# Patient Record
Sex: Female | Born: 1965 | Race: White | Hispanic: No | Marital: Married | Smoking: Never smoker
Health system: Southern US, Community
[De-identification: ages and names within clinical notes are randomized; demographics above are authoritative.]

## PROBLEM LIST (undated history)

## (undated) DIAGNOSIS — M1990 Unspecified osteoarthritis, unspecified site: Secondary | ICD-10-CM

## (undated) DIAGNOSIS — E669 Obesity, unspecified: Secondary | ICD-10-CM

## (undated) DIAGNOSIS — B081 Molluscum contagiosum: Secondary | ICD-10-CM

## (undated) DIAGNOSIS — IMO0002 Reserved for concepts with insufficient information to code with codable children: Secondary | ICD-10-CM

## (undated) DIAGNOSIS — M778 Other enthesopathies, not elsewhere classified: Secondary | ICD-10-CM

## (undated) DIAGNOSIS — K824 Cholesterolosis of gallbladder: Secondary | ICD-10-CM

## (undated) DIAGNOSIS — R569 Unspecified convulsions: Secondary | ICD-10-CM

## (undated) DIAGNOSIS — M797 Fibromyalgia: Secondary | ICD-10-CM

## (undated) DIAGNOSIS — M722 Plantar fascial fibromatosis: Secondary | ICD-10-CM

## (undated) DIAGNOSIS — G8929 Other chronic pain: Secondary | ICD-10-CM

## (undated) DIAGNOSIS — M8580 Other specified disorders of bone density and structure, unspecified site: Secondary | ICD-10-CM

## (undated) DIAGNOSIS — R519 Headache, unspecified: Secondary | ICD-10-CM

## (undated) DIAGNOSIS — I341 Nonrheumatic mitral (valve) prolapse: Secondary | ICD-10-CM

## (undated) DIAGNOSIS — K219 Gastro-esophageal reflux disease without esophagitis: Secondary | ICD-10-CM

## (undated) DIAGNOSIS — M7070 Other bursitis of hip, unspecified hip: Secondary | ICD-10-CM

## (undated) DIAGNOSIS — D6851 Activated protein C resistance: Secondary | ICD-10-CM

## (undated) DIAGNOSIS — J342 Deviated nasal septum: Secondary | ICD-10-CM

## (undated) HISTORY — DX: Other chronic pain: G89.29

## (undated) HISTORY — DX: Plantar fascial fibromatosis: M72.2

## (undated) HISTORY — DX: Other bursitis of hip, unspecified hip: M70.70

## (undated) HISTORY — DX: Other enthesopathies, not elsewhere classified: M77.8

## (undated) HISTORY — DX: Fibromyalgia: M79.7

## (undated) HISTORY — DX: Unspecified osteoarthritis, unspecified site: M19.90

## (undated) HISTORY — DX: Activated protein C resistance: D68.51

## (undated) HISTORY — DX: Molluscum contagiosum: B08.1

## (undated) HISTORY — PX: WISDOM TOOTH EXTRACTION: SHX21

## (undated) HISTORY — DX: Deviated nasal septum: J34.2

## (undated) HISTORY — DX: Cholesterolosis of gallbladder: K82.4

## (undated) HISTORY — DX: Nonrheumatic mitral (valve) prolapse: I34.1

## (undated) HISTORY — DX: Unspecified convulsions: R56.9

## (undated) HISTORY — DX: Headache, unspecified: R51.9

## (undated) HISTORY — DX: Other specified disorders of bone density and structure, unspecified site: M85.80

## (undated) HISTORY — DX: Reserved for concepts with insufficient information to code with codable children: IMO0002

## (undated) HISTORY — DX: Obesity, unspecified: E66.9

---

## 1986-07-24 HISTORY — PX: TEMPOROMANDIBULAR JOINT SURGERY: SHX35

## 1998-05-08 ENCOUNTER — Ambulatory Visit (HOSPITAL_COMMUNITY): Admission: RE | Admit: 1998-05-08 | Discharge: 1998-05-08 | Payer: Self-pay | Admitting: Obstetrics and Gynecology

## 1999-03-14 ENCOUNTER — Inpatient Hospital Stay (HOSPITAL_COMMUNITY): Admission: AD | Admit: 1999-03-14 | Discharge: 1999-03-17 | Payer: Self-pay | Admitting: Gynecology

## 1999-03-14 ENCOUNTER — Encounter (INDEPENDENT_AMBULATORY_CARE_PROVIDER_SITE_OTHER): Payer: Self-pay

## 1999-04-26 ENCOUNTER — Other Ambulatory Visit: Admission: RE | Admit: 1999-04-26 | Discharge: 1999-04-26 | Payer: Self-pay | Admitting: Gynecology

## 2000-06-13 ENCOUNTER — Other Ambulatory Visit: Admission: RE | Admit: 2000-06-13 | Discharge: 2000-06-13 | Payer: Self-pay | Admitting: Family Medicine

## 2001-07-16 ENCOUNTER — Other Ambulatory Visit: Admission: RE | Admit: 2001-07-16 | Discharge: 2001-07-16 | Payer: Self-pay | Admitting: Family Medicine

## 2002-07-08 ENCOUNTER — Encounter: Payer: Self-pay | Admitting: Family Medicine

## 2002-07-08 ENCOUNTER — Ambulatory Visit (HOSPITAL_COMMUNITY): Admission: RE | Admit: 2002-07-08 | Discharge: 2002-07-08 | Payer: Self-pay | Admitting: Family Medicine

## 2002-10-27 ENCOUNTER — Ambulatory Visit (HOSPITAL_COMMUNITY): Admission: RE | Admit: 2002-10-27 | Discharge: 2002-10-27 | Payer: Self-pay | Admitting: Family Medicine

## 2002-10-27 ENCOUNTER — Encounter: Payer: Self-pay | Admitting: Family Medicine

## 2002-11-04 ENCOUNTER — Encounter: Admission: RE | Admit: 2002-11-04 | Discharge: 2002-11-04 | Payer: Self-pay | Admitting: Family Medicine

## 2002-11-04 ENCOUNTER — Encounter: Payer: Self-pay | Admitting: Family Medicine

## 2003-04-28 ENCOUNTER — Encounter: Admission: RE | Admit: 2003-04-28 | Discharge: 2003-04-28 | Payer: Self-pay | Admitting: Family Medicine

## 2003-04-28 ENCOUNTER — Encounter: Payer: Self-pay | Admitting: Family Medicine

## 2003-11-10 ENCOUNTER — Encounter: Admission: RE | Admit: 2003-11-10 | Discharge: 2003-11-10 | Payer: Self-pay | Admitting: Family Medicine

## 2003-11-26 ENCOUNTER — Other Ambulatory Visit: Admission: RE | Admit: 2003-11-26 | Discharge: 2003-11-26 | Payer: Self-pay | Admitting: Family Medicine

## 2004-11-10 ENCOUNTER — Encounter: Admission: RE | Admit: 2004-11-10 | Discharge: 2004-11-10 | Payer: Self-pay | Admitting: Family Medicine

## 2004-12-08 ENCOUNTER — Other Ambulatory Visit: Admission: RE | Admit: 2004-12-08 | Discharge: 2004-12-08 | Payer: Self-pay | Admitting: Family Medicine

## 2005-01-22 ENCOUNTER — Emergency Department: Payer: Self-pay | Admitting: Unknown Physician Specialty

## 2005-11-14 ENCOUNTER — Encounter: Admission: RE | Admit: 2005-11-14 | Discharge: 2005-11-14 | Payer: Self-pay | Admitting: Family Medicine

## 2005-12-22 ENCOUNTER — Other Ambulatory Visit: Admission: RE | Admit: 2005-12-22 | Discharge: 2005-12-22 | Payer: Self-pay | Admitting: Family Medicine

## 2005-12-26 ENCOUNTER — Other Ambulatory Visit: Admission: RE | Admit: 2005-12-26 | Discharge: 2005-12-26 | Payer: Self-pay | Admitting: Obstetrics & Gynecology

## 2006-11-20 ENCOUNTER — Encounter: Admission: RE | Admit: 2006-11-20 | Discharge: 2006-11-20 | Payer: Self-pay | Admitting: Family Medicine

## 2006-12-27 ENCOUNTER — Other Ambulatory Visit: Admission: RE | Admit: 2006-12-27 | Discharge: 2006-12-27 | Payer: Self-pay | Admitting: Obstetrics & Gynecology

## 2007-01-03 ENCOUNTER — Ambulatory Visit (HOSPITAL_COMMUNITY): Admission: RE | Admit: 2007-01-03 | Discharge: 2007-01-03 | Payer: Self-pay | Admitting: Obstetrics & Gynecology

## 2007-01-17 ENCOUNTER — Encounter: Admission: RE | Admit: 2007-01-17 | Discharge: 2007-01-17 | Payer: Self-pay | Admitting: Obstetrics & Gynecology

## 2007-11-25 ENCOUNTER — Encounter: Admission: RE | Admit: 2007-11-25 | Discharge: 2007-11-25 | Payer: Self-pay | Admitting: Family Medicine

## 2008-01-15 ENCOUNTER — Other Ambulatory Visit: Admission: RE | Admit: 2008-01-15 | Discharge: 2008-01-15 | Payer: Self-pay | Admitting: Obstetrics and Gynecology

## 2008-11-25 ENCOUNTER — Encounter: Admission: RE | Admit: 2008-11-25 | Discharge: 2008-11-25 | Payer: Self-pay | Admitting: Family Medicine

## 2010-01-12 ENCOUNTER — Encounter: Admission: RE | Admit: 2010-01-12 | Discharge: 2010-01-12 | Payer: Self-pay | Admitting: Family Medicine

## 2010-02-08 ENCOUNTER — Emergency Department (HOSPITAL_COMMUNITY): Admission: EM | Admit: 2010-02-08 | Discharge: 2010-02-08 | Payer: Self-pay | Admitting: Emergency Medicine

## 2010-08-14 ENCOUNTER — Encounter: Payer: Self-pay | Admitting: Family Medicine

## 2010-10-08 LAB — POCT CARDIAC MARKERS
CKMB, poc: <1 ng/mL — ABNORMAL LOW (ref 1.0–8.0)
CKMB, poc: <1 ng/mL — ABNORMAL LOW (ref 1.0–8.0)
CKMB, poc: <1 ng/mL — ABNORMAL LOW (ref 1.0–8.0)
Myoglobin, poc: 28 ng/mL (ref 12–200)
Myoglobin, poc: 51.7 ng/mL (ref 12–200)
Myoglobin, poc: 58.6 ng/mL (ref 12–200)
Troponin i, poc: <0.05 ng/mL (ref 0.00–0.09)
Troponin i, poc: <0.05 ng/mL (ref 0.00–0.09)
Troponin i, poc: <0.05 ng/mL (ref 0.00–0.09)

## 2010-10-08 LAB — CBC
HCT: 43.1 % (ref 36.0–46.0)
Hemoglobin: 14.7 g/dL (ref 12.0–15.0)
MCH: 31.8 pg (ref 26.0–34.0)
MCHC: 34.2 g/dL (ref 30.0–36.0)
MCV: 93.1 fL (ref 78.0–100.0)
Platelets: 261 10*3/uL (ref 150–400)
RBC: 4.63 MIL/uL (ref 3.87–5.11)
RDW: 13.1 % (ref 11.5–15.5)
WBC: 6.9 10*3/uL (ref 4.0–10.5)

## 2010-10-08 LAB — DIFFERENTIAL
Basophils Absolute: 0.1 10*3/uL (ref 0.0–0.1)
Basophils Relative: 1 % (ref 0–1)
Eosinophils Absolute: 0.1 10*3/uL (ref 0.0–0.7)
Eosinophils Relative: 1 % (ref 0–5)
Lymphocytes Relative: 16 % (ref 12–46)
Lymphs Abs: 1.1 10*3/uL (ref 0.7–4.0)
Monocytes Absolute: 0.4 10*3/uL (ref 0.1–1.0)
Monocytes Relative: 6 % (ref 3–12)
Neutro Abs: 5.2 10*3/uL (ref 1.7–7.7)
Neutrophils Relative %: 75 % (ref 43–77)

## 2010-10-08 LAB — BASIC METABOLIC PANEL
BUN: 13 mg/dL (ref 6–23)
CO2: 21 meq/L (ref 19–32)
Calcium: 8.9 mg/dL (ref 8.4–10.5)
Chloride: 109 meq/L (ref 96–112)
Creatinine, Ser: 0.69 mg/dL (ref 0.4–1.2)
GFR calc Af Amer: >60 mL/min (ref >60)
GFR calc non Af Amer: >60 mL/min (ref >60)
Glucose, Bld: 93 mg/dL (ref 70–99)
Potassium: 4.2 meq/L (ref 3.5–5.1)
Sodium: 137 meq/L (ref 135–145)

## 2010-10-08 LAB — D-DIMER, QUANTITATIVE: D-Dimer, Quant: 0.4 ug/mL-FEU (ref 0.00–0.48)

## 2010-12-20 ENCOUNTER — Emergency Department (HOSPITAL_COMMUNITY): Admission: EM | Admit: 2010-12-20 | Payer: Self-pay | Source: Home / Self Care

## 2010-12-20 ENCOUNTER — Other Ambulatory Visit: Payer: Self-pay | Admitting: Family Medicine

## 2010-12-20 DIAGNOSIS — Z1231 Encounter for screening mammogram for malignant neoplasm of breast: Secondary | ICD-10-CM

## 2011-01-16 ENCOUNTER — Ambulatory Visit
Admission: RE | Admit: 2011-01-16 | Discharge: 2011-01-16 | Disposition: A | Discharge status: Home / Self Care | Payer: BC Managed Care – PPO | Source: Ambulatory Visit | Attending: Family Medicine | Admitting: Family Medicine

## 2011-01-16 DIAGNOSIS — Z1231 Encounter for screening mammogram for malignant neoplasm of breast: Secondary | ICD-10-CM

## 2011-04-04 ENCOUNTER — Encounter: Payer: BC Managed Care – PPO | Attending: Obstetrics and Gynecology | Admitting: *Deleted

## 2011-04-04 ENCOUNTER — Encounter: Payer: Self-pay | Admitting: *Deleted

## 2011-04-04 DIAGNOSIS — Z713 Dietary counseling and surveillance: Secondary | ICD-10-CM | POA: Insufficient documentation

## 2011-04-04 DIAGNOSIS — E669 Obesity, unspecified: Secondary | ICD-10-CM | POA: Insufficient documentation

## 2011-04-04 NOTE — Patient Instructions (Addendum)
Goals:  Eat 3 meals/day, Avoid meal skipping   Increase protein rich foods  Count carbohydrates using carbohydrate exchanges  Limit carbohydrate 30-45 grams/meal, 15-20 grams/snack   Aim for >30 min of physical activity daily  Limit sugar-sweetened beverages and concentrated sweets

## 2011-04-04 NOTE — Progress Notes (Signed)
  Medical Nutrition Therapy:  Appt start time: 0930 end time:  1030.   Assessment:  Primary concerns today: weight management. Pt reports that her usual body weight is 145 lbs yet in the last 6 years she has gained about 55 lbs. Pt reports that she has not participated in any notable weight loss methods (diets, pills, etc). She reports that she previously had been able to manage her weight with being active and exercise yet due to her busy schedule she is unable to do so. She works PT as an Airline pilot.  MEDICATIONS: See updated medication list. Pt takes medication for allergies, GERD, and pain (prn) as well as supplements.   DIETARY INTAKE:  Usual eating pattern includes 2-3 meals and 0-1 snacks per day.  24-hr recall:  B (9-9:30 AM): Nutrigrain bar  Snk (AM): N/A  L (PM): SKIPS (75%) OR Grapes, Side Salad OR Kid's Meal Cheeseburger w/ Donzetta Sprung Snk (PM): N/A D (6-9 PM): [employer] Veggie Plate OR Steak Kabob w/ vegetables OR Grilled chicken, potatoes, salad, green beans Snk (PM): SKIPS OR Popcorn Beverages: Water, Sweet Tea  Usual physical activity: Low activity levels yet due to bursitis she is unable to exercise much on her treadmill  Estimated energy needs: 1600-1800 calories 180-200 g carbohydrates 80-100 g protein 50-60 g fat  Per referring physician labs reported as follows: Cholesterol: 189 LDL: 110 HDL: 62 TG: 43  Progress Towards Goal(s):  In progress.   Nutritional Diagnosis:  NI-1.4 Inadequate energy intake As related to frequent meal skipping.  As evidenced by pt consuming <500 calories per day.    Intervention:  Nutrition education.  Handouts given during visit include:  Meal Planner; "Yellow Card"  15 gram Carbohydrate Snack Sheet  Monitoring/Evaluation:  Dietary intake, exercise, carbohydrate intake, and body weight in 1 month(s).

## 2011-05-04 ENCOUNTER — Ambulatory Visit: Payer: BC Managed Care – PPO | Admitting: *Deleted

## 2011-05-11 LAB — BLEEDING TIME: Bleeding Time: 2.5

## 2011-05-11 LAB — CBC
HCT: 40.3
Hemoglobin: 13.9
MCHC: 34.4
MCV: 90.4
Platelets: 231
RBC: 4.46
RDW: 12.8
WBC: 5.2

## 2011-05-11 LAB — TSH: TSH: 0.858

## 2011-07-25 DIAGNOSIS — M7918 Myalgia, other site: Secondary | ICD-10-CM

## 2011-07-25 DIAGNOSIS — M797 Fibromyalgia: Secondary | ICD-10-CM

## 2011-07-25 HISTORY — DX: Myalgia, other site: M79.18

## 2011-07-25 HISTORY — DX: Fibromyalgia: M79.7

## 2012-01-05 ENCOUNTER — Other Ambulatory Visit: Payer: Self-pay | Admitting: Family Medicine

## 2012-01-05 DIAGNOSIS — Z1231 Encounter for screening mammogram for malignant neoplasm of breast: Secondary | ICD-10-CM

## 2012-01-19 ENCOUNTER — Ambulatory Visit: Payer: BC Managed Care – PPO

## 2012-01-22 ENCOUNTER — Ambulatory Visit: Payer: BC Managed Care – PPO

## 2012-01-29 ENCOUNTER — Ambulatory Visit
Admission: RE | Admit: 2012-01-29 | Discharge: 2012-01-29 | Disposition: A | Discharge status: Home / Self Care | Payer: BC Managed Care – PPO | Source: Ambulatory Visit | Attending: Family Medicine | Admitting: Family Medicine

## 2012-01-29 DIAGNOSIS — Z1231 Encounter for screening mammogram for malignant neoplasm of breast: Secondary | ICD-10-CM

## 2012-01-29 LAB — HM MAMMOGRAPHY: HM Mammogram: NORMAL

## 2012-03-24 DIAGNOSIS — IMO0002 Reserved for concepts with insufficient information to code with codable children: Secondary | ICD-10-CM

## 2012-03-24 HISTORY — DX: Reserved for concepts with insufficient information to code with codable children: IMO0002

## 2012-11-06 ENCOUNTER — Encounter: Payer: Self-pay | Admitting: Obstetrics & Gynecology

## 2012-11-06 ENCOUNTER — Ambulatory Visit: Payer: Self-pay | Admitting: Obstetrics & Gynecology

## 2012-11-07 ENCOUNTER — Ambulatory Visit: Payer: Self-pay | Admitting: Obstetrics & Gynecology

## 2012-11-15 ENCOUNTER — Encounter: Payer: Self-pay | Admitting: Obstetrics & Gynecology

## 2012-11-19 ENCOUNTER — Ambulatory Visit: Payer: Self-pay | Admitting: Obstetrics & Gynecology

## 2012-11-20 ENCOUNTER — Ambulatory Visit (INDEPENDENT_AMBULATORY_CARE_PROVIDER_SITE_OTHER): Payer: BC Managed Care – PPO | Admitting: Obstetrics & Gynecology

## 2012-11-20 ENCOUNTER — Ambulatory Visit: Payer: Self-pay | Admitting: Obstetrics & Gynecology

## 2012-11-20 ENCOUNTER — Encounter: Payer: Self-pay | Admitting: Obstetrics & Gynecology

## 2012-11-20 VITALS — BP 104/76 | HR 40 | Resp 16

## 2012-11-20 DIAGNOSIS — R87810 Cervical high risk human papillomavirus (HPV) DNA test positive: Secondary | ICD-10-CM

## 2012-11-20 DIAGNOSIS — N870 Mild cervical dysplasia: Secondary | ICD-10-CM

## 2012-11-20 NOTE — Progress Notes (Signed)
46 yrs  MWF G3P0012here for repeat pap smear due to h/o +HR HPV.  +HR HPV noted on screening pap smear in 9/13.  Pap was normal.  16/18 testing was done.  18 was positive.  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  ECC was negative.   Denies problems or vaginal symptoms or STD concerns.  LMP:  Having perimenopausal bleeding with just occasional spotting. Contraception:vasectomy  General appearance: Healthy female    Exam:Pelvic exam: normal external genitalia, vulva, vagina, cervix, uterus and adnexa, VULVA: normal appearing vulva with no masses, tenderness or lesions, VAGINA: normal appearing vagina with normal color and discharge, no lesions, CERVIX: normal appearing cervix without discharge or lesions, UTERUS: uterus is normal size, shape, consistency and nontender, ADNEXA: normal adnexa in size, nontender and no masses.Pap smear obtained.  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  If negative, will repeat Pap and HR HPV in six months around time of [facility].   Marland Kitchen

## 2012-11-20 NOTE — Patient Instructions (Signed)
We will call with Pap smear results and recommendations for follow-up.

## 2012-11-22 LAB — IPS PAP SMEAR ONLY

## 2012-12-30 ENCOUNTER — Other Ambulatory Visit: Payer: Self-pay

## 2012-12-30 DIAGNOSIS — Z1231 Encounter for screening mammogram for malignant neoplasm of breast: Secondary | ICD-10-CM

## 2013-01-30 ENCOUNTER — Ambulatory Visit
Admission: RE | Admit: 2013-01-30 | Discharge: 2013-01-30 | Disposition: A | Discharge status: Home / Self Care | Payer: BC Managed Care – PPO | Source: Ambulatory Visit

## 2013-01-30 DIAGNOSIS — Z1231 Encounter for screening mammogram for malignant neoplasm of breast: Secondary | ICD-10-CM

## 2013-04-15 ENCOUNTER — Ambulatory Visit: Payer: Self-pay | Admitting: Certified Nurse Midwife

## 2013-05-02 ENCOUNTER — Ambulatory Visit (INDEPENDENT_AMBULATORY_CARE_PROVIDER_SITE_OTHER): Payer: BC Managed Care – PPO | Admitting: Obstetrics & Gynecology

## 2013-05-02 ENCOUNTER — Encounter: Payer: Self-pay | Admitting: Obstetrics & Gynecology

## 2013-05-02 VITALS — BP 118/74 | HR 58 | Resp 16 | Ht 67.75 in | Wt 206.4 lb

## 2013-05-02 DIAGNOSIS — Z0000 Encounter for general adult medical examination without abnormal findings: Secondary | ICD-10-CM

## 2013-05-02 DIAGNOSIS — B977 Papillomavirus as the cause of diseases classified elsewhere: Secondary | ICD-10-CM

## 2013-05-02 DIAGNOSIS — Z1211 Encounter for screening for malignant neoplasm of colon: Secondary | ICD-10-CM

## 2013-05-02 DIAGNOSIS — Z01419 Encounter for gynecological examination (general) (routine) without abnormal findings: Secondary | ICD-10-CM

## 2013-05-02 DIAGNOSIS — Z124 Encounter for screening for malignant neoplasm of cervix: Secondary | ICD-10-CM

## 2013-05-02 LAB — COMPREHENSIVE METABOLIC PANEL
ALT: 14 U/L (ref 0–35)
AST: 15 U/L (ref 0–37)
Albumin: 4.1 g/dL (ref 3.5–5.2)
Alkaline Phosphatase: 151 U/L — ABNORMAL HIGH (ref 39–117)
BUN: 15 mg/dL (ref 6–23)
CO2: 30 mEq/L (ref 19–32)
Calcium: 9 mg/dL (ref 8.4–10.5)
Chloride: 106 meq/L (ref 96–112)
Creat: 0.79 mg/dL (ref 0.50–1.10)
Glucose, Bld: 73 mg/dL (ref 70–99)
Potassium: 3.8 meq/L (ref 3.5–5.3)
Sodium: 140 mEq/L (ref 135–145)
Total Bilirubin: 0.6 mg/dL (ref 0.3–1.2)
Total Protein: 6.5 g/dL (ref 6.0–8.3)

## 2013-05-02 LAB — LIPID PANEL
Cholesterol: 182 mg/dL (ref 0–200)
HDL: 58 mg/dL (ref >39)
LDL Cholesterol: 111 mg/dL — ABNORMAL HIGH (ref 0–99)
Total CHOL/HDL Ratio: 3.1 {ratio}
Triglycerides: 66 mg/dL (ref <150)
VLDL: 13 mg/dL (ref 0–40)

## 2013-05-02 LAB — TSH: TSH: 0.7 u[IU]/mL (ref 0.350–4.500)

## 2013-05-02 MED ORDER — VITAMIN D (ERGOCALCIFEROL) 1.25 MG (50000 UNIT) PO CAPS: 50000.0000 [IU] | ORAL_CAPSULE | ORAL | Status: DC

## 2013-05-02 NOTE — Patient Instructions (Signed)

## 2013-05-02 NOTE — Progress Notes (Addendum)
47 y.o. [id number] MarriedCaucasianF here for annual exam.  Had spotting in May and June.  She reports this was a little bit more like a period.  Lasted for 7 days.  Flow was moderate.  Endometrial biopsy 11/13 was inactive endometrium--benign.  H/O abnormal Pap with +16/18.  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  Sister with pre-cancerous colon polyps and had resection of 12 inches of colon.  She is 46.  Saw Dr. Juanda Chance and Dr. Michaell Cowing.    Patient's last menstrual period was 12/22/2012.          Sexually active: yes  The current method of family planning is vasectomy.    Exercising: yes  some, diag with bilateral hip bursitis Smoker:  no  Health Maintenance: Pap:  04/10/12 WNL/positive HR HPV-18 detected History of abnormal Pap:  yes MMG: 01/30/13 3D normal Colonoscopy:  none BMD:   none TDaP:  7/06 Screening Labs: today, Hb today: 13.6, Urine today: negative   reports that she has never smoked. She has never used smokeless tobacco. She reports that she drinks alcohol. She reports that she does not use illicit drugs.  Past Medical History  Diagnosis Date  . Obesity   . Factor V Leiden   . Molluscum contagiosum   . HPV test positive 9/13    neg Pap, +16/18 HPV  . Plantar fasciitis   . Tendonitis of elbow, right   . Fibromyalgia 2013  . Bursitis of hip     bilateral    Past Surgical History  Procedure Laterality Date  . Cesarean section  1997, 2000    Current Outpatient Prescriptions  Medication Sig Dispense Refill  . cetirizine (ZYRTEC) 10 MG tablet Take 10 mg by mouth daily.        . methocarbamol (ROBAXIN) 500 MG tablet Take 500 mg by mouth 2 (two) times daily.      . Multiple Vitamins-Minerals (MULTIVITAMIN WITH MINERALS) tablet Take 1 tablet by mouth daily.        . nadolol (CORGARD) 40 MG tablet Take 40 mg by mouth daily.        Marland Kitchen omeprazole (PRILOSEC) 20 MG capsule Take 20 mg by mouth daily.        . Vitamin D, Ergocalciferol, (DRISDOL) 50000 UNITS CAPS Take 50,000 Units by  mouth every 14 (fourteen) days.      Marland Kitchen b complex vitamins tablet Take 1 tablet by mouth daily.        . cyclobenzaprine (FLEXERIL) 10 MG tablet Take 10 mg by mouth 3 (three) times daily as needed.        Marland Kitchen ibuprofen (ADVIL,MOTRIN) 100 MG/5ML suspension Take 200 mg by mouth every 8 (eight) hours as needed.        . traMADol (ULTRAM) 50 MG tablet Take 50 mg by mouth every 6 (six) hours as needed.         No current facility-administered medications for this visit.    Family History  Problem Relation Age of Onset  . Colon cancer Sister     pre-colon cancer-had surgery  . Heart disease Sister     pacemaker, POT syndrome  . Diabetes Father   . COPD Mother   . Rheum arthritis Mother   . Hypertension Father   . Hyperlipidemia Father   . Hyperlipidemia Mother     ROS:  Pertinent items are noted in HPI.  Otherwise, a comprehensive ROS was negative.  Exam:   BP 118/74  Pulse 58  Resp 16  Ht  5' 7.75" (1.721 m)  Wt 206 lb 6.4 oz (93.622 kg)  BMI 31.61 kg/m2  LMP 12/22/2012  Weight change: +6lb   Height: 5' 7.75" (172.1 cm)  Ht Readings from Last 3 Encounters:  05/02/13 5' 7.75" (1.721 m)  04/04/11 5' 7.5" (1.715 m)    General appearance: alert, cooperative and appears stated age Head: Normocephalic, without obvious abnormality, atraumatic Neck: no adenopathy, supple, symmetrical, trachea midline and thyroid normal to inspection and palpation Lungs: clear to auscultation bilaterally Breasts: normal appearance, no masses or tenderness Heart: regular rate and rhythm Abdomen: soft, non-tender; bowel sounds normal; no masses,  no organomegaly Extremities: extremities normal, atraumatic, no cyanosis or edema Skin: Skin color, texture, turgor normal. No rashes or lesions Lymph nodes: Cervical, supraclavicular, and axillary nodes normal. No abnormal inguinal nodes palpated Neurologic: Grossly normal   Pelvic: External genitalia:  no lesions              Urethra:  normal appearing  urethra with no masses, tenderness or lesions              Bartholins and Skenes: normal                 Vagina: normal appearing vagina with normal color and discharge, no lesions              Cervix: no lesions              Pap taken: yes Bimanual Exam:  Uterus:  normal size, contour, position, consistency, mobility, non-tender              Adnexa: normal adnexa and no mass, fullness, tenderness               Rectovaginal: Confirms               Anus:  normal sphincter tone, no lesions  A:  Well Woman with normal exam Perimenopausal bleeding, neg Endometrial biopsy 11/13 H/O Factor V Leiden Fibromyalgia Vit D deficiency Sister, age 66, with pre-cancerous polyps with recent partial colectomy  P:   Mammogram yearly pap smear with HR HPV today Vit D, TSH, CMP, Lipids, Continue Vit D 50,000IU every 14 days.  Rx to pharmacy. Referral to Dr. Juanda Chance for colonoscopy. return annually or prn  An After Visit Summary was printed and given to the patient.

## 2013-05-03 LAB — VITAMIN D 25 HYDROXY (VIT D DEFICIENCY, FRACTURES): Vit D, 25-Hydroxy: 43 ng/mL (ref 30–89)

## 2013-05-05 LAB — HEMOGLOBIN, FINGERSTICK: Hemoglobin, fingerstick: 13.6 g/dL (ref 12.0–16.0)

## 2013-05-06 LAB — IPS PAP TEST WITH HPV

## 2013-05-09 ENCOUNTER — Telehealth: Payer: Self-pay

## 2013-05-09 NOTE — Telephone Encounter (Signed)
Message copied by Elisha Headland on Fri May 09, 2013  3:10 PM ------      Message from: DARENE, NAPPI      Created: Sat May 03, 2013  5:51 AM       Pap and hr hpv pending.  Inform tsh, cmp, and lipids all good.  Vit D 43.  Cont current dosage.  rx sent to pharmacy at [facility]. ------

## 2013-05-09 NOTE — Addendum Note (Signed)
Addended by: Jerene Bears on: 05/09/2013 04:05 PM   Modules accepted: Orders

## 2013-05-09 NOTE — Telephone Encounter (Signed)
Lmtcb//kn 

## 2013-05-09 NOTE — Telephone Encounter (Signed)
Returning a call to [name] °

## 2013-05-10 LAB — IPS HPV GENOTYPING 16/18

## 2013-05-12 NOTE — Telephone Encounter (Signed)
Pt can be reached at this number([phone]) before 1pm today

## 2013-05-12 NOTE — Telephone Encounter (Signed)
Pt is returning a call to [name]. °

## 2013-05-13 NOTE — Telephone Encounter (Signed)
Message copied by Alfredo Batty on Tue May 13, 2013  1:49 PM ------      Message from: Jerene Bears      Created: Mon May 12, 2013  8:16 AM       Please inform pap normal but HPV + again.  Also + for 18.  Needs colpo again.  Did this last year.  Pt will have good understanding of these results. ------

## 2013-05-13 NOTE — Telephone Encounter (Signed)
Patient is retuning amy's call if before 1 call work if after call home #

## 2013-05-13 NOTE — Telephone Encounter (Signed)
LMTCB for results. aa 

## 2013-05-13 NOTE — Telephone Encounter (Signed)
Spoke with pt about lab results TSH, CMP, lipids, Vit D, and Pap. Advised pt to continue same Vit D dose, and Rx sent to pharmacy at her [facility]. Advised + HR HPV # 18, and that Dr. Hyacinth Meeker recommends a repeat colpo. Sched appt 05-19-13 at 4 pm per pt request. Advised pt to take 800 mg of ibuprofen an hour before with food. Pt agreeable.

## 2013-05-13 NOTE — Progress Notes (Signed)
Pt advised of all lab results. Colpo sched. See phone note 05-13-13.

## 2013-05-16 NOTE — Addendum Note (Signed)
Addended by: Jerene Bears on: 05/16/2013 06:21 AM   Modules accepted: Orders

## 2013-05-19 ENCOUNTER — Ambulatory Visit (INDEPENDENT_AMBULATORY_CARE_PROVIDER_SITE_OTHER): Payer: BC Managed Care – PPO | Admitting: Obstetrics & Gynecology

## 2013-05-19 VITALS — BP 108/76 | HR 58 | Resp 16 | Ht 67.75 in | Wt 207.6 lb

## 2013-05-19 DIAGNOSIS — B977 Papillomavirus as the cause of diseases classified elsewhere: Secondary | ICD-10-CM | POA: Insufficient documentation

## 2013-05-19 NOTE — Patient Instructions (Signed)

## 2013-05-19 NOTE — Progress Notes (Signed)
Subjective:     Patient ID: Brianna [name], female   DOB: 1965-08-01, 47 y.o.   MRN: [id number]  HPI 47 yo G3P2 MWF with h/o normal pap obtained 05/02/13 at [facility] but + HR HPV with + 16/18 testing here for colposcopy.  Same thing seen one year ago.  Then, colpo was showed small area of AWE.  Biopsy showed CIN 1.  Ecc negative.  Had six month f/u pap LGSIL.  Review of Systems  All other systems reviewed and are negative.       Objective:   Physical Exam  Genitourinary:           Speculum inserted atraumatically and cervix visualized.  3% acetic acid applied.  Cervix examined using 3.75 and 7.5   X magnification and green filter.  Lugol's used to stained cervix.  No AWE or abnormal staining with Lugol's noted.  No biopsies obtained.  Ecc obtained after cervix was dilated with Milex dilator.   Assessment:     Normal pap with + HR HPV, +16/18 testing     Plan:     ECC pending.  If neg, repeat Pap and HR HPV 1 year.

## 2013-05-20 NOTE — Addendum Note (Signed)
Addended by: Jerene Bears on: 05/20/2013 12:20 PM   Modules accepted: Orders

## 2013-05-22 LAB — IPS CERVICAL/ECC/EMB/VULVAR/VAGINAL BIOPSY

## 2013-05-26 ENCOUNTER — Telehealth: Payer: Self-pay | Admitting: Obstetrics & Gynecology

## 2013-05-26 NOTE — Telephone Encounter (Signed)
Advised of message below from Dr. Hyacinth Meeker. Patient verbalized understanding and will follow up in one year for pap/hpv testings.

## 2013-05-26 NOTE — Telephone Encounter (Signed)
Notes Recorded by Annamaria Boots, MD on 05/23/2013 at 2:52 PM Inform ECC neg. Repeat Pap and HR HPV 1 year. Recall needs to be placed. Appt needs to be with me. Thanks.

## 2013-05-26 NOTE — Telephone Encounter (Signed)
Results for biopsy from 05/19/13?

## 2013-05-29 ENCOUNTER — Other Ambulatory Visit: Payer: Self-pay

## 2013-06-11 ENCOUNTER — Encounter: Payer: Self-pay | Admitting: Obstetrics & Gynecology

## 2013-06-30 ENCOUNTER — Encounter: Payer: Self-pay | Admitting: Internal Medicine

## 2013-08-20 ENCOUNTER — Ambulatory Visit (AMBULATORY_SURGERY_CENTER): Payer: BC Managed Care – PPO | Admitting: *Deleted

## 2013-08-20 VITALS — Ht 69.0 in | Wt 205.4 lb

## 2013-08-20 DIAGNOSIS — Z1211 Encounter for screening for malignant neoplasm of colon: Secondary | ICD-10-CM

## 2013-08-20 MED ORDER — MOVIPREP 100 G PO SOLR: ORAL | Status: DC

## 2013-08-20 NOTE — Progress Notes (Signed)
No allergies to eggs or soy. No problems with anesthesia.  

## 2013-08-25 ENCOUNTER — Encounter: Payer: Self-pay | Admitting: Internal Medicine

## 2013-09-03 ENCOUNTER — Encounter: Payer: BC Managed Care – PPO | Admitting: Internal Medicine

## 2013-10-09 ENCOUNTER — Encounter: Payer: BC Managed Care – PPO | Admitting: Internal Medicine

## 2013-11-12 ENCOUNTER — Encounter: Payer: BC Managed Care – PPO | Admitting: Internal Medicine

## 2013-12-31 ENCOUNTER — Telehealth: Payer: Self-pay | Admitting: Internal Medicine

## 2013-12-31 NOTE — Telephone Encounter (Signed)
Please charge late cancellation fee 

## 2014-01-02 ENCOUNTER — Encounter: Payer: BC Managed Care – PPO | Admitting: Internal Medicine

## 2014-01-16 ENCOUNTER — Other Ambulatory Visit: Payer: Self-pay

## 2014-01-16 DIAGNOSIS — Z1231 Encounter for screening mammogram for malignant neoplasm of breast: Secondary | ICD-10-CM

## 2014-02-10 ENCOUNTER — Ambulatory Visit: Payer: BC Managed Care – PPO

## 2014-02-19 ENCOUNTER — Ambulatory Visit
Admission: RE | Admit: 2014-02-19 | Discharge: 2014-02-19 | Disposition: A | Discharge status: Home / Self Care | Payer: BC Managed Care – PPO | Source: Ambulatory Visit

## 2014-02-19 DIAGNOSIS — Z1231 Encounter for screening mammogram for malignant neoplasm of breast: Secondary | ICD-10-CM

## 2014-03-11 ENCOUNTER — Encounter: Payer: BC Managed Care – PPO | Admitting: Internal Medicine

## 2014-04-06 ENCOUNTER — Encounter: Payer: BC Managed Care – PPO | Admitting: Internal Medicine

## 2014-05-21 ENCOUNTER — Ambulatory Visit: Payer: BC Managed Care – PPO | Admitting: Obstetrics & Gynecology

## 2014-06-08 ENCOUNTER — Encounter: Payer: Self-pay | Admitting: Nurse Practitioner

## 2014-06-08 ENCOUNTER — Ambulatory Visit (INDEPENDENT_AMBULATORY_CARE_PROVIDER_SITE_OTHER): Payer: BC Managed Care – PPO | Admitting: Nurse Practitioner

## 2014-06-08 VITALS — BP 100/64 | HR 60 | Ht 67.25 in | Wt 209.0 lb

## 2014-06-08 DIAGNOSIS — Z0000 Encounter for general adult medical examination without abnormal findings: Secondary | ICD-10-CM

## 2014-06-08 DIAGNOSIS — Z01419 Encounter for gynecological examination (general) (routine) without abnormal findings: Secondary | ICD-10-CM

## 2014-06-08 LAB — POCT URINALYSIS DIPSTICK
Bilirubin, UA: NEGATIVE
Blood, UA: NEGATIVE
Glucose, UA: NEGATIVE
Ketones, UA: NEGATIVE
Leukocytes, UA: NEGATIVE
Nitrite, UA: NEGATIVE
Protein, UA: NEGATIVE
Urobilinogen, UA: 1
pH, UA: 7

## 2014-06-08 MED ORDER — VITAMIN D (ERGOCALCIFEROL) 1.25 MG (50000 UNIT) PO CAPS: 50000.0000 [IU] | ORAL_CAPSULE | ORAL | Status: DC

## 2014-06-08 NOTE — Progress Notes (Signed)
Patient ID: Brianna [name], female  DOB: 05/27/1966, 48 y.o.   MRN: [id number] 48 yo G3P0012 Married Caucasian Fe here for annual exam.  Sees Brianna [name] for arthritis and bursitis in left hip.  She had previous apt. with Brianna [name] and son had to have surgery and she rescheduled.  Patient's last menstrual period was 12/22/2012.          Sexually active: yes  The current method of family planning is vasectomy.  Exercising: yes, walking Smoker: no   Health Maintenance:  Pap: 05/02/13, negative/pos HR HPV-18 detected;  05/19/13 Colpo, neg ECC;  04/10/12 WNL/positive HR HPV-18 detected  History of abnormal Pap: yes  MMG: 02/19/14, 3D, Bi-Rads 1:  Negative  Colonoscopy: none but will schedule TDaP: 7/06 Labs:  HB:  ? Brianna [name]  Urine:  1 urobilinogen    reports that she has never smoked. She has never used smokeless tobacco. She reports that she drinks alcohol. She reports that she does not use illicit drugs.  Past Medical History  Diagnosis Date  . Obesity   . Factor V Leiden   . Molluscum contagiosum   . HPV test positive 9/13    neg Pap, +16/18 HPV  . Plantar fasciitis   . Tendonitis of elbow, right   . Fibromyalgia 2013  . Bursitis of hip     bilateral  . Mitral valve prolapse     Past Surgical History  Procedure Laterality Date  . Cesarean section  1997, 2000  . Temporomandibular joint surgery  1988    Current Outpatient Prescriptions  Medication Sig Dispense Refill  . cetirizine (ZYRTEC) 10 MG tablet Take 10 mg by mouth daily.      . fluticasone (VERAMYST) 27.5 MCG/SPRAY nasal spray Place 2 sprays into the nose daily.    Marland Kitchen. ibuprofen (ADVIL,MOTRIN) 100 MG/5ML suspension Take 200 mg by mouth every 8 (eight) hours as needed.      . methocarbamol (ROBAXIN) 500 MG tablet Take 500 mg by mouth 2 (two) times daily.    . Multiple Vitamins-Minerals (MULTIVITAMIN WITH MINERALS) tablet Take 1 tablet by mouth daily.      . nadolol (CORGARD) 40 MG tablet Take 40 mg by mouth  daily.      Marland Kitchen. omeprazole (PRILOSEC) 20 MG capsule Take 20 mg by mouth daily.      . Probiotic Product (PROBIOTIC DAILY PO) Take by mouth daily.    . traMADol (ULTRAM) 50 MG tablet Take 50 mg by mouth every 6 (six) hours as needed.      . Vitamin D, Ergocalciferol, (DRISDOL) 50000 UNITS CAPS capsule Take 1 capsule (50,000 Units total) by mouth every 14 (fourteen) days. 30 capsule 1   No current facility-administered medications for this visit.    Family History  Problem Relation Age of Onset  . Colon cancer Sister 3046    pre-colon cancer-had surgery  . Heart disease Sister     pacemaker, POT syndrome  . Diabetes Father   . Hypertension Father   . Hyperlipidemia Father   . COPD Mother   . Rheum arthritis Mother   . Hyperlipidemia Mother     ROS:  Pertinent items are noted in HPI.  Otherwise, a comprehensive ROS was negative.  Exam:   BP 100/64 mmHg  Pulse 60  Ht 5' 7.25" (1.708 m)  Wt 209 lb (94.802 kg)  BMI 32.50 kg/m2  LMP 12/22/2012 Height: 5' 7.25" (170.8 cm)  Ht Readings from Last 3 Encounters:  06/08/14 5' 7.25" (1.708  m)  08/20/13 5\' 9"  (1.753 m)  05/19/13 5' 7.75" (1.721 m)    General appearance: alert, cooperative and appears stated age Head: Normocephalic, without obvious abnormality, atraumatic Neck: no adenopathy, supple, symmetrical, trachea midline and thyroid normal to inspection and palpation Lungs: clear to auscultation bilaterally Breasts: normal appearance, no masses or tenderness Heart: regular rate and rhythm Abdomen: soft, non-tender; no masses,  no organomegaly Extremities: extremities normal, atraumatic, no cyanosis or edema Skin: Skin color, texture, turgor normal. No rashes or lesions Lymph nodes: Cervical, supraclavicular, and axillary nodes normal. No abnormal inguinal nodes palpated Neurologic: Grossly normal   Pelvic: External genitalia:  no lesions              Urethra:  normal appearing urethra with no masses, tenderness or lesions               Bartholin's and Skene's: normal                 Vagina: normal appearing vagina with normal color and discharge, no lesions              Cervix: anteverted              Pap taken: Yes.   Bimanual Exam:  Uterus:  normal size, contour, position, consistency, mobility, non-tender              Adnexa: no mass, fullness, tenderness               Rectovaginal: Confirms               Anus:  normal sphincter tone, no lesions  A:  Well Woman with normal exam  Postmenopausal - no vaso symptoms  History of abnormal HR HPV with negative Colpo biopsy 05/19/13  Perimenopausal bleeding, neg Endometrial biopsy 11/13  H/O Factor V Leiden  Fibromyalgia, bursitis left hip  Vit D deficiency  Sister, age 50, with pre-cancerous polyps with recent partial colectomy  P:   Reviewed health and wellness pertinent to exam  Pap smear taken today and will follow with Brianna [name]  Mammogram is due 7/16  Refill on Vit D for a year  Will return for fasting labs  Counseled on breast self exam, mammography screening, adequate intake of calcium and vitamin D, diet and exercise, Kegel's exercises return annually or prn  An After Visit Summary was printed and given to the patient.

## 2014-06-08 NOTE — Patient Instructions (Addendum)

## 2014-06-09 ENCOUNTER — Encounter: Payer: Self-pay | Admitting: Internal Medicine

## 2014-06-09 NOTE — Progress Notes (Signed)
Pap should include HR HPV testing.  Please add if needed and cc results to me for review as well.  Reviewed personally.  Lum KeasM. Suzanne Khalaya Mcgurn, MD.

## 2014-06-11 ENCOUNTER — Other Ambulatory Visit (INDEPENDENT_AMBULATORY_CARE_PROVIDER_SITE_OTHER): Payer: BC Managed Care – PPO

## 2014-06-11 DIAGNOSIS — Z0000 Encounter for general adult medical examination without abnormal findings: Secondary | ICD-10-CM

## 2014-06-11 LAB — COMPREHENSIVE METABOLIC PANEL
ALT: 12 U/L (ref 0–35)
AST: 15 U/L (ref 0–37)
Albumin: 3.9 g/dL (ref 3.5–5.2)
Alkaline Phosphatase: 123 U/L — ABNORMAL HIGH (ref 39–117)
BUN: 11 mg/dL (ref 6–23)
CO2: 25 mEq/L (ref 19–32)
Calcium: 8.9 mg/dL (ref 8.4–10.5)
Chloride: 106 meq/L (ref 96–112)
Creat: 0.72 mg/dL (ref 0.50–1.10)
Glucose, Bld: 87 mg/dL (ref 70–99)
Potassium: 3.7 mEq/L (ref 3.5–5.3)
Sodium: 141 mEq/L (ref 135–145)
Total Bilirubin: 0.6 mg/dL (ref 0.2–1.2)
Total Protein: 6.3 g/dL (ref 6.0–8.3)

## 2014-06-11 LAB — LIPID PANEL
Cholesterol: 175 mg/dL (ref 0–200)
HDL: 57 mg/dL (ref >39)
LDL Cholesterol: 106 mg/dL — ABNORMAL HIGH (ref 0–99)
Total CHOL/HDL Ratio: 3.1 Ratio
Triglycerides: 59 mg/dL (ref <150)
VLDL: 12 mg/dL (ref 0–40)

## 2014-06-11 LAB — IPS PAP TEST WITH HPV

## 2014-06-12 LAB — VITAMIN D 25 HYDROXY (VIT D DEFICIENCY, FRACTURES): Vit D, 25-Hydroxy: 34 ng/mL (ref 30–100)

## 2014-06-30 ENCOUNTER — Encounter: Payer: Self-pay | Admitting: Obstetrics & Gynecology

## 2014-07-02 ENCOUNTER — Telehealth: Payer: Self-pay | Admitting: Obstetrics & Gynecology

## 2014-07-02 NOTE — Telephone Encounter (Signed)
Scheduled

## 2014-07-02 NOTE — Telephone Encounter (Signed)
lmtcb to schedule a 6 month pap per MyChart message from Dr. Hyacinth MeekerMiller.

## 2014-07-02 NOTE — Telephone Encounter (Signed)
Left patient a message to call back to schedule the appointment and ask for me.

## 2014-07-21 ENCOUNTER — Telehealth: Payer: Self-pay | Admitting: Emergency Medicine

## 2014-07-21 NOTE — Telephone Encounter (Signed)
Message left to return call to Etnaracy at [phone].   Calling patient to advise that Dr. Hyacinth MeekerMiller did respond to her mychart message. Patient is scheduled for a 6 month pap with Dr. Hyacinth MeekerMiller, patient was contacted by Mervin KungStarla Curl to schedule and did so. Patient scheduled for 6 month pap smear with Dr. Hyacinth MeekerMiller 01/01/15.   Calling to confirm patient recieved message from Dr. Hyacinth MeekerMiller and ensure patients questions are answered.

## 2014-07-21 NOTE — Telephone Encounter (Signed)
Patient returned call. She is given mychart message from Dr. Hyacinth MeekerMiller. She would like to keep appointment for 6 month follow up pap smear as scheduled for 6/16. She is thankful for Dr. Rondel BatonMiller's follow up and will read the mychart message.  Routing to provider for final review. Patient agreeable to disposition. Will close encounter

## 2014-08-14 ENCOUNTER — Encounter: Payer: BC Managed Care – PPO | Admitting: Internal Medicine

## 2014-10-13 ENCOUNTER — Ambulatory Visit (INDEPENDENT_AMBULATORY_CARE_PROVIDER_SITE_OTHER): Payer: BLUE CROSS/BLUE SHIELD | Admitting: Obstetrics & Gynecology

## 2014-10-13 ENCOUNTER — Telehealth: Payer: Self-pay | Admitting: Obstetrics & Gynecology

## 2014-10-13 VITALS — BP 102/62 | HR 60 | Resp 12 | Wt 209.6 lb

## 2014-10-13 DIAGNOSIS — L739 Follicular disorder, unspecified: Secondary | ICD-10-CM

## 2014-10-13 NOTE — Telephone Encounter (Signed)
Spoke with patient. Patient states that she found a "pea sized" lump under her right axilla close to the right breast over the weekend. States that lump is painful to the touch and appears swollen and red. "I can not see it very well but my husband says it looks like it is under the skin." Advised patient will need to be seen for evaluation of area. Patient is agreeable. Appointment scheduled for today at 11:15am with Dr.Miller. Patient is agreeable to date and time.  Routing to provider for final review. Patient agreeable to disposition. Will close encounter

## 2014-10-13 NOTE — Telephone Encounter (Signed)
Pt states she found lump under right armpit over the weekend. States its pea-sized. Pt normally sees Dr Hyacinth MeekerMiller  [phone] before 1pm [phone] after 1pm  Chart to triage

## 2014-10-15 ENCOUNTER — Encounter: Payer: Self-pay | Admitting: Internal Medicine

## 2014-10-22 ENCOUNTER — Encounter: Payer: Self-pay | Admitting: Obstetrics & Gynecology

## 2014-10-22 NOTE — Progress Notes (Signed)
Subjective:     Patient ID: Brianna [name], female   DOB: 02/22/1966, 49 y.o.   MRN: [id number]  HPI 49 yo G3P2 MWF here for breast exam due to pea sized lump she sound under right axilla.  Noted this over the weekend.  There is some pain associated.  She has no recent breast trauma or nipple discharge.  No skin changes.    Last MMG was 02/19/14.  Review of Systems  All other systems reviewed and are negative.      Objective:   Physical Exam  Constitutional: She appears well-developed and well-nourished.  Pulmonary/Chest: Right breast exhibits no inverted nipple, no mass, no nipple discharge, no skin change and no tenderness. Left breast exhibits no inverted nipple, no mass, no nipple discharge, no skin change and no tenderness.         Assessment:     Axillary folliculitis     Plan:     Warm compresses recommended.  Pt advised if not fully resolved within 4 weeks, she needs to call and be seen again.  Voices clear understanding.

## 2014-11-05 ENCOUNTER — Telehealth: Payer: Self-pay | Admitting: Obstetrics & Gynecology

## 2014-11-05 NOTE — Telephone Encounter (Signed)
Patient canceled her 3 week reck appointment 11/09/14. Patient is feeling better and was told to call and cancel if not needed.

## 2014-11-05 NOTE — Telephone Encounter (Signed)
Dr Hyacinth MeekerMiller, this patient was seen on 3/22 for axillary folliculitis. Do I need to call her or ok to let her call back PRN? Please advise.//kn

## 2014-11-05 NOTE — Telephone Encounter (Signed)
She was worried it was a lymph node when she came but it was an inflamed and tender axillary follicle.  I told her if it wasn't better, I would want to see her again and schedule imaging, but if better she could cancel appt.  So, no need to call.  Thanks.

## 2014-11-09 ENCOUNTER — Ambulatory Visit: Payer: BLUE CROSS/BLUE SHIELD | Admitting: Obstetrics & Gynecology

## 2014-12-31 ENCOUNTER — Telehealth: Payer: Self-pay | Admitting: Obstetrics & Gynecology

## 2014-12-31 NOTE — Telephone Encounter (Signed)
Pt called stating she needed to cancel her 01/01/15 appointment with Dr. Hyacinth Meeker. Pt agreeable to call back for rescheduling.

## 2014-12-31 NOTE — Telephone Encounter (Signed)
Call to patient. Rescheduled her request for 6 month pap smear with Dr. Hyacinth Meeker for 01/15/15 at 1115. Patient agreeable. Routing to provider for final review. Patient agreeable to disposition. Will close encounter.

## 2015-01-01 ENCOUNTER — Ambulatory Visit: Payer: BC Managed Care – PPO | Admitting: Obstetrics & Gynecology

## 2015-01-06 NOTE — Telephone Encounter (Signed)
Okay to close enounter?

## 2015-01-07 NOTE — Telephone Encounter (Signed)
Yes//kn

## 2015-01-15 ENCOUNTER — Ambulatory Visit (INDEPENDENT_AMBULATORY_CARE_PROVIDER_SITE_OTHER): Payer: BLUE CROSS/BLUE SHIELD | Admitting: Obstetrics & Gynecology

## 2015-01-15 ENCOUNTER — Encounter: Payer: Self-pay | Admitting: Obstetrics & Gynecology

## 2015-01-15 VITALS — BP 112/60 | HR 60 | Resp 16 | Wt 213.0 lb

## 2015-01-15 DIAGNOSIS — Z0000 Encounter for general adult medical examination without abnormal findings: Secondary | ICD-10-CM | POA: Diagnosis not present

## 2015-01-15 DIAGNOSIS — B977 Papillomavirus as the cause of diseases classified elsewhere: Secondary | ICD-10-CM

## 2015-01-15 NOTE — Progress Notes (Signed)
Subjective:     Patient ID: Brianna [name], female   DOB: 01/30/1966, 49 y.o.   MRN: [id number]  HPI Very nice 49 yo G3P2 MWF here for repeat Pap smear.  H/o neg pap with HR HPV obtained 10/14.  16/18 testing was done with 18 testing positive.  Colposcopy done same month with no abnormal findings on cervix.  ECC obtained showing negative tissue.  Follow up Pap 11/15 was neg with neg HR HPV.  Pt was anxious about waiting an entire year for repeat pap so she is here for this today.  Aware HPV testing is not indicated today.  comforatble with that.  Denies vaginal bleeding.    Review of Systems  All other systems reviewed and are negative.      Objective:   Physical Exam  Constitutional: She is oriented to person, place, and time. She appears well-developed and well-nourished.  Genitourinary: Vagina normal. There is no rash, tenderness or lesion on the right labia. There is no rash, tenderness or lesion on the left labia. Cervix exhibits no motion tenderness, no discharge and no friability.  Pap obtained.  Lymphadenopathy:       Right: No inguinal adenopathy present.       Left: No inguinal adenopathy present.  Neurological: She is alert and oriented to person, place, and time.       Assessment:     H/O HR HPV only.  18 subtype testing postivie 10/14.  Neg pap with neg HR HPV 11/15.     Plan:     Pap only obtained today.  Pt will return for repeat Pap and HR HPV in six months.  Appt scheduled with Ria Comment 11/16.

## 2015-01-18 LAB — IPS PAP TEST WITH REFLEX TO HPV

## 2015-01-21 ENCOUNTER — Other Ambulatory Visit: Payer: Self-pay

## 2015-01-21 DIAGNOSIS — Z1231 Encounter for screening mammogram for malignant neoplasm of breast: Secondary | ICD-10-CM

## 2015-01-27 ENCOUNTER — Telehealth: Payer: Self-pay

## 2015-01-27 NOTE — Telephone Encounter (Signed)
Lmtcb//kn 

## 2015-01-27 NOTE — Telephone Encounter (Signed)
-----   Message from Jerene BearsMary S Miller, MD sent at 01/18/2015  4:19 PM EDT ----- Please inform pt that her pap is normal.  If in recall, ok to remove.  Should have 08 recall for pap and HR HPV at [facility] in six month.

## 2015-02-02 NOTE — Addendum Note (Signed)
Addended by: Jerene BearsMILLER, Analise S on: 02/02/2015 06:06 AM   Modules accepted: Orders

## 2015-02-15 NOTE — Telephone Encounter (Signed)
Patient notified of results. Has 6 month appointment scheduled/08 recall in epic.//kn

## 2015-02-23 ENCOUNTER — Ambulatory Visit
Admission: RE | Admit: 2015-02-23 | Discharge: 2015-02-23 | Disposition: A | Discharge status: Home / Self Care | Payer: BLUE CROSS/BLUE SHIELD | Source: Ambulatory Visit

## 2015-02-23 DIAGNOSIS — Z1231 Encounter for screening mammogram for malignant neoplasm of breast: Secondary | ICD-10-CM

## 2015-04-27 ENCOUNTER — Telehealth (HOSPITAL_COMMUNITY): Payer: Self-pay | Admitting: *Deleted

## 2015-04-27 ENCOUNTER — Other Ambulatory Visit (HOSPITAL_COMMUNITY): Payer: Self-pay | Admitting: Family Medicine

## 2015-04-27 DIAGNOSIS — I341 Nonrheumatic mitral (valve) prolapse: Secondary | ICD-10-CM

## 2015-05-03 ENCOUNTER — Ambulatory Visit (HOSPITAL_COMMUNITY): Payer: BLUE CROSS/BLUE SHIELD | Attending: Internal Medicine

## 2015-05-03 ENCOUNTER — Other Ambulatory Visit: Payer: Self-pay

## 2015-05-03 DIAGNOSIS — I341 Nonrheumatic mitral (valve) prolapse: Secondary | ICD-10-CM

## 2015-05-03 DIAGNOSIS — I340 Nonrheumatic mitral (valve) insufficiency: Secondary | ICD-10-CM | POA: Insufficient documentation

## 2015-05-03 DIAGNOSIS — I313 Pericardial effusion (noninflammatory): Secondary | ICD-10-CM | POA: Diagnosis not present

## 2015-06-11 ENCOUNTER — Ambulatory Visit: Payer: BC Managed Care – PPO | Admitting: Nurse Practitioner

## 2015-07-27 ENCOUNTER — Other Ambulatory Visit (INDEPENDENT_AMBULATORY_CARE_PROVIDER_SITE_OTHER): Payer: BLUE CROSS/BLUE SHIELD

## 2015-07-27 ENCOUNTER — Other Ambulatory Visit: Payer: Self-pay | Admitting: Obstetrics & Gynecology

## 2015-07-27 DIAGNOSIS — Z0000 Encounter for general adult medical examination without abnormal findings: Secondary | ICD-10-CM

## 2015-07-27 LAB — LIPID PANEL
Cholesterol: 173 mg/dL (ref 125–200)
HDL: 69 mg/dL (ref >=46)
LDL Cholesterol: 94 mg/dL (ref <130)
Total CHOL/HDL Ratio: 2.5 Ratio (ref <=5.0)
Triglycerides: 50 mg/dL (ref <150)
VLDL: 10 mg/dL (ref <30)

## 2015-07-27 LAB — COMPREHENSIVE METABOLIC PANEL
ALT: 16 U/L (ref 6–29)
AST: 17 U/L (ref 10–35)
Albumin: 4.1 g/dL (ref 3.6–5.1)
Alkaline Phosphatase: 141 U/L — ABNORMAL HIGH (ref 33–130)
BUN: 15 mg/dL (ref 7–25)
CO2: 27 mmol/L (ref 20–31)
Calcium: 9.3 mg/dL (ref 8.6–10.4)
Chloride: 105 mmol/L (ref 98–110)
Creat: 0.77 mg/dL (ref 0.50–1.05)
Glucose, Bld: 88 mg/dL (ref 65–99)
Potassium: 4.3 mmol/L (ref 3.5–5.3)
Sodium: 138 mmol/L (ref 135–146)
Total Bilirubin: 0.7 mg/dL (ref 0.2–1.2)
Total Protein: 6.5 g/dL (ref 6.1–8.1)

## 2015-07-27 LAB — TSH: TSH: 0.927 u[IU]/mL (ref 0.350–4.500)

## 2015-07-28 LAB — VITAMIN D 25 HYDROXY (VIT D DEFICIENCY, FRACTURES): Vit D, 25-Hydroxy: 40 ng/mL (ref 30–100)

## 2015-07-30 ENCOUNTER — Encounter: Payer: Self-pay | Admitting: Obstetrics & Gynecology

## 2015-07-30 ENCOUNTER — Ambulatory Visit (INDEPENDENT_AMBULATORY_CARE_PROVIDER_SITE_OTHER): Payer: BLUE CROSS/BLUE SHIELD | Admitting: Obstetrics & Gynecology

## 2015-07-30 VITALS — BP 100/60 | HR 58 | Resp 16 | Ht 67.0 in | Wt 212.0 lb

## 2015-07-30 DIAGNOSIS — Z1211 Encounter for screening for malignant neoplasm of colon: Secondary | ICD-10-CM | POA: Diagnosis not present

## 2015-07-30 DIAGNOSIS — Z124 Encounter for screening for malignant neoplasm of cervix: Secondary | ICD-10-CM

## 2015-07-30 DIAGNOSIS — B977 Papillomavirus as the cause of diseases classified elsewhere: Secondary | ICD-10-CM

## 2015-07-30 DIAGNOSIS — Z23 Encounter for immunization: Secondary | ICD-10-CM

## 2015-07-30 DIAGNOSIS — Z01419 Encounter for gynecological examination (general) (routine) without abnormal findings: Secondary | ICD-10-CM

## 2015-07-30 MED ORDER — VITAMIN D (ERGOCALCIFEROL) 1.25 MG (50000 UNIT) PO CAPS: 50000.0000 [IU] | ORAL_CAPSULE | ORAL | Status: DC

## 2015-07-30 NOTE — Progress Notes (Signed)
50 y.o. [id number] MarriedCaucasianF here for annual exam.  Doing well.  Came in on Tuesday for lab work.  Reviewed with pt today.  She needs a tetanus shot today.    Pt is seeing Dr. Estanislado Pandy for fibromyalgia.  Pt is seeing a physical therapist.    Patient's last menstrual period was 12/22/2012.          Sexually active: Yes.    The current method of family planning is post menopausal status.    Exercising: Yes.    Walking Smoker:  no  Health Maintenance: Pap:  01/15/15 Neg History of abnormal Pap:  Yes, 2013 MMG:  02/23/15 BIRADS1:neg Colonoscopy:  Never.  Was referred last year but canceled due to husband needed surgery BMD:   Never TDaP:  today Screening Labs:,07/27/15 Here Hb today: , Urine today:    reports that she has never smoked. She has never used smokeless tobacco. She reports that she drinks alcohol. She reports that she does not use illicit drugs.  Past Medical History  Diagnosis Date  . Obesity   . Factor V Leiden ([place])   . Molluscum contagiosum   . HPV test positive 9/13    neg Pap, +16/18 HPV  . Plantar fasciitis   . Tendonitis of elbow, right   . Fibromyalgia 2013  . Bursitis of hip     bilateral  . Mitral valve prolapse     Past Surgical History  Procedure Laterality Date  . Cesarean section  1997, 2000  . Temporomandibular joint surgery  1988    Current Outpatient Prescriptions  Medication Sig Dispense Refill  . cetirizine (ZYRTEC) 10 MG tablet Take 10 mg by mouth daily.      . cyclobenzaprine (FLEXERIL) 10 MG tablet Take 10 mg by mouth at bedtime as needed.   3  . fluticasone (VERAMYST) 27.5 MCG/SPRAY nasal spray Place 2 sprays into the nose daily.    Marland Kitchen ibuprofen (ADVIL,MOTRIN) 100 MG/5ML suspension Take 200 mg by mouth every 8 (eight) hours as needed.      . methocarbamol (ROBAXIN) 500 MG tablet Take 500 mg by mouth 2 (two) times daily.    . Multiple Vitamins-Minerals (MULTIVITAMIN WITH MINERALS) tablet Take 1 tablet by mouth daily.      . nadolol  (CORGARD) 40 MG tablet Take 40 mg by mouth daily.      Marland Kitchen omeprazole (PRILOSEC) 20 MG capsule Take 20 mg by mouth daily.      . Probiotic Product (PROBIOTIC DAILY PO) Take by mouth daily.    . traMADol (ULTRAM) 50 MG tablet Take 50 mg by mouth every 6 (six) hours as needed.      . Vitamin D, Ergocalciferol, (DRISDOL) 50000 UNITS CAPS capsule Take 1 capsule (50,000 Units total) by mouth every 14 (fourteen) days. 30 capsule 1   No current facility-administered medications for this visit.    Family History  Problem Relation Age of Onset  . Colon cancer Sister 88    pre-colon cancer-had surgery  . Heart disease Sister     pacemaker, POT syndrome  . Diabetes Father   . Hypertension Father   . Hyperlipidemia Father   . COPD Mother   . Rheum arthritis Mother   . Hyperlipidemia Mother     ROS:  Pertinent items are noted in HPI.  Otherwise, a comprehensive ROS was negative.  Exam:   BP 100/60 mmHg  Pulse 58  Resp 16  Ht '5\' 7"'$  (1.702 m)  Wt 212 lb (96.163 kg)  BMI 33.20 kg/m2  LMP 12/22/2012  Weight change: +3#   Height: '5\' 7"'$  (170.2 cm)  Ht Readings from Last 3 Encounters:  07/30/15 '5\' 7"'$  (1.702 m)  06/08/14 5' 7.25" (1.708 m)  08/20/13 '5\' 9"'$  (1.753 m)    General appearance: alert, cooperative and appears stated age Head: Normocephalic, without obvious abnormality, atraumatic Neck: no adenopathy, supple, symmetrical, trachea midline and thyroid normal to inspection and palpation Lungs: clear to auscultation bilaterally Breasts: normal appearance, no masses or tenderness Heart: regular rate and rhythm Abdomen: soft, non-tender; bowel sounds normal; no masses,  no organomegaly Extremities: extremities normal, atraumatic, no cyanosis or edema Skin: Skin color, texture, turgor normal. No rashes or lesions Lymph nodes: Cervical, supraclavicular, and axillary nodes normal. No abnormal inguinal nodes palpated Neurologic: Grossly normal   Pelvic: External genitalia:  no lesions               Urethra:  normal appearing urethra with no masses, tenderness or lesions              Bartholins and Skenes: normal                 Vagina: normal appearing vagina with normal color and discharge, no lesions              Cervix: no lesions              Pap taken: Yes.   Bimanual Exam:  Uterus:  normal size, contour, position, consistency, mobility, non-tender              Adnexa: normal adnexa and no mass, fullness, tenderness               Rectovaginal: Confirms               Anus:  normal sphincter tone, no lesions  Chaperone was present for exam.  A:  Well Woman with normal exam Postmenopausal, no HRT History of abnormal HR HPV with negative Colpo biopsy 05/19/13.  Neg pap with neg HR HPV 2015. H/O Factor V Leiden Fibromyalgia Vit D deficiency Sister, age 71, with pre-cancerous polyps with recent partial colectomy  Mildly elevated alk phos level.  Checking GGT level today as well.  If elevated will proceed with RUQ ultrasound  P: Yearly MMG Pap smear with HR HPV today  Referral for screening colonoscopy Continue Vit d 50K every two weeks.  Rx to pharmacy.   RUQ ultrasound to assess for stones Refill on Vit D for a year  Return annually or prn

## 2015-07-31 LAB — GAMMA GT: GGT: 20 U/L (ref 7–51)

## 2015-08-03 LAB — IPS PAP TEST WITH HPV

## 2015-08-04 ENCOUNTER — Other Ambulatory Visit: Payer: Self-pay | Admitting: Nurse Practitioner

## 2015-08-11 ENCOUNTER — Telehealth: Payer: Self-pay | Admitting: Emergency Medicine

## 2015-08-11 ENCOUNTER — Encounter: Payer: Self-pay | Admitting: Obstetrics & Gynecology

## 2015-08-11 DIAGNOSIS — R748 Abnormal levels of other serum enzymes: Secondary | ICD-10-CM

## 2015-08-11 NOTE — Telephone Encounter (Signed)
Telephone call for triage created to discuss message with patient and disposition as appropriate.   

## 2015-08-11 NOTE — Telephone Encounter (Signed)
Chief Complaint  Patient presents with  . Advice Only   Dr. Hyacinth Meeker,      Just wanted to touch base with you and see if you have the results from the additional blood testing. You had mentioned doing an abdominal ultrasound to rule out possible gall stones but said their was an additional blood test you could do first. Just wondered if you have heard back yet.   Also, I have tentative date of March 3rd for colonscopy with Dr. Loreta Ave if you need that for your records.  Hope you have a great week.      Thank you   Makinzie Considine  [phone]

## 2015-08-11 NOTE — Telephone Encounter (Signed)
Dr. Hyacinth Meeker,  Routing mychart message for your review.

## 2015-08-13 NOTE — Telephone Encounter (Signed)
Have tried to call pt several times and keep getting cut off today.  Pt's GGT was normal but spoke with Dr. Loreta Ave and she still recommends a GI referral.  I have not placed order for this.  Pt also needs a PTH obtained and may need to see an endocrinologist.    Lastly, pap and HR HPV were normal.  Thanks.

## 2015-08-13 NOTE — Telephone Encounter (Signed)
Patient says her phone got disconnected with Dr. Hyacinth Meeker she was returning her call. [phone]

## 2015-08-13 NOTE — Telephone Encounter (Signed)
Call to patient with message from Dr. Hyacinth Meeker.  Lab appointment made for Monday 08/16/15 at 130. Dr. Hyacinth Meeker can you place order?   Patient scheduled to see Dr. Loreta Ave for consult 08/17/15 at 1530 lab results and office visit faxed to Dr. Loreta Ave.

## 2015-08-16 ENCOUNTER — Other Ambulatory Visit: Payer: BLUE CROSS/BLUE SHIELD

## 2015-08-16 ENCOUNTER — Other Ambulatory Visit (INDEPENDENT_AMBULATORY_CARE_PROVIDER_SITE_OTHER): Payer: BLUE CROSS/BLUE SHIELD

## 2015-08-16 DIAGNOSIS — R748 Abnormal levels of other serum enzymes: Secondary | ICD-10-CM

## 2015-08-16 NOTE — Telephone Encounter (Signed)
Order placed.   Encounter closed.  

## 2015-08-17 LAB — PTH, INTACT AND CALCIUM
Calcium: 9.3 mg/dL (ref 8.4–10.5)
PTH: 69 pg/mL — ABNORMAL HIGH (ref 14–64)

## 2015-08-20 ENCOUNTER — Telehealth: Payer: Self-pay | Admitting: Emergency Medicine

## 2015-08-20 DIAGNOSIS — R7989 Other specified abnormal findings of blood chemistry: Secondary | ICD-10-CM

## 2015-08-20 DIAGNOSIS — R748 Abnormal levels of other serum enzymes: Secondary | ICD-10-CM

## 2015-08-20 NOTE — Telephone Encounter (Signed)
Called and spoke with patient.  She is given message from Dr. Hyacinth Meeker.  She verbalizes understanding of results and accepts referral to Dr. Everardo All.  Referral is placed and advised she will be contacted to schedule appointment. Routing to provider for final review. Patient agreeable to disposition. Will close encounter.

## 2015-08-20 NOTE — Telephone Encounter (Signed)
Message left to return call to Natnael Biederman at [phone].   Endocrinology referral pended.   

## 2015-08-20 NOTE — Telephone Encounter (Signed)
Return call to Tracy. °

## 2015-08-20 NOTE — Telephone Encounter (Signed)
-----  Message from Megan Salon, MD sent at 08/19/2015  7:10 AM EST ----- Please let pt know her calcium was normal but PTH was elevated.  This can contribute to the elevated alk phos level.  I would like her to see Dr. Loanne Drilling, endocrinology.  He doesn't come up when I put in the referral order.  Can you call?  Thanks.

## 2015-09-08 ENCOUNTER — Other Ambulatory Visit: Payer: Self-pay | Admitting: Nurse Practitioner

## 2015-09-08 NOTE — Telephone Encounter (Signed)
Medication refill request:Drisdol Last AEX:  07/30/15 MSM Next AEX: 11/06/16 MSM Last MMG (if hormonal medication request): 02/23/15 BIRADS Category 1 Negative  Refill authorized: 07/30/15 #6 caps 4 Refills  Today Refused: Refill too soon Confirmation from Pharmacy received

## 2015-09-16 ENCOUNTER — Encounter: Payer: Self-pay | Admitting: Obstetrics & Gynecology

## 2015-09-20 ENCOUNTER — Ambulatory Visit (INDEPENDENT_AMBULATORY_CARE_PROVIDER_SITE_OTHER): Payer: BLUE CROSS/BLUE SHIELD | Admitting: Endocrinology

## 2015-09-20 ENCOUNTER — Encounter: Payer: Self-pay | Admitting: Endocrinology

## 2015-09-20 VITALS — BP 106/60 | HR 56 | Temp 97.9°F | Ht 67.0 in | Wt 211.0 lb

## 2015-09-20 DIAGNOSIS — E213 Hyperparathyroidism, unspecified: Secondary | ICD-10-CM | POA: Diagnosis not present

## 2015-09-20 DIAGNOSIS — E559 Vitamin D deficiency, unspecified: Secondary | ICD-10-CM

## 2015-09-20 LAB — PHOSPHORUS: Phosphorus: 3.4 mg/dL (ref 2.3–4.6)

## 2015-09-20 LAB — VITAMIN D 25 HYDROXY (VIT D DEFICIENCY, FRACTURES): VITD: 34.71 ng/mL (ref 30.00–100.00)

## 2015-09-20 NOTE — Progress Notes (Signed)
Subjective:    Patient ID: Brianna [name], female    DOB: 1965/12/17, 50 y.o.   MRN: [id number]  HPI Pt was noted to have mild hyperparathyroidism in 2016. she has never had osteoporosis, urolithiasis, thyroid probs, sarcoidosis, cancer, PUD, pancreatitis, depression, or bony fracture.  He does not take A supplement.  Pt has no h/o prolonged immobilization.  Pt denies taking antacids, Li++, or HCTZ.  She has been on ergocalciferol (now biweekly) x approx 2-3 years.  She has myalgias throughout the body, and assoc pain at the back of the neck.  Past Medical History  Diagnosis Date  . Obesity   . Factor V Leiden ([facility])   . Molluscum contagiosum   . HPV test positive 9/13    neg Pap, +16/18 HPV  . Plantar fasciitis   . Tendonitis of elbow, right   . Fibromyalgia 2013    Dr. Corliss [name]  . Bursitis of hip     bilateral  . Mitral valve prolapse     Past Surgical History  Procedure Laterality Date  . Cesarean section  1997, 2000  . Temporomandibular joint surgery  1988    Social History   Social History  . Marital Status: Married    Spouse Name: N/A  . Number of Children: N/A  . Years of Education: N/A   Occupational History  . Not on file.   Social History Main Topics  . Smoking status: Never Smoker   . Smokeless tobacco: Never Used  . Alcohol Use: Yes     Comment: occassional  . Drug Use: No  . Sexual Activity:    Partners: Male    Birth Control/ Protection: Other-see comments, Post-menopausal     Comment: vasectomy   Other Topics Concern  . Not on file   Social History Narrative    Current Outpatient Prescriptions on File Prior to Visit  Medication Sig Dispense Refill  . cetirizine (ZYRTEC) 10 MG tablet Take 10 mg by mouth daily.      . cyclobenzaprine (FLEXERIL) 10 MG tablet Take 10 mg by mouth at bedtime as needed.   3  . fluticasone (VERAMYST) 27.5 MCG/SPRAY nasal spray Place 2 sprays into the nose daily.    Marland Kitchen ibuprofen (ADVIL,MOTRIN) 100 MG/5ML suspension  Take 200 mg by mouth every 8 (eight) hours as needed.      . methocarbamol (ROBAXIN) 500 MG tablet Take 500 mg by mouth 2 (two) times daily.    . Multiple Vitamins-Minerals (MULTIVITAMIN WITH MINERALS) tablet Take 1 tablet by mouth daily.      . nadolol (CORGARD) 40 MG tablet Take 40 mg by mouth daily.      Marland Kitchen omeprazole (PRILOSEC) 20 MG capsule Take 20 mg by mouth daily.      . Probiotic Product (PROBIOTIC DAILY PO) Take by mouth daily.    . traMADol (ULTRAM) 50 MG tablet Take 50 mg by mouth every 6 (six) hours as needed.      . Vitamin D, Ergocalciferol, (DRISDOL) 50000 units CAPS capsule Take 1 capsule (50,000 Units total) by mouth every 14 (fourteen) days. 6 capsule 4   No current facility-administered medications on file prior to visit.    Allergies  Allergen Reactions  . Z-Pak [Azithromycin]     GI upset  . Sulfa Antibiotics Rash    Family History  Problem Relation Age of Onset  . Colon cancer Sister 68    pre-colon cancer-had surgery  . Heart disease Sister     pacemaker, POT  syndrome  . Diabetes Father   . Hypertension Father   . Hyperlipidemia Father   . COPD Mother   . Rheum arthritis Mother   . Hyperlipidemia Mother   . Hyperparathyroidism Neg Hx     BP 106/60 mmHg  Pulse 56  Temp(Src) 97.9 F (36.6 C) (Oral)  Ht  (1.702 m)  Wt 211 lb (95.709 kg)  BMI 33.04 kg/m2  SpO2 97%  LMP 12/22/2012  Review of Systems denies galactorrhea, hematuria, numbness, abdominal pain, muscle weakness, urinary frequency, hypoglycemia, skin rash, visual loss, sob, diarrhea, and depression.  She has chronic headache, rhinorrhea, easy bruising, and weight gain.      Objective:   Physical Exam VS: see vs page GEN: no distress HEAD: head: no deformity eyes: no periorbital swelling, no proptosis external nose and ears are normal mouth: no lesion seen NECK: supple, thyroid is not enlarged CHEST WALL: no kyphosis. LUNGS:  Clear to auscultation CV: reg rate and rhythm, no  murmur ABD: abdomen is soft, nontender.  no hepatosplenomegaly.  not distended.  no hernia MUSCULOSKELETAL: muscle bulk and strength are grossly normal.  no obvious joint swelling.  gait is normal and steady EXTEMITIES: no deformity.  no edema PULSES: no carotid bruit NEURO:  cn 2-12 grossly intact.   readily moves all 4's.  sensation is intact to touch on all 4's.   SKIN:  Normal texture and temperature.  No rash or suspicious lesion is visible.   NODES:  None palpable at the neck.  PSYCH: alert, well-oriented.  Does not appear anxious nor depressed.   Lab Results  Component Value Date   PTH 69* 08/16/2015   CALCIUM 9.3 08/16/2015   Lab Results  Component Value Date   TSH 0.927 07/27/2015   25-OH vit-D=35    Assessment & Plan:  Vit-D deficiency, well-replaced Hyperparathyroidism, mild, probably predates vit-D replacement.  She probably has developed some autonomous parathyroid function over the years.  The only rx of this [name] is to continue ergocalciferol replacement. Myalgias, not PTH-related    Patient is advised the following: Patient Instructions  blood tests are requested for you today.  We'll let you know about the results. The high parathyroid level is probably due to the low vitamin-D you had in the past.  Although the vitamin-D is better now, the parathyroid can stay high once it has been stimulated (best example of this is in dialysis patients, where the parathyroid can seldom be brought back down to normal).  I would be happy to see you back here as necessary.     Addendum: Please continue the same medication.

## 2015-09-20 NOTE — Patient Instructions (Addendum)
blood tests are requested for you today.  We'll let you know about the results. The high parathyroid level is probably due to the low vitamin-D you had in the past.  Although the vitamin-D is better now, the parathyroid can stay high once it has been stimulated (best example of this is in dialysis patients, where the parathyroid can seldom be brought back down to normal).  I would be happy to see you back here as necessary.

## 2015-09-21 LAB — PTH, INTACT AND CALCIUM
Calcium: 8.9 mg/dL (ref 8.4–10.5)
PTH: 71 pg/mL — ABNORMAL HIGH (ref 14–64)

## 2016-02-16 ENCOUNTER — Other Ambulatory Visit: Payer: Self-pay | Admitting: Family Medicine

## 2016-02-16 DIAGNOSIS — Z1231 Encounter for screening mammogram for malignant neoplasm of breast: Secondary | ICD-10-CM

## 2016-02-24 ENCOUNTER — Ambulatory Visit: Payer: BLUE CROSS/BLUE SHIELD

## 2016-03-14 ENCOUNTER — Ambulatory Visit: Payer: BLUE CROSS/BLUE SHIELD

## 2016-03-16 ENCOUNTER — Ambulatory Visit
Admission: RE | Admit: 2016-03-16 | Discharge: 2016-03-16 | Disposition: A | Discharge status: Home / Self Care | Payer: BLUE CROSS/BLUE SHIELD | Source: Ambulatory Visit | Attending: Family Medicine | Admitting: Family Medicine

## 2016-03-16 DIAGNOSIS — Z1231 Encounter for screening mammogram for malignant neoplasm of breast: Secondary | ICD-10-CM

## 2016-08-01 ENCOUNTER — Ambulatory Visit: Payer: BLUE CROSS/BLUE SHIELD | Admitting: Rheumatology

## 2016-08-17 ENCOUNTER — Encounter: Payer: Self-pay | Admitting: Rheumatology

## 2016-08-17 ENCOUNTER — Ambulatory Visit (INDEPENDENT_AMBULATORY_CARE_PROVIDER_SITE_OTHER): Payer: BLUE CROSS/BLUE SHIELD | Admitting: Rheumatology

## 2016-08-17 VITALS — BP 110/60 | HR 62 | Resp 16 | Ht 69.0 in | Wt 212.0 lb

## 2016-08-17 DIAGNOSIS — M19071 Primary osteoarthritis, right ankle and foot: Secondary | ICD-10-CM | POA: Insufficient documentation

## 2016-08-17 DIAGNOSIS — M7062 Trochanteric bursitis, left hip: Secondary | ICD-10-CM

## 2016-08-17 DIAGNOSIS — Z8669 Personal history of other diseases of the nervous system and sense organs: Secondary | ICD-10-CM

## 2016-08-17 DIAGNOSIS — M791 Myalgia: Secondary | ICD-10-CM

## 2016-08-17 DIAGNOSIS — M19041 Primary osteoarthritis, right hand: Secondary | ICD-10-CM

## 2016-08-17 DIAGNOSIS — M19072 Primary osteoarthritis, left ankle and foot: Secondary | ICD-10-CM

## 2016-08-17 DIAGNOSIS — M7061 Trochanteric bursitis, right hip: Secondary | ICD-10-CM | POA: Insufficient documentation

## 2016-08-17 DIAGNOSIS — M19042 Primary osteoarthritis, left hand: Secondary | ICD-10-CM | POA: Insufficient documentation

## 2016-08-17 DIAGNOSIS — M722 Plantar fascial fibromatosis: Secondary | ICD-10-CM

## 2016-08-17 DIAGNOSIS — Z1589 Genetic susceptibility to other disease: Secondary | ICD-10-CM | POA: Insufficient documentation

## 2016-08-17 DIAGNOSIS — M7918 Myalgia, other site: Secondary | ICD-10-CM

## 2016-08-17 MED ORDER — TRAMADOL HCL 50 MG PO TABS: 50.0000 mg | ORAL_TABLET | Freq: Three times a day (TID) | ORAL | 0 refills | Status: DC | PRN

## 2016-08-17 NOTE — Progress Notes (Signed)
Office Visit Note  Patient: Brianna [name]             Date of Birth: 19-Apr-1966           MRN: [id number]             PCP: Marjorie Smolder, MD Referring: Darcus Austin, MD Visit Date: 08/17/2016 Occupation: '@GUAROCC'$ @    Subjective:  Neck pain   History of Present Illness: Brianna [name] is a 51 y.o. female with history of myofascial pain syndrome. According to her she's been having some discomfort in the neck and shoulder region. Increased right trapezius pain for the last few days. She has history of pedal edema. She states her left foot lateral aspect has been painful lately. Her plantar fasciitis flares off and on. Trochanteric bursitis is better. She describes her pain level anywhere from 5-9. She takes tramadol on when necessary basis. Tramadol lowers her pain level to about 5. She describes fatigue on a scale of 0-10 about 8-9.  Activities of Daily Living:  Patient reports morning stiffness for 2 hours.   Patient Reports nocturnal pain.  Difficulty dressing/grooming: Denies Difficulty climbing stairs: Denies Difficulty getting out of chair: Denies Difficulty using hands for taps, buttons, cutlery, and/or writing: Denies   Review of Systems  Constitutional: Positive for fatigue. Negative for night sweats, weight gain, weight loss and weakness.  HENT: Negative for mouth sores, trouble swallowing, trouble swallowing, mouth dryness and nose dryness.   Eyes: Negative for pain, redness, visual disturbance and dryness.  Respiratory: Negative for cough, shortness of breath and difficulty breathing.   Cardiovascular: Positive for palpitations. Negative for chest pain, hypertension, irregular heartbeat and swelling in legs/feet.       History of mitral valve prolapse  Gastrointestinal: Negative for blood in stool, constipation and diarrhea.  Endocrine: Negative for increased urination.  Genitourinary: Negative for vaginal dryness.  Musculoskeletal: Positive for arthralgias, joint  pain, myalgias, morning stiffness and myalgias. Negative for joint swelling, muscle weakness and muscle tenderness.  Skin: Negative for color change, rash, hair loss, skin tightness, ulcers and sensitivity to sunlight.  Allergic/Immunologic: Negative for susceptible to infections.  Neurological: Negative for dizziness, memory loss and night sweats.  Hematological: Negative for swollen glands.  Psychiatric/Behavioral: Negative for depressed mood and sleep disturbance. The patient is not nervous/anxious.     PMFS History:  Patient Active Problem List   Diagnosis Date Noted  . Myofascial pain syndrome 08/17/2016  . HLA B27 (HLA B27 positive) 08/17/2016  . Trochanteric bursitis of both hips 08/17/2016  . History of migraine 08/17/2016  . Primary osteoarthritis of both hands 08/17/2016  . Primary osteoarthritis of both feet 08/17/2016  . Plantar fasciitis 08/17/2016  . Hyperparathyroidism ([place]) 09/20/2015  . 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 Bursitis of hip    bilateral  . Factor V Leiden ([place])   . Fibromyalgia 2013   Dr. Estanislado Pandy  . 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 Mitral valve prolapse   . Molluscum contagiosum   . Obesity   . Plantar fasciitis   . Tendonitis of elbow, right     Family History  Problem Relation Age of Onset  . Colon cancer Sister 52    pre-colon cancer-had surgery  . Heart disease Sister     pacemaker, POT syndrome  . Diabetes Father   . Hypertension Father   . Hyperlipidemia Father   . COPD  Mother   . Rheum arthritis Mother   . Hyperlipidemia Mother   . Hyperparathyroidism Neg Hx    Past Surgical History:  Procedure Laterality Date  . CESAREAN SECTION  1997, 2000  . [place]   Social History   Social History Narrative  . No narrative on file     Objective: Vital Signs: BP 110/60   Pulse 62   Resp 16   Ht '5\' 9"'$  (1.753 m)   Wt 212 lb (96.2 kg)    LMP 12/22/2012   BMI 31.31 kg/m    Physical Exam  Constitutional: She is oriented to person, place, and time. She appears well-developed and well-nourished.  HENT:  Head: Normocephalic and atraumatic.  Eyes: Conjunctivae and EOM are normal.  Neck: Normal range of motion.  Cardiovascular: Normal rate, regular rhythm, normal heart sounds and intact distal pulses.   Pulmonary/Chest: Effort normal and breath sounds normal.  Abdominal: Soft. Bowel sounds are normal.  Lymphadenopathy:    She has no cervical adenopathy.  Neurological: She is alert and oriented to person, place, and time.  Skin: Skin is warm and dry. Capillary refill takes less than 2 seconds.  Psychiatric: She has a normal mood and affect. Her behavior is normal.  Nursing note and vitals reviewed.    Musculoskeletal Exam: C-spine, thoracic, lumbar spine good range of motion. She has some trapezius is spasm. Shoulder joints, elbow joints, wrist joints, MCPs PIPs DIPs with good range of motion. She thickening of PIP/DIP joints of her hands consistent with osteoarthritis. No synovitis was noted. Hip joints, knee joints, ankles MTPs PIPs with good range of motion. She had a callus palpable on the bottom of her left foot.  CDAI Exam: No CDAI exam completed.    Investigation: Findings:  UDS 02/07/16 narcotic agreement 07/12/15 updated today     Imaging: No results found.  Speciality Comments: No specialty comments available.    Procedures:  No procedures performed Allergies: Z-pak [azithromycin] and Sulfa antibiotics   Assessment / Plan:     Visit Diagnoses: Myofascial pain syndrome: She continues to have some generalized pain and discomfort and positive tender points.  HLA B27 (HLA B27 positive): She has no evidence of inflammatory arthritis  Primary osteoarthritis of both hands: Joint protection and muscle strengthening discussed  Primary osteoarthritis of both feet: She has some callus formation in the bottom  of her left foot which is causing discomfort. I've advised her to make an appointment with the podiatrist. She also had some dorsal spurring. Proper fitting shoes were discussed.  Plantar fasciitis :she's having intermittent discomfort  Trochanteric bursitis of both hips Olen her symptoms have improved.  History of migraine    Orders: No orders of the defined types were placed in this encounter.  No orders of the defined types were placed in this encounter.   Face-to-face time spent with patient was 30 minutes. 50% of time was spent in counseling and coordination of care.  Follow-Up Instructions: Return in about 6 months (around 02/14/2017) for Osteoarthritis,FMS.   Bo Merino, MD  Note - This record has been created using Editor, commissioning.  Chart creation errors have been sought, but may not always  have been located. Such creation errors do not reflect on  the standard of medical care.

## 2016-08-17 NOTE — Patient Instructions (Signed)
Supplements for OA Natural anti-inflammatories  You can purchase these at [employer], [employer] or online.  . Turmeric (capsules)  . Ginger (ginger root or capsules)  . Omega 3 (Fish, flax seeds, chia seeds, walnuts, almonds)  . Tart cherry (dried or extract)   Patient should be under the care of a physician while taking these supplements. This may not be reproduced without the permission of Dr. Akeen Ledyard.  

## 2016-09-09 ENCOUNTER — Other Ambulatory Visit: Payer: Self-pay | Admitting: Obstetrics & Gynecology

## 2016-09-11 NOTE — Telephone Encounter (Signed)
Medication refill request: Vitamin D  Last AEX:  07-30-15  Next AEX: 11-16-16  Last MMG (if hormonal medication request): 03-16-16 WNL  Refill authorized: please advise

## 2016-11-15 NOTE — Progress Notes (Signed)
51 y.o. [id number] MarriedCaucasianF here for annual exam.  Doing well.  No vaginal bleeding.  Recently saw Dr. Estanislado Pandy.  Using muscle relaxer and Ultram when the pain is really severe.  Has been diagnosis with myofascial pain syndrome.  Did see Dr. Loanne Drilling.  He recommended continued Vit D treatment and advised PTH could remain mildly elevated going forward.    Will do blood owrk today.  PCP:  Dr. Inda Merlin  Patient's last menstrual period was 12/22/2012.          Sexually active: Yes.    The current method of family planning is post menopausal status.    Exercising: Yes.    Walking  Smoker:  no  Health Maintenance: Pap:  07/30/15 Neg. HR HPV:neg   01/15/15 Neg  History of abnormal Pap:  Yes, 10/2012 LGSIL. 04/2013 HR HPV:+detected. genotype 18 +.  MMG:  03/16/16 BIRADS1:neg  Colonoscopy:  01/2016 Dr. Collene Mares. f/u 4 years, negative per pt BMD:   Never TDaP:  07/30/15  Pneumonia vaccine(s):  No Zostavax:   No Hep C testing: unsure Screening Labs: Here, Hb today: 13.8, Urine today: Not collected    reports that she has never smoked. She has never used smokeless tobacco. She reports that she drinks alcohol. She reports that she does not use drugs.  Past Medical History:  Diagnosis Date  . Bursitis of hip    bilateral  . Factor V Leiden ([place])   . Fibromyalgia 2013   Dr. Estanislado Pandy  . 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 Mitral valve prolapse   . Molluscum contagiosum   . Obesity   . Plantar fasciitis   . Tendonitis of elbow, right     Past Surgical History:  Procedure Laterality Date  . CESAREAN SECTION  1997, 2000  . TEMPOROMANDIBULAR JOINT SURGERY  1988    Current Outpatient Prescriptions  Medication Sig Dispense Refill  . cetirizine (ZYRTEC) 10 MG tablet Take 10 mg by mouth daily.      . cyanocobalamin 500 MCG tablet Take 500 mcg by mouth daily.    Marland Kitchen ibuprofen (ADVIL,MOTRIN) 100 MG/5ML suspension Take 200 mg by mouth every 8 (eight) hours as needed.      .  methocarbamol (ROBAXIN) 500 MG tablet Take 500 mg by mouth 2 (two) times daily.    . Multiple Vitamins-Minerals (MULTIVITAMIN WITH MINERALS) tablet Take 1 tablet by mouth daily.      . nadolol (CORGARD) 40 MG tablet Take 40 mg by mouth daily.      Marland Kitchen omeprazole (PRILOSEC) 20 MG capsule Take 20 mg by mouth daily.      . Probiotic Product (PROBIOTIC DAILY PO) Take by mouth daily.    . traMADol (ULTRAM) 50 MG tablet Take 1 tablet (50 mg total) by mouth 3 (three) times daily as needed. 90 tablet 0  . TURMERIC PO Take 450 mg by mouth daily.    . Vitamin D, Ergocalciferol, (DRISDOL) 50000 units CAPS capsule TAKE 1 CAPSULE (50,000 UNITS TOTAL) BY MOUTH EVERY 14 (FOURTEEN) DAYS. 6 capsule 1   No current facility-administered medications for this visit.     Family History  Problem Relation Age of Onset  . Colon cancer Sister 86    pre-colon cancer-had surgery  . Heart disease Sister     pacemaker, POT syndrome  . Diabetes Father   . Hypertension Father   . Hyperlipidemia Father   . COPD Mother   . Rheum arthritis Mother   . Hyperlipidemia  Mother   . Hyperparathyroidism Neg Hx     ROS:  Pertinent items are noted in HPI.  Otherwise, a comprehensive ROS was negative.  Exam:   BP 90/60 (BP Location: Left Arm, Patient Position: Sitting, Cuff Size: Large)   Pulse 60   Resp 16   Ht 5' 7.5" (1.715 m)   Wt 214 lb (97.1 kg)   LMP 12/22/2012   BMI 33.02 kg/m    Height: 5' 7.5" (171.5 cm)  Ht Readings from Last 3 Encounters:  11/16/16 5' 7.5" (1.715 m)  08/17/16 _0  (1.753 m)  09/20/15 _1  (1.702 m)    General appearance: alert, cooperative and appears stated age Head: Normocephalic, without obvious abnormality, atraumatic Neck: no adenopathy, supple, symmetrical, trachea midline and thyroid normal to inspection and palpation Lungs: clear to auscultation bilaterally Breasts: normal appearance, no masses or tenderness Heart: regular rate and rhythm Abdomen: soft, non-tender; bowel  sounds normal; no masses,  no organomegaly Extremities: extremities normal, atraumatic, no cyanosis or edema Skin: Skin color, texture, turgor normal. No rashes or lesions Lymph nodes: Cervical, supraclavicular, and axillary nodes normal. No abnormal inguinal nodes palpated Neurologic: Grossly normal   Pelvic: External genitalia:  no lesions              Urethra:  normal appearing urethra with no masses, tenderness or lesions              Bartholins and Skenes: normal                 Vagina: normal appearing vagina with normal color and discharge, no lesions              Cervix: no lesions              Pap taken: Yes.   Bimanual Exam:  Uterus:  normal size, contour, position, consistency, mobility, non-tender              Adnexa: normal adnexa and no mass, fullness, tenderness               Rectovaginal: Confirms               Anus:  normal sphincter tone, no lesions  Chaperone was present for exam.  A:  Well Woman with normal exam PMP, no HRT H/O +HR HPV in the past.  Neg pap and neg HR HPV 11/15 and 1/17. Myofascial pain syndrome Vit D def with elevated PTH Sister with hx of precancerous polyp and hx of partial colectomy H/O mildly elevated alk phos in the past  P:   Mammogram guidelines reviewed pap smear only obtained today CMP, CBC, Lipids, TSH, Vit D obtained today Repeat PTH today Hep c antibody also obtained today Rx for Vit 50K every 14 days.  #6/4RF return annually or prn

## 2016-11-16 ENCOUNTER — Encounter: Payer: Self-pay | Admitting: Obstetrics & Gynecology

## 2016-11-16 ENCOUNTER — Other Ambulatory Visit (HOSPITAL_COMMUNITY)
Admission: RE | Admit: 2016-11-16 | Discharge: 2016-11-16 | Disposition: A | Discharge status: Home / Self Care | Payer: BLUE CROSS/BLUE SHIELD | Source: Ambulatory Visit | Attending: Obstetrics & Gynecology | Admitting: Obstetrics & Gynecology

## 2016-11-16 ENCOUNTER — Ambulatory Visit (INDEPENDENT_AMBULATORY_CARE_PROVIDER_SITE_OTHER): Payer: BLUE CROSS/BLUE SHIELD | Admitting: Obstetrics & Gynecology

## 2016-11-16 VITALS — BP 90/60 | HR 60 | Resp 16 | Ht 67.5 in | Wt 214.0 lb

## 2016-11-16 DIAGNOSIS — Z205 Contact with and (suspected) exposure to viral hepatitis: Secondary | ICD-10-CM

## 2016-11-16 DIAGNOSIS — E215 Disorder of parathyroid gland, unspecified: Secondary | ICD-10-CM | POA: Diagnosis not present

## 2016-11-16 DIAGNOSIS — Z0000 Encounter for general adult medical examination without abnormal findings: Secondary | ICD-10-CM | POA: Diagnosis not present

## 2016-11-16 DIAGNOSIS — Z01419 Encounter for gynecological examination (general) (routine) without abnormal findings: Secondary | ICD-10-CM | POA: Diagnosis not present

## 2016-11-16 DIAGNOSIS — R7989 Other specified abnormal findings of blood chemistry: Secondary | ICD-10-CM

## 2016-11-16 DIAGNOSIS — Z124 Encounter for screening for malignant neoplasm of cervix: Secondary | ICD-10-CM | POA: Diagnosis not present

## 2016-11-16 LAB — CBC
HCT: 43.1 % (ref 35.0–45.0)
Hemoglobin: 14.1 g/dL (ref 11.7–15.5)
MCH: 29.9 pg (ref 27.0–33.0)
MCHC: 32.7 g/dL (ref 32.0–36.0)
MCV: 91.3 fL (ref 80.0–100.0)
MPV: 9.9 fL (ref 7.5–12.5)
Platelets: 266 10*3/uL (ref 140–400)
RBC: 4.72 MIL/uL (ref 3.80–5.10)
RDW: 14 % (ref 11.0–15.0)
WBC: 4.4 10*3/uL (ref 3.8–10.8)

## 2016-11-16 LAB — COMPREHENSIVE METABOLIC PANEL
ALT: 20 U/L (ref 6–29)
AST: 21 U/L (ref 10–35)
Albumin: 4.4 g/dL (ref 3.6–5.1)
Alkaline Phosphatase: 143 U/L — ABNORMAL HIGH (ref 33–130)
BUN: 19 mg/dL (ref 7–25)
CO2: 19 mmol/L — ABNORMAL LOW (ref 20–31)
Calcium: 9.4 mg/dL (ref 8.6–10.4)
Chloride: 105 mmol/L (ref 98–110)
Creat: 0.78 mg/dL (ref 0.50–1.05)
Glucose, Bld: 89 mg/dL (ref 65–99)
Potassium: 4.1 mmol/L (ref 3.5–5.3)
Sodium: 142 mmol/L (ref 135–146)
Total Bilirubin: 0.7 mg/dL (ref 0.2–1.2)
Total Protein: 6.9 g/dL (ref 6.1–8.1)

## 2016-11-16 LAB — TSH: TSH: 0.72 mIU/L

## 2016-11-16 LAB — LIPID PANEL
Cholesterol: 217 mg/dL — ABNORMAL HIGH (ref <200)
HDL: 74 mg/dL (ref >50)
LDL Cholesterol: 132 mg/dL — ABNORMAL HIGH (ref <100)
Total CHOL/HDL Ratio: 2.9 Ratio (ref <5.0)
Triglycerides: 53 mg/dL (ref <150)
VLDL: 11 mg/dL (ref <30)

## 2016-11-16 LAB — HEPATITIS C ANTIBODY: HCV Ab: NEGATIVE

## 2016-11-16 MED ORDER — VITAMIN D (ERGOCALCIFEROL) 1.25 MG (50000 UNIT) PO CAPS: 50000.0000 [IU] | ORAL_CAPSULE | ORAL | 4 refills | Status: DC

## 2016-11-17 LAB — PARATHYROID HORMONE, INTACT (NO CA): PTH: 37 pg/mL (ref 14–64)

## 2016-11-17 LAB — CYTOLOGY - PAP
Adequacy: ABSENT
Diagnosis: NEGATIVE

## 2016-11-17 LAB — VITAMIN D 25 HYDROXY (VIT D DEFICIENCY, FRACTURES): Vit D, 25-Hydroxy: 52 ng/mL (ref 30–100)

## 2016-11-20 LAB — HEMOGLOBIN, FINGERSTICK: Hemoglobin, fingerstick: 13.8 g/dL (ref 12.0–15.0)

## 2016-12-27 ENCOUNTER — Other Ambulatory Visit: Payer: Self-pay | Admitting: Rheumatology

## 2016-12-28 NOTE — Telephone Encounter (Signed)
Last Visit: 08/17/16 Next visit: 02/15/17 UDS: 02/07/16 Narc Agreement: 08/17/16   Okay to refill Tramadol?

## 2016-12-28 NOTE — Telephone Encounter (Signed)
ok 

## 2017-02-08 ENCOUNTER — Other Ambulatory Visit: Payer: Self-pay | Admitting: Rheumatology

## 2017-02-08 ENCOUNTER — Other Ambulatory Visit: Payer: Self-pay | Admitting: Family Medicine

## 2017-02-08 DIAGNOSIS — Z1231 Encounter for screening mammogram for malignant neoplasm of breast: Secondary | ICD-10-CM

## 2017-02-08 NOTE — Telephone Encounter (Signed)
Last Visit: 08/17/16 Next visit: 02/15/17  Okay to refill per Dr. Corliss Skainseveshwar

## 2017-02-09 NOTE — Progress Notes (Deleted)
Office Visit Note  Patient: Brianna [name]             Date of Birth: Aug 27, 1965           MRN: [id number]             PCP: Darcus Austin, MD Referring: Darcus Austin, MD Visit Date: 02/15/2017 Occupation: _0 @    Subjective:  No chief complaint on file.   History of Present Illness: Brianna [name] is a 51 y.o. female ***   Activities of Daily Living:  Patient reports morning stiffness for *** {minute/hour:19697}.   Patient {ACTIONS;DENIES/REPORTS:21021675::"Denies"} nocturnal pain.  Difficulty dressing/grooming: {ACTIONS;DENIES/REPORTS:21021675::"Denies"} Difficulty climbing stairs: {ACTIONS;DENIES/REPORTS:21021675::"Denies"} Difficulty getting out of chair: {ACTIONS;DENIES/REPORTS:21021675::"Denies"} Difficulty using hands for taps, buttons, cutlery, and/or writing: {ACTIONS;DENIES/REPORTS:21021675::"Denies"}   No Rheumatology ROS completed.   PMFS History:  Patient Active Problem List   Diagnosis Date Noted  . Myofascial pain syndrome 08/17/2016  . HLA B27 (HLA B27 positive) 08/17/2016  . Trochanteric bursitis of both hips 08/17/2016  . History of migraine 08/17/2016  . Primary osteoarthritis of both hands 08/17/2016  . Primary osteoarthritis of both feet 08/17/2016  . Plantar fasciitis 08/17/2016  . Hyperparathyroidism ([place]) 09/20/2015  . 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 Bursitis of hip    bilateral  . Factor V Leiden ([place])   . Fibromyalgia 2013   Dr. Estanislado Pandy  . 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 Mitral valve prolapse   . Molluscum contagiosum   . Obesity   . Plantar fasciitis   . Tendonitis of elbow, right     Family History  Problem Relation Age of Onset  . Colon cancer Sister 27       pre-colon cancer-had surgery  . Heart disease Sister        pacemaker, POT syndrome  . Diabetes Father   . Hypertension Father   . Hyperlipidemia Father   . Parkinson's disease Father   . COPD  Mother   . Rheum arthritis Mother   . Hyperlipidemia Mother   . Heart attack Mother        01/2016  . Hyperparathyroidism Neg Hx    Past Surgical History:  Procedure Laterality Date  . CESAREAN SECTION  1997, 2000  . [place]   Social History   Social History Narrative  . No narrative on file     Objective: Vital Signs: LMP 12/22/2012    Physical Exam   Musculoskeletal Exam: ***  CDAI Exam: No CDAI exam completed.    Investigation: No additional findings.  CBC Latest Ref Rng & Units 11/16/2016 11/16/2016 05/02/2013  WBC 3.8 - 10.8 K/uL 4.4 - -  Hemoglobin 11.7 - 15.5 g/dL 14.1 13.8 13.6  Hematocrit 35.0 - 45.0 % 43.1 - -  Platelets 140 - 400 K/uL 266 - -   CMP Latest Ref Rng & Units 11/16/2016 09/20/2015 08/16/2015  Glucose 65 - 99 mg/dL 89 - -  BUN 7 - 25 mg/dL 19 - -  Creatinine 0.50 - 1.05 mg/dL 0.78 - -  Sodium 135 - 146 mmol/L 142 - -  Potassium 3.5 - 5.3 mmol/L 4.1 - -  Chloride 98 - 110 mmol/L 105 - -  CO2 20 - 31 mmol/L 19(L) - -  Calcium 8.6 - 10.4 mg/dL 9.4 8.9 9.3  Total Protein 6.1 - 8.1 g/dL 6.9 - -  Total Bilirubin 0.2 - 1.2 mg/dL 0.7 - -  Alkaline Phos 33 - 130 U/L 143(H) - -  AST 10 - 35 U/L 21 - -  ALT 6 - 29 U/L 20 - -    Imaging: No results found.  Speciality Comments: No specialty comments available.    Procedures:  No procedures performed Allergies: Z-pak [azithromycin] and Sulfa antibiotics   Assessment / Plan:     Visit Diagnoses: Myofascial pain syndrome  Other fatigue  Primary osteoarthritis of both hands  Trochanteric bursitis of both hips  Primary osteoarthritis of both feet  Plantar fasciitis  History of migraine  HLA B27 (HLA B27 positive)  History of hyperparathyroidism    Orders: No orders of the defined types were placed in this encounter.  No orders of the defined types were placed in this encounter.   Face-to-face time spent with patient was *** minutes. 50% of time was  spent in counseling and coordination of care.  Follow-Up Instructions: No Follow-up on file.   Amy Littrell, RT  Note - This record has been created using [employer].  Chart creation errors have been sought, but may not always  have been located. Such creation errors do not reflect on  the standard of medical care.

## 2017-02-15 ENCOUNTER — Ambulatory Visit: Payer: BLUE CROSS/BLUE SHIELD | Admitting: Rheumatology

## 2017-03-19 ENCOUNTER — Ambulatory Visit: Payer: BLUE CROSS/BLUE SHIELD

## 2017-04-02 ENCOUNTER — Other Ambulatory Visit: Payer: Self-pay | Admitting: Rheumatology

## 2017-04-02 NOTE — Telephone Encounter (Signed)
ok 

## 2017-04-02 NOTE — Telephone Encounter (Signed)
Last Visit: 08/17/16 Next Visit was due in July, patient no showed the visit. Message sent to front to schedule patient.  UDS: 02/07/16 Narc Agreement: 08/17/16   Okay to refill Tramadol?

## 2017-05-01 ENCOUNTER — Ambulatory Visit
Admission: RE | Admit: 2017-05-01 | Discharge: 2017-05-01 | Disposition: A | Discharge status: Home / Self Care | Payer: BLUE CROSS/BLUE SHIELD | Source: Ambulatory Visit | Attending: Family Medicine | Admitting: Family Medicine

## 2017-05-01 DIAGNOSIS — Z1231 Encounter for screening mammogram for malignant neoplasm of breast: Secondary | ICD-10-CM

## 2017-05-19 NOTE — Progress Notes (Signed)
Office Visit Note  Patient: Brianna [name]             Date of Birth: December 25, 1965           MRN: [id number]             PCP: Darcus Austin, MD Referring: Darcus Austin, MD Visit Date: 05/22/2017 Occupation: '@GUAROCC'$ @    Subjective:  Fibromyalgia (Tired, aching)   History of Present Illness: Brianna [name] is a 51 y.o. female  Patient describes her fibromyalgia as 9-10 on a scale of 0-10. She is usually tired but patient reports that over the last 1 week she is Very tired especially last week w/ assoc bilateral elbow pain and right knee.  Fibromyalgia is rated about 9-10 on a scale of 0-10 today. Overall she has been having more fibromyalgia discomfort the last week than in the past weeks.  Patient is also describing right knee osteoarthritis she is complaining of stiffness and trouble getting up from a seated position. When she gets down on the floor, she is having a hard time raising back up like she used to.  In the past she is was able to get up without much difficulty but not any longer.  Activities of Daily Living:  Patient reports morning stiffness for 2 hours.   Patient Reports nocturnal pain.  Difficulty dressing/grooming: Reports Difficulty climbing stairs: Reports Difficulty getting out of chair: Reports Difficulty using hands for taps, buttons, cutlery, and/or writing: Denies   Review of Systems  Constitutional: Positive for fatigue, weight gain and weakness.  HENT: Negative for mouth dryness.   Eyes: Negative for dryness.  Respiratory: Negative for shortness of breath.   Cardiovascular: Positive for swelling in legs/feet.  Gastrointestinal: Positive for heartburn.  Endocrine: Positive for heat intolerance.  Genitourinary: Negative for painful urination.  Musculoskeletal: Positive for arthralgias, joint pain, joint swelling, muscle weakness, morning stiffness and muscle tenderness.  Skin: Negative for hair loss.  Neurological: Positive for numbness and  headaches.  Hematological: Positive for bruising/bleeding tendency.  Psychiatric/Behavioral: Positive for sleep disturbance.    PMFS History:  Patient Active Problem List   Diagnosis Date Noted  . Myofascial pain syndrome 08/17/2016  . HLA B27 (HLA B27 positive) 08/17/2016  . Trochanteric bursitis of both hips 08/17/2016  . History of migraine 08/17/2016  . Primary osteoarthritis of both hands 08/17/2016  . Primary osteoarthritis of both feet 08/17/2016  . Plantar fasciitis 08/17/2016  . Hyperparathyroidism ([place]) 09/20/2015  . 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 Bursitis of hip    bilateral  . Factor V Leiden ([place])   . Fibromyalgia 2013   Dr. Estanislado Pandy  . 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 Mitral valve prolapse   . Molluscum contagiosum   . Obesity   . Plantar fasciitis   . Tendonitis of elbow, right     Family History  Problem Relation Age of Onset  . Colon cancer Sister 7       pre-colon cancer-had surgery  . Heart disease Sister   . Diabetes Father   . Hypertension Father   . Hyperlipidemia Father   . Parkinson's disease Father   . COPD Mother   . Rheum arthritis Mother   . Hyperlipidemia Mother   . Heart attack Mother        01/2016  . Breast cancer Maternal Aunt   . Hyperparathyroidism Neg Hx    Past  Surgical History:  Procedure Laterality Date  . CESAREAN SECTION  1997, 2000  . [place]   Social History   Social History Narrative  . No narrative on file     Objective: Vital Signs: BP 106/62 (BP Location: Left Arm, Patient Position: Sitting, Cuff Size: Normal)   Pulse 71   Resp 14   Ht '5\' 9"'$  (1.753 m)   Wt 219 lb (99.3 kg)   LMP 12/22/2012   BMI 32.34 kg/m    Physical Exam  Constitutional: She is oriented to person, place, and time. She appears well-developed and well-nourished.  HENT:  Head: Normocephalic and atraumatic.  Eyes: Pupils are equal, round,  and reactive to light. EOM are normal.  Cardiovascular: Normal rate, regular rhythm and normal heart sounds.  Exam reveals no gallop and no friction rub.   No murmur heard. Pulmonary/Chest: Effort normal and breath sounds normal. She has no wheezes. She has no rales.  Abdominal: Soft. Bowel sounds are normal. She exhibits no distension. There is no tenderness. There is no guarding. No hernia.  Musculoskeletal: Normal range of motion. She exhibits no edema, tenderness or deformity.  Lymphadenopathy:    She has no cervical adenopathy.  Neurological: She is alert and oriented to person, place, and time. Coordination normal.  Skin: Skin is warm and dry. Capillary refill takes less than 2 seconds. No rash noted.  Psychiatric: She has a normal mood and affect. Her behavior is normal.  Nursing note and vitals reviewed.    Musculoskeletal Exam:  Full range of motion of all joints Grip strength is equal and strong bilaterally Fibromyalgia tender points are 6 out of 18 positive.  CDAI Exam: No CDAI exam completed.  No synovitis on examination  Investigation: No additional findings. CBC Latest Ref Rng & Units 11/16/2016 11/16/2016 05/02/2013  WBC 3.8 - 10.8 K/uL 4.4 - -  Hemoglobin 11.7 - 15.5 g/dL 14.1 13.8 13.6  Hematocrit 35.0 - 45.0 % 43.1 - -  Platelets 140 - 400 K/uL 266 - -   CMP Latest Ref Rng & Units 11/16/2016 09/20/2015 08/16/2015  Glucose 65 - 99 mg/dL 89 - -  BUN 7 - 25 mg/dL 19 - -  Creatinine 0.50 - 1.05 mg/dL 0.78 - -  Sodium 135 - 146 mmol/L 142 - -  Potassium 3.5 - 5.3 mmol/L 4.1 - -  Chloride 98 - 110 mmol/L 105 - -  CO2 20 - 31 mmol/L 19(L) - -  Calcium 8.6 - 10.4 mg/dL 9.4 8.9 9.3  Total Protein 6.1 - 8.1 g/dL 6.9 - -  Total Bilirubin 0.2 - 1.2 mg/dL 0.7 - -  Alkaline Phos 33 - 130 U/L 143(H) - -  AST 10 - 35 U/L 21 - -  ALT 6 - 29 U/L 20 - -    Imaging: Mm Screening Breast Tomo Bilateral  Result Date: 05/01/2017 CLINICAL DATA:  Screening. EXAM: 2D DIGITAL  SCREENING BILATERAL MAMMOGRAM WITH CAD AND ADJUNCT TOMO COMPARISON:  Previous exam(s). ACR Breast Density Category b: There are scattered areas of fibroglandular density. FINDINGS: There are no findings suspicious for malignancy. Images were processed with CAD. IMPRESSION: No mammographic evidence of malignancy. A result letter of this screening mammogram will be mailed directly to the patient. RECOMMENDATION: Screening mammogram in one year. (Code:SM-B-01Y) BI-RADS CATEGORY  1: Negative. Electronically Signed   By: Claudie Revering M.D.   On: 05/01/2017 16:15    Speciality Comments: No specialty comments available.    Procedures:  No  procedures performed Allergies: Z-pak [azithromycin] and Sulfa antibiotics   Assessment / Plan:     Visit Diagnoses: Myofascial pain syndrome  HLA B27 (HLA B27 positive)  Primary osteoarthritis of both hands  Primary osteoarthritis of both feet  Plantar fasciitis  History of migraine  Trochanteric bursitis of both hips  History of hyperparathyroidism   Plan: 1.:  Fibromyalgia syndrome.  (Myofascial pain syndrome). 6 out of 18 tender points. Active disease with generalized pain. Generally has pain rated 9-10 on a scale of 0-10  2.:  Fatigue. Generally has fatigue most of the time but last week, patient had severe fatigue with associated elbow pain and right knee pain.  3.:  Insomnia.  No change since the last visit  4.:  Hand pain. I have advised the patient to do hand exercises. She can also use Voltaren as discussed in the office.  5.:  Plantar fasciitis. I have advised the patient to get shoe inserts. She can also use a frozen soft drink bottle and roll it on her feet as discussed in the office for managing the plantar fasciitis.  6.:  Return to clinic in 5-6 months  7.:  Patient does not recall any med refills that she needs at this time.  She will call us if she does discover that she needs something  8.:  Labs today: CBC with CMP with  GFR.   Orders: No orders of the defined types were placed in this encounter.  No orders of the defined types were placed in this encounter.   Face-to-face time spent with patient was 30 minutes. 50% of time was spent in counseling and coordination of care.  Follow-Up Instructions: No Follow-up on file.   Eliezer Lofts, PA-C  Note - This record has been created using [employer].  Chart creation errors have been sought, but may not always  have been located. Such creation errors do not reflect on  the standard of medical care.

## 2017-05-22 ENCOUNTER — Encounter: Payer: Self-pay | Admitting: Rheumatology

## 2017-05-22 ENCOUNTER — Ambulatory Visit (INDEPENDENT_AMBULATORY_CARE_PROVIDER_SITE_OTHER): Payer: BLUE CROSS/BLUE SHIELD | Admitting: Rheumatology

## 2017-05-22 VITALS — BP 106/62 | HR 71 | Resp 14 | Ht 69.0 in | Wt 219.0 lb

## 2017-05-22 DIAGNOSIS — Z79899 Other long term (current) drug therapy: Secondary | ICD-10-CM | POA: Diagnosis not present

## 2017-05-22 DIAGNOSIS — M19071 Primary osteoarthritis, right ankle and foot: Secondary | ICD-10-CM | POA: Diagnosis not present

## 2017-05-22 DIAGNOSIS — M7062 Trochanteric bursitis, left hip: Secondary | ICD-10-CM

## 2017-05-22 DIAGNOSIS — M7061 Trochanteric bursitis, right hip: Secondary | ICD-10-CM | POA: Diagnosis not present

## 2017-05-22 DIAGNOSIS — M19072 Primary osteoarthritis, left ankle and foot: Secondary | ICD-10-CM | POA: Diagnosis not present

## 2017-05-22 DIAGNOSIS — M19042 Primary osteoarthritis, left hand: Secondary | ICD-10-CM

## 2017-05-22 DIAGNOSIS — M19041 Primary osteoarthritis, right hand: Secondary | ICD-10-CM | POA: Diagnosis not present

## 2017-05-22 DIAGNOSIS — Z1589 Genetic susceptibility to other disease: Secondary | ICD-10-CM | POA: Diagnosis not present

## 2017-05-22 DIAGNOSIS — Z8669 Personal history of other diseases of the nervous system and sense organs: Secondary | ICD-10-CM

## 2017-05-22 DIAGNOSIS — M7918 Myalgia, other site: Secondary | ICD-10-CM

## 2017-05-22 DIAGNOSIS — Z8639 Personal history of other endocrine, nutritional and metabolic disease: Secondary | ICD-10-CM | POA: Diagnosis not present

## 2017-05-22 DIAGNOSIS — M722 Plantar fascial fibromatosis: Secondary | ICD-10-CM

## 2017-05-22 LAB — COMPLETE METABOLIC PANEL WITH GFR
AG Ratio: 1.9 (calc) (ref 1.0–2.5)
ALT: 13 U/L (ref 6–29)
AST: 16 U/L (ref 10–35)
Albumin: 4.4 g/dL (ref 3.6–5.1)
Alkaline phosphatase (APISO): 156 U/L — ABNORMAL HIGH (ref 33–130)
BUN: 17 mg/dL (ref 7–25)
CO2: 28 mmol/L (ref 20–32)
Calcium: 9.2 mg/dL (ref 8.6–10.4)
Chloride: 105 mmol/L (ref 98–110)
Creat: 0.95 mg/dL (ref 0.50–1.05)
GFR, Est African American: 80 mL/min/{1.73_m2} (ref >=60)
GFR, Est Non African American: 69 mL/min/{1.73_m2} (ref >=60)
Globulin: 2.3 g/dL (ref 1.9–3.7)
Glucose, Bld: 112 mg/dL — ABNORMAL HIGH (ref 65–99)
Potassium: 4.1 mmol/L (ref 3.5–5.3)
Sodium: 141 mmol/L (ref 135–146)
Total Bilirubin: 0.7 mg/dL (ref 0.2–1.2)
Total Protein: 6.7 g/dL (ref 6.1–8.1)

## 2017-05-22 LAB — CBC WITH DIFFERENTIAL/PLATELET
Basophils Absolute: 53 cells/uL (ref 0–200)
Basophils Relative: 0.8 %
Eosinophils Absolute: 112 cells/uL (ref 15–500)
Eosinophils Relative: 1.7 %
HCT: 40.9 % (ref 35.0–45.0)
Hemoglobin: 14.1 g/dL (ref 11.7–15.5)
Lymphs Abs: 1280 cells/uL (ref 850–3900)
MCH: 30.4 pg (ref 27.0–33.0)
MCHC: 34.5 g/dL (ref 32.0–36.0)
MCV: 88.1 fL (ref 80.0–100.0)
MPV: 10.8 fL (ref 7.5–12.5)
Monocytes Relative: 5.6 %
Neutro Abs: 4785 cells/uL (ref 1500–7800)
Neutrophils Relative %: 72.5 %
Platelets: 266 10*3/uL (ref 140–400)
RBC: 4.64 10*6/uL (ref 3.80–5.10)
RDW: 12.6 % (ref 11.0–15.0)
Total Lymphocyte: 19.4 %
WBC mixed population: 370 {cells}/uL (ref 200–950)
WBC: 6.6 10*3/uL (ref 3.8–10.8)

## 2017-05-23 NOTE — Progress Notes (Signed)
Labs are stable.

## 2017-06-30 ENCOUNTER — Other Ambulatory Visit: Payer: Self-pay | Admitting: Rheumatology

## 2017-07-04 NOTE — Telephone Encounter (Signed)
ok 

## 2017-07-04 NOTE — Telephone Encounter (Signed)
Last Visit: 05/22/17 Next Visit: 09/10/17 UDS: 02/07/16 Narc Agreement: 08/17/16   Okay to refill Tramadol?

## 2017-08-15 ENCOUNTER — Ambulatory Visit (INDEPENDENT_AMBULATORY_CARE_PROVIDER_SITE_OTHER): Payer: BLUE CROSS/BLUE SHIELD

## 2017-08-15 ENCOUNTER — Ambulatory Visit (INDEPENDENT_AMBULATORY_CARE_PROVIDER_SITE_OTHER): Payer: BLUE CROSS/BLUE SHIELD | Admitting: Orthopedic Surgery

## 2017-08-15 ENCOUNTER — Encounter (INDEPENDENT_AMBULATORY_CARE_PROVIDER_SITE_OTHER): Payer: Self-pay | Admitting: Orthopedic Surgery

## 2017-08-15 DIAGNOSIS — M7501 Adhesive capsulitis of right shoulder: Secondary | ICD-10-CM

## 2017-08-15 DIAGNOSIS — M25511 Pain in right shoulder: Secondary | ICD-10-CM | POA: Diagnosis not present

## 2017-08-15 NOTE — Progress Notes (Signed)
Office Visit Note   Patient: Brianna [name]           Date of Birth: January 18, 1966           MRN: [id number] Visit Date: 08/15/2017 Requested by: Darcus Austin, MD [address], [place] [address] PCP: Darcus Austin, MD  Subjective: Chief Complaint  Patient presents with  . Right Shoulder - Pain    HPI: Brianna [name] is a patient with 1 month history of right shoulder pain.  She fell about a month ago but landed on her knees.  Did not really injure the shoulder at that time.  She has developed progressive pain and loss of motion in that right shoulder.  Pain is constant but it comes and goes in its severity.  Denies any numbness and tingling or neck pain.  She has been taking Advil and Ultram.  She may have fibromyalgia.  She states the pain radiates underneath her axilla and it is hard for her to hook her bra.              ROS: All systems reviewed are negative as they relate to the chief complaint within the history of present illness.  Patient denies  fevers or chills.   Assessment & Plan: Visit Diagnoses:  1. Acute pain of right shoulder     Plan: Impression is early right frozen shoulder.  Radiographs look good.  I want her to do some stretching and continue to take anti-inflammatories.  2-week return to consider injection at that time if her symptoms and range of motion are not improving.  Follow-Up Instructions: No Follow-up on file.   Orders:  Orders Placed This Encounter  Procedures  . XR Shoulder Right   No orders of the defined types were placed in this encounter.     Procedures: No procedures performed   Clinical Data: No additional findings.  Objective: Vital Signs: LMP 12/22/2012   Physical Exam:   Constitutional: Patient appears well-developed HEENT:  Head: Normocephalic Eyes:EOM are normal Neck: Normal range of motion Cardiovascular: Normal rate Pulmonary/chest: Effort normal Neurologic: Patient is alert Skin: Skin is  warm Psychiatric: Patient has normal mood and affect    Ortho Exam: Orthopedic exam demonstrates good rotator cuff strength in the right shoulder.  She has palpable radial pulses.  Good cervical spine range of motion.  Has about 10 degrees less external rotation and forward flexion on the right compared to the left.  Is very subtle but it is present.  There is no coarse grinding or crepitus in the right shoulder with passive range of motion.  No AC joint tenderness to direct palpation.  No other masses lymphadenopathy or skin changes noted in the right shoulder girdle region.  Specialty Comments:  No specialty comments available.  Imaging: Xr Shoulder Right  Result Date: 08/15/2017 AP axillary lateral and outlet view right shoulder reviewed.  Glenohumeral joint is reduced.  No fracture or dislocation.  No arthritis in the [facility] joint or glenohumeral joint.  Visualized lung fields normal.  Normal right shoulder    PMFS History: Patient Active Problem List   Diagnosis Date Noted  . Myofascial pain syndrome 08/17/2016  . HLA B27 (HLA B27 positive) 08/17/2016  . Trochanteric bursitis of both hips 08/17/2016  . History of migraine 08/17/2016  . Primary osteoarthritis of both hands 08/17/2016  . Primary osteoarthritis of both feet 08/17/2016  . Plantar fasciitis 08/17/2016  . Hyperparathyroidism ([place]) 09/20/2015  . High risk HPV infection  05/19/2013   Past Medical History:  Diagnosis Date  . Bursitis of hip    bilateral  . Factor V Leiden ([place])   . Fibromyalgia 2013   Dr. Estanislado Pandy  . 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 Mitral valve prolapse   . Molluscum contagiosum   . Obesity   . Plantar fasciitis   . Tendonitis of elbow, right     Family History  Problem Relation Age of Onset  . Colon cancer Sister 82       pre-colon cancer-had surgery  . Heart disease Sister   . Diabetes Father   . Hypertension Father   . Hyperlipidemia Father   . Parkinson's disease  Father   . COPD Mother   . Rheum arthritis Mother   . Hyperlipidemia Mother   . Heart attack Mother        01/2016  . Breast cancer Maternal Aunt   . Hyperparathyroidism Neg Hx     Past Surgical History:  Procedure Laterality Date  . CESAREAN SECTION  1997, 2000  . [place]   Social History   Occupational History  . Not on file  Tobacco Use  . Smoking status: Never Smoker  . Smokeless tobacco: Never Used  Substance and Sexual Activity  . Alcohol use: Yes    Comment: occassional  . Drug use: No  . Sexual activity: Yes    Partners: Male    Birth control/protection: Other-see comments, Post-menopausal    Comment: vasectomy

## 2017-08-28 NOTE — Progress Notes (Deleted)
Office Visit Note  Patient: Brianna [name]             Date of Birth: June 21, 1966           MRN: [id number]             PCP: Darcus Austin, MD Referring: Darcus Austin, MD Visit Date: 09/10/2017 Occupation: '@GUAROCC'$ @    Subjective:  No chief complaint on file.   History of Present Illness: Brianna [name] is a 52 y.o. female ***   Activities of Daily Living:  Patient reports morning stiffness for *** {minute/hour:19697}.   Patient {ACTIONS;DENIES/REPORTS:21021675::"Denies"} nocturnal pain.  Difficulty dressing/grooming: {ACTIONS;DENIES/REPORTS:21021675::"Denies"} Difficulty climbing stairs: {ACTIONS;DENIES/REPORTS:21021675::"Denies"} Difficulty getting out of chair: {ACTIONS;DENIES/REPORTS:21021675::"Denies"} Difficulty using hands for taps, buttons, cutlery, and/or writing: {ACTIONS;DENIES/REPORTS:21021675::"Denies"}   No Rheumatology ROS completed.   PMFS History:  Patient Active Problem List   Diagnosis Date Noted  . Myofascial pain syndrome 08/17/2016  . HLA B27 (HLA B27 positive) 08/17/2016  . Trochanteric bursitis of both hips 08/17/2016  . History of migraine 08/17/2016  . Primary osteoarthritis of both hands 08/17/2016  . Primary osteoarthritis of both feet 08/17/2016  . Plantar fasciitis 08/17/2016  . Hyperparathyroidism ([place]) 09/20/2015  . 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 Bursitis of hip    bilateral  . Factor V Leiden ([place])   . Fibromyalgia 2013   Dr. Estanislado Pandy  . 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 Mitral valve prolapse   . Molluscum contagiosum   . Obesity   . Plantar fasciitis   . Tendonitis of elbow, right     Family History  Problem Relation Age of Onset  . Colon cancer Sister 13       pre-colon cancer-had surgery  . Heart disease Sister   . Diabetes Father   . Hypertension Father   . Hyperlipidemia Father   . Parkinson's disease Father   . COPD Mother   . Rheum arthritis Mother    . Hyperlipidemia Mother   . Heart attack Mother        01/2016  . Breast cancer Maternal Aunt   . Hyperparathyroidism Neg Hx    Past Surgical History:  Procedure Laterality Date  . CESAREAN SECTION  1997, 2000  . [place]   Social History   Social History Narrative  . Not on file     Objective: Vital Signs: LMP 12/22/2012    Physical Exam   Musculoskeletal Exam: ***  CDAI Exam: No CDAI exam completed.    Investigation: No additional findings.   Imaging: Xr Shoulder Right  Result Date: 08/15/2017 AP axillary lateral and outlet view right shoulder reviewed.  Glenohumeral joint is reduced.  No fracture or dislocation.  No arthritis in the [facility] joint or glenohumeral joint.  Visualized lung fields normal.  Normal right shoulder   Speciality Comments: No specialty comments available.    Procedures:  No procedures performed Allergies: Z-pak [azithromycin]; Amoxicillin-pot clavulanate; and Sulfa antibiotics   Assessment / Plan:     Visit Diagnoses: No diagnosis found.    Orders: No orders of the defined types were placed in this encounter.  No orders of the defined types were placed in this encounter.   Face-to-face time spent with patient was *** minutes. 50% of time was spent in counseling and coordination of care.  Follow-Up Instructions: No Follow-up on file.   Earnestine Mealing, CMA  Note -  This record has been created using [employer].  Chart creation errors have been sought, but may not always  have been located. Such creation errors do not reflect on  the standard of medical care.

## 2017-09-10 ENCOUNTER — Ambulatory Visit: Payer: BLUE CROSS/BLUE SHIELD | Admitting: Rheumatology

## 2017-09-10 NOTE — Progress Notes (Addendum)
Office Visit Note  Patient: Brianna [name]             Date of Birth: [birth year]           MRN: [id number]             PCP: Darcus Austin, MD Referring: Darcus Austin, MD Visit Date: 09/18/2017 Occupation: '@GUAROCC'$ @    Subjective:  Right shoulder pain  History of Present Illness: Brianna [name] is a 51 y.o. female with myofascial pain syndrome and osteoarthritis.  Patient states that she continues to have pain in her hands.  She reports intermittent swelling in her hands.  She states that she continues to take Robaxin PRN for muscle spasms and muscle tension.  She has muscle tenderness in the trapezius region.  She also continues to have trochanteric bursitis on the left side.  Her pain is worse when laying on her left side.  She states she has been trying to walk more frequently.  She would like to start exercising more.  She has been experiencing occasional pain in her left foot. She denies any swelling.  She has some swelling in her ankles but she is unsure if it is fluid retention or joint swelling.   She reports she has been having right shoulder pain. She denies any injuries that she is aware of.  She reports the pain is worse with overhead activities.  She saw Dr. Marlou Sa on 08/15/17 who wanted her to do a trial with NSAIDs.  She has early frozen shoulder.  She is returning to see him next week to discuss her treatment options.  She continues to take Tramadol PRN for pain relief.  She does not need any refills at this time.  She states she has been having an increased frequency of migraines.  She reports she is having about 5 migraines a month.  She is going to talk to her PCP about starting on a daily medication.  She does not see a neurologist.       Activities of Daily Living:  Patient reports morning stiffness for 2 hours.   Patient Reports nocturnal pain.  Difficulty dressing/grooming: Denies Difficulty climbing stairs: Denies Difficulty getting out of chair: Denies Difficulty  using hands for taps, buttons, cutlery, and/or writing: Denies   Review of Systems  Constitutional: Positive for fatigue. Negative for weakness.  HENT: Negative for mouth sores, sore throat and mouth dryness.   Eyes: Negative for pain, redness, visual disturbance and dryness.  Respiratory: Negative for cough, hemoptysis, shortness of breath and difficulty breathing.   Cardiovascular: Positive for palpitations. Negative for chest pain, hypertension, irregular heartbeat and swelling in legs/feet.  Gastrointestinal: Negative for blood in stool, constipation and diarrhea.  Endocrine: Negative for increased urination.  Genitourinary: Negative for painful urination.  Musculoskeletal: Positive for arthralgias, joint pain, joint swelling, morning stiffness and muscle tenderness. Negative for myalgias, muscle weakness and myalgias.  Skin: Negative for color change, pallor, rash, hair loss, nodules/bumps, redness, skin tightness, ulcers and sensitivity to sunlight.  Allergic/Immunologic: Negative for susceptible to infections.  Neurological: Positive for headaches (Migraines). Negative for dizziness and numbness.  Hematological: Negative for swollen glands.  Psychiatric/Behavioral: Negative for depressed mood and sleep disturbance. The patient is not nervous/anxious.     PMFS History:  Patient Active Problem List   Diagnosis Date Noted  . Myofascial pain syndrome 08/17/2016  . HLA B27 (HLA B27 positive) 08/17/2016  . Trochanteric bursitis of both hips 08/17/2016  . History of migraine 08/17/2016  .  Primary osteoarthritis of both hands 08/17/2016  . Primary osteoarthritis of both feet 08/17/2016  . Plantar fasciitis 08/17/2016  . Hyperparathyroidism ([place]) 09/20/2015  . 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 Bursitis of hip    bilateral  . Factor V Leiden ([place])   . Fibromyalgia 2013   Dr. Estanislado Pandy  . HPV test positive 9/13   neg Pap, +16/18 HPV    . Mitral valve prolapse   . Molluscum contagiosum   . Obesity   . Plantar fasciitis   . Tendonitis of elbow, right     Family History  Problem Relation Age of Onset  . Colon cancer Sister 32       pre-colon cancer-had surgery  . Heart disease Sister   . Diabetes Father   . Hypertension Father   . Hyperlipidemia Father   . Parkinson's disease Father   . COPD Mother   . Rheum arthritis Mother   . Hyperlipidemia Mother   . Heart attack Mother        01/2016  . Breast cancer Maternal Aunt   . Hyperparathyroidism Neg Hx    Past Surgical History:  Procedure Laterality Date  . CESAREAN SECTION  1997, 2000  . [place]   Social History   Social History Narrative  . Not on file     Objective: Vital Signs: BP 93/61 (BP Location: Left Arm, Patient Position: Sitting, Cuff Size: Large)   Pulse 80   Resp 14   Wt 222 lb (100.7 kg)   LMP 12/22/2012   BMI 32.78 kg/m    Physical Exam  Constitutional: She is oriented to person, place, and time. She appears well-developed and well-nourished.  HENT:  Head: Normocephalic and atraumatic.  Eyes: Conjunctivae and EOM are normal.  Neck: Normal range of motion.  Cardiovascular: Normal rate, regular rhythm, normal heart sounds and intact distal pulses.  Pulmonary/Chest: Effort normal and breath sounds normal.  Abdominal: Soft. Bowel sounds are normal.  Lymphadenopathy:    She has no cervical adenopathy.  Neurological: She is alert and oriented to person, place, and time.  Skin: Skin is warm and dry. Capillary refill takes less than 2 seconds.  Psychiatric: She has a normal mood and affect. Her behavior is normal.  Nursing note and vitals reviewed.    Musculoskeletal Exam: C-spine, thoracic, and lumbar spine good ROM.  Right shoulder slightly limited external rotation and forward flexion.  No crepitus of bilateral shoulders. Left shoulder joint, elbow joints, wrist joints, MCPs, PIPs, DIPs good ROM with no  synovitis. PIP thickening consistent with osteoarthritis.  Hip joints, knee joints, ankle joints, MTPs, PIPs, and DIPs good ROM with no synovitis.  No midline spinal tenderness or SI joint tenderness.Trapezius muscle tenderness.  Left trochanteric bursa tenderness.    CDAI Exam: No CDAI exam completed.    Investigation: No additional findings. CBC Latest Ref Rng & Units 05/22/2017 11/16/2016 11/16/2016  WBC 3.8 - 10.8 Thousand/uL 6.6 4.4 -  Hemoglobin 11.7 - 15.5 g/dL 14.1 14.1 13.8  Hematocrit 35.0 - 45.0 % 40.9 43.1 -  Platelets 140 - 400 Thousand/uL 266 266 -   CMP Latest Ref Rng & Units 05/22/2017 11/16/2016 09/20/2015  Glucose 65 - 99 mg/dL 112(H) 89 -  BUN 7 - 25 mg/dL 17 19 -  Creatinine 0.50 - 1.05 mg/dL 0.95 0.78 -  Sodium 135 - 146 mmol/L 141 142 -  Potassium 3.5 - 5.3 mmol/L 4.1 4.1 -  Chloride 98 - 110 mmol/L 105 105 -  CO2 20 - 32 mmol/L 28 19(L) -  Calcium 8.6 - 10.4 mg/dL 9.2 9.4 8.9  Total Protein 6.1 - 8.1 g/dL 6.7 6.9 -  Total Bilirubin 0.2 - 1.2 mg/dL 0.7 0.7 -  Alkaline Phos 33 - 130 U/L - 143(H) -  AST 10 - 35 U/L 16 21 -  ALT 6 - 29 U/L 13 20 -    Imaging: No results found.  Speciality Comments: No specialty comments available.    Procedures:  No procedures performed Allergies: Z-pak [azithromycin]; Amoxicillin-pot clavulanate; and Sulfa antibiotics   Assessment / Plan:     Visit Diagnoses: Myofascial pain syndrome - She has very little muscle tenderness and muscle tension.  She continues to have trochanteric bursa tenderness on the left side.  She has some tenderness in the trapezius region bilaterally.  She declined trigger point injections at this time.  She takes Robaxin PRN.   She continues to have fatigue.  She was encouraged to exercise on a regular basis.  She does not need any refills of her medications today.   Medication monitoring encounter - Tramadol.  Her UDS and narcotic agreement were updated today. - Plan: Pain Mgmt, Profile 5 w/Conf, U,  Pain Mgmt, Tramadol w/medMATCH, U  Trochanteric bursitis of both hips: She continues to have tenderness of her left trochanteric bursa.  She declined a cortisone injection at this time.    Primary osteoarthritis of both hands: She continues to have bilateral hand pain.  She has no synovitis on exam.  She has no tenderness. She has PIP synovial thickening consistent with osteoarthritis. Joint protection and muscle strengthening were discussed.   Primary osteoarthritis of both feet: She has no synovitis on exam.  Proper fitting shoes were discussed.   Acute pain of right shoulder: Patient saw Dr. Marlou Sa on 08/15/17 for acute pain of right shoulder.  X-rays were unremarkable, and she was diagnosed with early right frozen shoulder.  She was advised to perform stretching exercises and to take anti-inflammatories. She is going to be following up with Dr. Marlou Sa next week to discuss treatment options.  She continues to some limitation with forward flexion and external rotation. No shoulder crepitus was noted.       Plantar fasciitis: Resolved.  Other medical conditions are listed as follows:   HLA B27 (HLA B27 positive)  History of migraine: She was advised to follow up with her PCP to discuss starting on a daily preventative medication. She was reminded that tramadol can worsen her migraines.   Hyperparathyroidism ([place])  Vitamin D deficiency: She takes vitamin D supplement.    Orders: Orders Placed This Encounter  Procedures  . Pain Mgmt, Profile 5 w/Conf, U  . Pain Mgmt, Tramadol w/medMATCH, U   No orders of the defined types were placed in this encounter.     Follow-Up Instructions: Return in about 6 months (around 03/18/2018) for Myofascial pain syndrome, Osteoarthritis.   Ofilia Neas, PA-C  Note - This record has been created using Dragon software.  Chart creation errors have been sought, but may not always  have been located. Such creation errors do not reflect on  the standard  of medical care.

## 2017-09-18 ENCOUNTER — Ambulatory Visit: Payer: 59 | Admitting: Physician Assistant

## 2017-09-18 ENCOUNTER — Encounter: Payer: Self-pay | Admitting: Physician Assistant

## 2017-09-18 VITALS — BP 93/61 | HR 80 | Resp 14 | Wt 222.0 lb

## 2017-09-18 DIAGNOSIS — M722 Plantar fascial fibromatosis: Secondary | ICD-10-CM

## 2017-09-18 DIAGNOSIS — M7062 Trochanteric bursitis, left hip: Secondary | ICD-10-CM | POA: Diagnosis not present

## 2017-09-18 DIAGNOSIS — M19041 Primary osteoarthritis, right hand: Secondary | ICD-10-CM

## 2017-09-18 DIAGNOSIS — Z8669 Personal history of other diseases of the nervous system and sense organs: Secondary | ICD-10-CM | POA: Diagnosis not present

## 2017-09-18 DIAGNOSIS — Z5181 Encounter for therapeutic drug level monitoring: Secondary | ICD-10-CM

## 2017-09-18 DIAGNOSIS — M19071 Primary osteoarthritis, right ankle and foot: Secondary | ICD-10-CM | POA: Diagnosis not present

## 2017-09-18 DIAGNOSIS — M25511 Pain in right shoulder: Secondary | ICD-10-CM

## 2017-09-18 DIAGNOSIS — M19042 Primary osteoarthritis, left hand: Secondary | ICD-10-CM

## 2017-09-18 DIAGNOSIS — M7918 Myalgia, other site: Secondary | ICD-10-CM

## 2017-09-18 DIAGNOSIS — E213 Hyperparathyroidism, unspecified: Secondary | ICD-10-CM

## 2017-09-18 DIAGNOSIS — E559 Vitamin D deficiency, unspecified: Secondary | ICD-10-CM

## 2017-09-18 DIAGNOSIS — M19072 Primary osteoarthritis, left ankle and foot: Secondary | ICD-10-CM

## 2017-09-18 DIAGNOSIS — M7061 Trochanteric bursitis, right hip: Secondary | ICD-10-CM

## 2017-09-18 DIAGNOSIS — Z1589 Genetic susceptibility to other disease: Secondary | ICD-10-CM | POA: Diagnosis not present

## 2017-09-18 NOTE — Addendum Note (Signed)
Addended by: Pollyann SavoyEVESHWAR, Albi Rappaport on: 09/18/2017 05:18 PM   Modules accepted: Level of Service

## 2017-09-20 LAB — PAIN MGMT, PROFILE 5 W/CONF, U
Amphetamines: NEGATIVE ng/mL (ref <500)
Barbiturates: NEGATIVE ng/mL (ref <300)
Benzodiazepines: NEGATIVE ng/mL (ref <100)
Cocaine Metabolite: NEGATIVE ng/mL (ref <150)
Creatinine: 106.4 mg/dL
Marijuana Metabolite: NEGATIVE ng/mL (ref <20)
Methadone Metabolite: NEGATIVE ng/mL (ref <100)
Opiates: NEGATIVE ng/mL (ref <100)
Oxidant: NEGATIVE ug/mL (ref <200)
Oxycodone: NEGATIVE ng/mL (ref <100)
pH: 6.14 (ref 4.5–9.0)

## 2017-09-20 LAB — PAIN MGMT, TRAMADOL W/MEDMATCH, U
Desmethyltramadol: 5334 ng/mL — ABNORMAL HIGH (ref <100)
Tramadol: 11589 ng/mL — ABNORMAL HIGH (ref <100)

## 2017-09-21 NOTE — Progress Notes (Signed)
Consistent with treatment

## 2017-09-26 ENCOUNTER — Encounter (INDEPENDENT_AMBULATORY_CARE_PROVIDER_SITE_OTHER): Payer: Self-pay | Admitting: Orthopedic Surgery

## 2017-09-26 ENCOUNTER — Ambulatory Visit (INDEPENDENT_AMBULATORY_CARE_PROVIDER_SITE_OTHER): Payer: 59 | Admitting: Orthopedic Surgery

## 2017-09-26 DIAGNOSIS — M7501 Adhesive capsulitis of right shoulder: Secondary | ICD-10-CM

## 2017-09-26 NOTE — Progress Notes (Signed)
Office Visit Note   Patient: Brianna [name]           Date of Birth: 1966-05-30           MRN: [id number] Visit Date: 09/26/2017 Requested by: Brianna Austin, MD [address] [place], [place] [address] PCP: Brianna Austin, MD  Subjective: Chief Complaint  Patient presents with  . Right Shoulder - Follow-up    HPI: Brianna [name] is a patient with early frozen shoulder.  Here for follow-up.  She states she is doing about the same.  Not really much better or worse.  Describes some pain with overhead activity.  Takes Ultram as needed.  She cannot take ibuprofen daily.  Turning when she sleeps it feels a little different.              ROS: All systems reviewed are negative as they relate to the chief complaint within the history of present illness.  Patient denies  fevers or chills.   Assessment & Plan: Visit Diagnoses:  1. Adhesive capsulitis of right shoulder     Plan: Impression is clinically improved frozen shoulder with the only limitation in range of motion coming with forward flexion which is about 10 degrees off right compared to left.  Cuff strength is good.  I will see her back as needed.  If she becomes more symptomatic injection into the joint and her subacromial space would be the next step  Follow-Up Instructions: Return if symptoms worsen or fail to improve.   Orders:  No orders of the defined types were placed in this encounter.  No orders of the defined types were placed in this encounter.     Procedures: No procedures performed   Clinical Data: No additional findings.  Objective: Vital Signs: LMP 12/22/2012   Physical Exam:   Constitutional: Patient appears well-developed HEENT:  Head: Normocephalic Eyes:EOM are normal Neck: Normal range of motion Cardiovascular: Normal rate Pulmonary/chest: Effort normal Neurologic: Patient is alert Skin: Skin is warm Psychiatric: Patient has normal mood and affect    Ortho Exam: Orthopedic exam  demonstrates full active and passive range of motion of the left shoulder.  On the right-hand side she has external rotation at 15 degrees of abduction to about 70 degrees bilaterally isolated glenohumeral abduction is over 90 degrees bilaterally forward flexion is about 165 on the right compared to 175 on the left rotator cuff strength is good.  Specialty Comments:  No specialty comments available.  Imaging: No results found.   PMFS History: Patient Active Problem List   Diagnosis Date Noted  . Myofascial pain syndrome 08/17/2016  . HLA B27 (HLA B27 positive) 08/17/2016  . Trochanteric bursitis of both hips 08/17/2016  . History of migraine 08/17/2016  . Primary osteoarthritis of both hands 08/17/2016  . Primary osteoarthritis of both feet 08/17/2016  . Plantar fasciitis 08/17/2016  . Hyperparathyroidism ([place]) 09/20/2015  . High risk HPV infection 05/19/2013   Past Medical History:  Diagnosis Date  . Bursitis of hip    bilateral  . Factor V Leiden ([place])   . Fibromyalgia 2013   Dr. Estanislado [name]  . 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 Mitral valve prolapse   . Molluscum contagiosum   . Obesity   . Plantar fasciitis   . Tendonitis of elbow, right     Family History  Problem Relation Age of Onset  . Colon cancer Sister 86       pre-colon cancer-had  surgery  . Heart disease Sister   . Diabetes Father   . Hypertension Father   . Hyperlipidemia Father   . Parkinson's disease Father   . COPD Mother   . Rheum arthritis Mother   . Hyperlipidemia Mother   . Heart attack Mother        01/2016  . Breast cancer Maternal Aunt   . Hyperparathyroidism Neg Hx     Past Surgical History:  Procedure Laterality Date  . CESAREAN SECTION  1997, 2000  . [place]   Social History   Occupational History  . Not on file  Tobacco Use  . Smoking status: Never Smoker  . Smokeless tobacco: Never Used  Substance and Sexual Activity  . Alcohol use:  Yes    Comment: occassional  . Drug use: No  . Sexual activity: Yes    Partners: Male    Birth control/protection: Other-see comments, Post-menopausal    Comment: vasectomy

## 2017-10-03 ENCOUNTER — Other Ambulatory Visit: Payer: Self-pay | Admitting: Rheumatology

## 2017-10-03 NOTE — Telephone Encounter (Signed)
Ok to refill 

## 2017-10-03 NOTE — Telephone Encounter (Signed)
Last Visit: 09/18/17 Next Visit: 03/20/18 UDS: 09/18/17 Narc Agreement: 09/18/17  Okay to refill tramadol?

## 2017-10-17 ENCOUNTER — Other Ambulatory Visit: Payer: Self-pay | Admitting: Rheumatology

## 2017-10-17 NOTE — Telephone Encounter (Signed)
Last Visit: 09/18/17 Next Visit: 03/20/18  Okay to refill per Dr. Corliss Skainseveshwar

## 2017-12-06 ENCOUNTER — Other Ambulatory Visit: Payer: Self-pay | Admitting: Obstetrics & Gynecology

## 2017-12-06 NOTE — Telephone Encounter (Signed)
Medication refill request: Vitamin D  Last AEX:  11-16-17  Next AEX: 01-31-18 Last MMG (if hormonal medication request): 05-01-18 WNL  Refill authorized: please advise

## 2018-01-31 ENCOUNTER — Other Ambulatory Visit: Payer: Self-pay

## 2018-01-31 ENCOUNTER — Other Ambulatory Visit (HOSPITAL_COMMUNITY)
Admission: RE | Admit: 2018-01-31 | Discharge: 2018-01-31 | Disposition: A | Discharge status: Home / Self Care | Payer: 59 | Source: Ambulatory Visit | Attending: Obstetrics & Gynecology | Admitting: Obstetrics & Gynecology

## 2018-01-31 ENCOUNTER — Encounter

## 2018-01-31 ENCOUNTER — Encounter: Payer: Self-pay | Admitting: Obstetrics & Gynecology

## 2018-01-31 ENCOUNTER — Ambulatory Visit (INDEPENDENT_AMBULATORY_CARE_PROVIDER_SITE_OTHER): Payer: 59 | Admitting: Obstetrics & Gynecology

## 2018-01-31 VITALS — BP 112/70 | HR 64 | Resp 14 | Ht 67.25 in | Wt 220.8 lb

## 2018-01-31 DIAGNOSIS — Z124 Encounter for screening for malignant neoplasm of cervix: Secondary | ICD-10-CM | POA: Insufficient documentation

## 2018-01-31 DIAGNOSIS — Z01419 Encounter for gynecological examination (general) (routine) without abnormal findings: Secondary | ICD-10-CM

## 2018-01-31 DIAGNOSIS — E559 Vitamin D deficiency, unspecified: Secondary | ICD-10-CM

## 2018-01-31 MED ORDER — VITAMIN D (ERGOCALCIFEROL) 1.25 MG (50000 UNIT) PO CAPS: 50000.0000 [IU] | ORAL_CAPSULE | ORAL | 4 refills | Status: DC

## 2018-01-31 NOTE — Progress Notes (Signed)
52 y.o. [id number] MarriedCaucasianF here for annual exam.  Doing well.  Does have myofascial pain.  Heat helps.    Denies vaginal bleeding.    Patient's last menstrual period was 12/22/2012.          Sexually active: Yes.    The current method of family planning is post menopausal status.    Exercising: Yes.    walk Smoker:  no  Health Maintenance: Pap:  11/16/16 Neg  History of abnormal Pap:  Yes, 2014 HR HPV #18/45 +detected  MMG:  05/01/17 BIRADS1:Neg  Colonoscopy:  09/24/15 normal. F/u 7 years  BMD:   Never TDaP:  2017 Pneumonia vaccine(s):  No Shingrix:   No Hep C testing: 11/16/16 Neg  Screening Labs: Here today- fasting    reports that she has never smoked. She has never used smokeless tobacco. She reports that she drinks alcohol. She reports that she does not use drugs.  Past Medical History:  Diagnosis Date  . Bursitis of hip    bilateral  . Factor V Leiden ([place])   . Fibromyalgia 2013   Dr. Estanislado Pandy  . 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 Mitral valve prolapse   . Molluscum contagiosum   . Obesity   . Plantar fasciitis   . Tendonitis of elbow, right     Past Surgical History:  Procedure Laterality Date  . CESAREAN SECTION  1997, 2000  . TEMPOROMANDIBULAR JOINT SURGERY  1988    Current Outpatient Medications  Medication Sig Dispense Refill  . cetirizine (ZYRTEC) 10 MG tablet Take 10 mg by mouth daily.      . cyanocobalamin 500 MCG tablet Take 500 mcg by mouth daily.    . fluticasone (FLONASE) 50 MCG/ACT nasal spray daily.    Marland Kitchen ibuprofen (ADVIL,MOTRIN) 100 MG/5ML suspension Take 200 mg by mouth every 8 (eight) hours as needed.      . Magnesium 500 MG CAPS Take by mouth daily.    . methocarbamol (ROBAXIN) 500 MG tablet TAKE 1 TABLET BY MOUTH AT 7 AM AND 1 TABLET AT 2 PM AS NEEDED 60 tablet 3  . Misc Natural Products (TART CHERRY ADVANCED PO) Take by mouth.    . Multiple Vitamins-Minerals (MULTIVITAMIN WITH MINERALS) tablet Take 1 tablet by mouth  daily.      . nadolol (CORGARD) 40 MG tablet Take 40 mg by mouth daily.      Marland Kitchen omeprazole (PRILOSEC) 20 MG capsule Take 20 mg by mouth daily.      . Probiotic Product (PROBIOTIC DAILY PO) Take by mouth daily.    . traMADol (ULTRAM) 50 MG tablet TAKE ONE TABLET BY MOUTH THREE TIMES A DAY AS NEEDED 90 tablet 0  . TURMERIC PO Take 450 mg by mouth daily.    . Vitamin D, Ergocalciferol, (DRISDOL) 50000 units CAPS capsule TAKE ONE CAPSULE BY MOUTH EVERY 14 DAYS     No current facility-administered medications for this visit.     Family History  Problem Relation Age of Onset  . Colon cancer Sister 15       pre-colon cancer-had surgery  . Heart disease Sister   . Diabetes Father   . Hypertension Father   . Hyperlipidemia Father   . Parkinson's disease Father   . COPD Mother   . Rheum arthritis Mother   . Hyperlipidemia Mother   . Heart attack Mother        01/2016  . Breast cancer Maternal Aunt   .  Hyperparathyroidism Neg Hx     Review of Systems  Constitutional:       Weight gain   Musculoskeletal: Positive for myalgias.  Skin:       Swollen glands   Neurological: Positive for headaches.  Endo/Heme/Allergies: Bruises/bleeds easily.  All other systems reviewed and are negative.   Exam:   BP 112/70 (BP Location: Right Arm, Patient Position: Sitting, Cuff Size: Large)   Pulse 64   Resp 14   Ht 5' 7.25" (1.708 m)   Wt 220 lb 12.8 oz (100.2 kg)   LMP 12/22/2012   BMI 34.33 kg/m    Height: 5' 7.25" (170.8 cm)  Ht Readings from Last 3 Encounters:  01/31/18 5' 7.25" (1.708 m)  05/22/17 '5\' 9"'$  (1.753 m)  11/16/16 5' 7.5" (1.715 m)    General appearance: alert, cooperative and appears stated age Head: Normocephalic, without obvious abnormality, atraumatic Neck: no adenopathy, supple, symmetrical, trachea midline and thyroid normal to inspection and palpation Lungs: clear to auscultation bilaterally Breasts: normal appearance, no masses or tenderness Heart: regular rate and  rhythm Abdomen: soft, non-tender; bowel sounds normal; no masses,  no organomegaly Extremities: extremities normal, atraumatic, no cyanosis or edema Skin: Skin color, texture, turgor normal. No rashes or lesions Lymph nodes: Cervical, supraclavicular, and axillary nodes normal. No abnormal inguinal nodes palpated Neurologic: Grossly normal   Pelvic: External genitalia:  no lesions              Urethra:  normal appearing urethra with no masses, tenderness or lesions              Bartholins and Skenes: normal                 Vagina: normal appearing vagina with normal color and discharge, no lesions              Cervix: no lesions              Pap taken: Yes.   Bimanual Exam:  Uterus:  normal size, contour, position, consistency, mobility, non-tender              Adnexa: normal adnexa and no mass, fullness, tenderness               Rectovaginal: Confirms               Anus:  normal sphincter tone, no lesions  Chaperone was present for exam.  A:  Well Woman with normal exam PMP, no HRT H/O +HR HPV in the past Myofascial pain syndrome H/o Vit D with elevated PTH that has normalized  Sister with hx of precancerous polyp and hx of partial colectomy R/O mildly elevated alk phos in the past  P:   Mammogram guidelines reviewed.  Doing yearly. pap smear and HR HPV obtained today Lab work obtained today:  CMP, CBC, Vit D Vit D 50K every 14 days.  #6/4RF return annually or prn

## 2018-02-01 LAB — LIPID PANEL
Chol/HDL Ratio: 2.9 {ratio} (ref 0.0–4.4)
Cholesterol, Total: 203 mg/dL — ABNORMAL HIGH (ref 100–199)
HDL: 70 mg/dL (ref >39)
LDL Calculated: 123 mg/dL — ABNORMAL HIGH (ref 0–99)
Triglycerides: 49 mg/dL (ref 0–149)
VLDL Cholesterol Cal: 10 mg/dL (ref 5–40)

## 2018-02-01 LAB — COMPREHENSIVE METABOLIC PANEL
ALT: 14 IU/L (ref 0–32)
AST: 16 IU/L (ref 0–40)
Albumin/Globulin Ratio: 1.6 (ref 1.2–2.2)
Albumin: 4.4 g/dL (ref 3.5–5.5)
Alkaline Phosphatase: 157 IU/L — ABNORMAL HIGH (ref 39–117)
BUN/Creatinine Ratio: 16 (ref 9–23)
BUN: 14 mg/dL (ref 6–24)
Bilirubin Total: 0.5 mg/dL (ref 0.0–1.2)
CO2: 24 mmol/L (ref 20–29)
Calcium: 9.4 mg/dL (ref 8.7–10.2)
Chloride: 104 mmol/L (ref 96–106)
Creatinine, Ser: 0.86 mg/dL (ref 0.57–1.00)
GFR calc Af Amer: 89 mL/min/{1.73_m2} (ref >59)
GFR calc non Af Amer: 77 mL/min/{1.73_m2} (ref >59)
Globulin, Total: 2.7 g/dL (ref 1.5–4.5)
Glucose: 90 mg/dL (ref 65–99)
Potassium: 4.2 mmol/L (ref 3.5–5.2)
Sodium: 142 mmol/L (ref 134–144)
Total Protein: 7.1 g/dL (ref 6.0–8.5)

## 2018-02-01 LAB — VITAMIN D 25 HYDROXY (VIT D DEFICIENCY, FRACTURES): Vit D, 25-Hydroxy: 46.5 ng/mL (ref 30.0–100.0)

## 2018-02-01 LAB — CYTOLOGY - PAP
Adequacy: ABSENT
Diagnosis: NEGATIVE
HPV: NOT DETECTED

## 2018-02-15 ENCOUNTER — Telehealth: Payer: Self-pay | Admitting: *Deleted

## 2018-02-15 NOTE — Telephone Encounter (Signed)
-----  Message from Megan Salon, MD sent at 02/15/2018  9:38 AM EDT ----- 02 recall. Please notify pt her Vit D was normal.  CMP showed continued mild elevated of alk phos.  She was Dr. Collene Mares in the past and following the level was advised.  I checked with her about this and it is ok to keep watching.  Cholesterol is mildly elevated with LDL at 123.  Ok to keep watching this.  Pap and HR HPV was negative.

## 2018-02-15 NOTE — Telephone Encounter (Signed)
Patient returned call and notified of results. Verbalized understanding.   Patient agreeable to disposition. Will close encounter.

## 2018-02-15 NOTE — Telephone Encounter (Signed)
Message left to return call to Emily at [phone].    

## 2018-03-06 NOTE — Progress Notes (Signed)
Office Visit Note  Patient: Brianna [name]             Date of Birth: 01/28/1966           MRN: [id number]             PCP: Darcus Austin, MD Referring: Darcus Austin, MD Visit Date: 03/20/2018 Occupation: _0 @  Subjective:  Trapezius muscle spasms   History of Present Illness: Brianna [name] is a 52 y.o. female with history of myofascial pain syndrome and osteoarthritis.  She takes Robaxin 500 mg PRN for muscle spasms.  She takes tramado 50 mg 3 times daily as needed for pain relief.  She reports that she has been having increased myalgias for the past week.  She states that she has been having increased muscle tenderness and muscle spasms in the trapezius muscles bilaterally.  She is been using a heating pad but also massages her neck.  She denies any symptoms of radiculopathy but has been having increased neck stiffness.  She continues to have trochanteric bursitis bilaterally.  She states that she recently went on vacation and was sleeping in a different bed which exacerbated her symptoms.  She states that she occasionally experiences mild plantar fasciitis.  She denies any pain or swelling in her feet at this time.  She reports that she continues to have discomfort in bilateral hands but denies any joint swelling.  She continues to have chronic fatigue and insomnia.  She has difficulty staying asleep.  She denies using a sleep aid.   Activities of Daily Living:  Patient reports morning stiffness for 2 hours.   Patient Reports nocturnal pain.  Difficulty dressing/grooming: Denies Difficulty climbing stairs: Denies Difficulty getting out of chair: Denies Difficulty using hands for taps, buttons, cutlery, and/or writing: Denies  Review of Systems  Constitutional: Positive for fatigue.  HENT: Negative for mouth sores, mouth dryness and nose dryness.   Eyes: Negative for pain, visual disturbance and dryness.  Respiratory: Negative for cough, hemoptysis, shortness of breath and  difficulty breathing.   Cardiovascular: Positive for palpitations (MVP). Negative for chest pain, hypertension and swelling in legs/feet.  Gastrointestinal: Negative for blood in stool, constipation and diarrhea.  Endocrine: Negative for increased urination.  Genitourinary: Negative for painful urination.  Musculoskeletal: Positive for arthralgias, joint pain, myalgias, morning stiffness, muscle tenderness and myalgias. Negative for joint swelling and muscle weakness.  Skin: Negative for color change, pallor, rash, hair loss, nodules/bumps, skin tightness, ulcers and sensitivity to sunlight.  Allergic/Immunologic: Negative for susceptible to infections.  Neurological: Positive for headaches. Negative for dizziness, numbness and weakness.  Hematological: Negative for swollen glands.  Psychiatric/Behavioral: Positive for sleep disturbance. Negative for depressed mood. The patient is not nervous/anxious.     PMFS History:  Patient Active Problem List   Diagnosis Date Noted  . Myofascial pain syndrome 08/17/2016  . HLA B27 (HLA B27 positive) 08/17/2016  . Trochanteric bursitis of both hips 08/17/2016  . History of migraine 08/17/2016  . Primary osteoarthritis of both hands 08/17/2016  . Primary osteoarthritis of both feet 08/17/2016  . Plantar fasciitis 08/17/2016  . Hyperparathyroidism ([place]) 09/20/2015  . 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 Bursitis of hip    bilateral  . Factor V Leiden ([place])   . Fibromyalgia 2013   Dr. Estanislado Pandy  . 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 Mitral valve prolapse   . Molluscum contagiosum   .  Obesity   . Plantar fasciitis   . Tendonitis of elbow, right     Family History  Problem Relation Age of Onset  . Colon cancer Sister 40       pre-colon cancer-had surgery  . Heart disease Sister   . Diabetes Father   . Hypertension Father   . Hyperlipidemia Father   . Parkinson's disease Father   .  COPD Mother   . Rheum arthritis Mother   . Hyperlipidemia Mother   . Heart attack Mother        01/2016  . Breast cancer Maternal Aunt   . Hyperparathyroidism Neg Hx    Past Surgical History:  Procedure Laterality Date  . CESAREAN SECTION  1997, 2000  . [place]   Social History   Social History Narrative  . Not on file    Objective: Vital Signs: BP 109/70 (BP Location: Left Arm, Patient Position: Sitting, Cuff Size: Normal)   Pulse (!) 51   Resp 15   Ht '5\' 9"'$  (1.753 m)   Wt 223 lb 3.2 oz (101.2 kg)   LMP 12/22/2012   BMI 32.96 kg/m    Physical Exam  Constitutional: She is oriented to person, place, and time. She appears well-developed and well-nourished.  HENT:  Head: Normocephalic and atraumatic.  Eyes: Conjunctivae and EOM are normal.  Neck: Normal range of motion.  Cardiovascular: Normal rate, regular rhythm, normal heart sounds and intact distal pulses.  Pulmonary/Chest: Effort normal and breath sounds normal.  Abdominal: Soft. Bowel sounds are normal.  Lymphadenopathy:    She has no cervical adenopathy.  Neurological: She is alert and oriented to person, place, and time.  Skin: Skin is warm and dry. Capillary refill takes less than 2 seconds.  Psychiatric: She has a normal mood and affect. Her behavior is normal.  Nursing note and vitals reviewed.    Musculoskeletal Exam: C-spine slightly limited range of motion.  Tenderness in bilateral trapezius muscles.  Thoracic lumbar spine good range of motion.  No midline spinal tenderness.  No SI joint tenderness.  Shoulder joints, elbow joints, wrist joints, MCPs, PIPs, DIPs good range of motion with no synovitis.  She has complete fist formation bilaterally.  Hip joints, knee joints, ankle joints, MTPs, PIPs, DIPs good range of motion with no synovitis.  No warmth or effusion of bilateral knee joints.  She has bilateral knee crepitus.  Tenderness of bilateral trochanteric bursa.  CDAI  Exam: CDAI Score: Not documented Patient Global Assessment: Not documented; Provider Global Assessment: Not documented Swollen: Not documented; Tender: Not documented Joint Exam   Not documented   There is currently no information documented on the homunculus. Go to the Rheumatology activity and complete the homunculus joint exam.  Investigation: No additional findings.  Imaging: No results found.  Recent Labs: Lab Results  Component Value Date   WBC 6.6 05/22/2017   HGB 14.1 05/22/2017   PLT 266 05/22/2017   NA 142 01/31/2018   K 4.2 01/31/2018   CL 104 01/31/2018   CO2 24 01/31/2018   GLUCOSE 90 01/31/2018   BUN 14 01/31/2018   CREATININE 0.86 01/31/2018   BILITOT 0.5 01/31/2018   ALKPHOS 157 (H) 01/31/2018   AST 16 01/31/2018   ALT 14 01/31/2018   PROT 7.1 01/31/2018   ALBUMIN 4.4 01/31/2018   CALCIUM 9.4 01/31/2018   GFRAA 89 01/31/2018    Speciality Comments: No specialty comments available.  Procedures:  No procedures performed Allergies: Z-pak [azithromycin]; Amoxicillin-pot  clavulanate; and Sulfa antibiotics   Assessment / Plan:     Visit Diagnoses: Myofascial pain syndrome: She continues to have generalized muscle aches and muscle tenderness due to fibromyalgia.  She is been having trapezius muscle spasms more frequently.  She continues to take Robaxin 500 mg twice daily as needed.  She takes tramadol 50 mg three times daily as needed for pain relief.  She reports that her last prescription was refilled on 01/22/2018, dispensed 21 tablets.  She was encouraged to stay active and exercise on a regular basis.    Medication monitoring encounter - Tramadol.  UDS updated on 09/18/2017.  Trochanteric bursitis of both hips: She has tenderness of bilateral trochanteric bursa on exam.  She has been performing stretching exercises on the daily basis.  Primary osteoarthritis of both hands: She has no synovitis on exam.  She is complete fist formation bilaterally.  Joint  protection and muscle strengthening were discussed.  Primary osteoarthritis of both feet:  She has osteoarthritic changes in bilateral feet. She has no feet pain at this time.   Plantar fasciitis: Resolved.  Other medical conditions are listed as follows:  HLA B27 (HLA B27 positive)  History of migraine  Hyperparathyroidism ([place])  Vitamin D deficiency   Orders: No orders of the defined types were placed in this encounter.  No orders of the defined types were placed in this encounter.   Face-to-face time spent with patient was 30 minutes. Greater than 50% of time was spent in counseling and coordination of care.  Follow-Up Instructions: Return in about 6 months (around 09/20/2018) for Myofascial pain syndrome, Osteoarthritis.   Ofilia Neas, PA-C  Note - This record has been created using Dragon software.  Chart creation errors have been sought, but may not always  have been located. Such creation errors do not reflect on  the standard of medical care.

## 2018-03-20 ENCOUNTER — Ambulatory Visit: Payer: 59 | Admitting: Physician Assistant

## 2018-03-20 ENCOUNTER — Telehealth: Payer: Self-pay | Admitting: Rheumatology

## 2018-03-20 ENCOUNTER — Encounter: Payer: Self-pay | Admitting: Physician Assistant

## 2018-03-20 VITALS — BP 109/70 | HR 51 | Resp 15 | Ht 69.0 in | Wt 223.2 lb

## 2018-03-20 DIAGNOSIS — Z5181 Encounter for therapeutic drug level monitoring: Secondary | ICD-10-CM

## 2018-03-20 DIAGNOSIS — Z1589 Genetic susceptibility to other disease: Secondary | ICD-10-CM

## 2018-03-20 DIAGNOSIS — M7061 Trochanteric bursitis, right hip: Secondary | ICD-10-CM | POA: Diagnosis not present

## 2018-03-20 DIAGNOSIS — Z8669 Personal history of other diseases of the nervous system and sense organs: Secondary | ICD-10-CM

## 2018-03-20 DIAGNOSIS — E213 Hyperparathyroidism, unspecified: Secondary | ICD-10-CM

## 2018-03-20 DIAGNOSIS — M19071 Primary osteoarthritis, right ankle and foot: Secondary | ICD-10-CM

## 2018-03-20 DIAGNOSIS — E559 Vitamin D deficiency, unspecified: Secondary | ICD-10-CM

## 2018-03-20 DIAGNOSIS — M19072 Primary osteoarthritis, left ankle and foot: Secondary | ICD-10-CM

## 2018-03-20 DIAGNOSIS — M19041 Primary osteoarthritis, right hand: Secondary | ICD-10-CM

## 2018-03-20 DIAGNOSIS — M7918 Myalgia, other site: Secondary | ICD-10-CM | POA: Diagnosis not present

## 2018-03-20 DIAGNOSIS — M7062 Trochanteric bursitis, left hip: Secondary | ICD-10-CM

## 2018-03-20 DIAGNOSIS — M19042 Primary osteoarthritis, left hand: Secondary | ICD-10-CM

## 2018-03-20 DIAGNOSIS — M722 Plantar fascial fibromatosis: Secondary | ICD-10-CM

## 2018-03-20 MED ORDER — TRAMADOL HCL 50 MG PO TABS: 50.0000 mg | ORAL_TABLET | Freq: Three times a day (TID) | ORAL | 0 refills | Status: DC | PRN

## 2018-03-20 NOTE — Telephone Encounter (Signed)
Patient called stating she called [facility] to get the remainder of her prescription of Tramadol and was told they needed a new prescription.  Please send to [facility] on [address] in [place]

## 2018-03-20 NOTE — Patient Instructions (Signed)
Neck Exercises Neck exercises can be important for many reasons:  They can help you to improve and maintain flexibility in your neck. This can be especially important as you age.  They can help to make your neck stronger. This can make movement easier.  They can reduce or prevent neck pain.  They may help your upper back.  Ask your health care provider which neck exercises would be best for you. Exercises Neck Press Repeat this exercise 10 times. Do it first thing in the morning and right before bed or as told by your health care provider. 1. Lie on your back on a firm bed or on the floor with a pillow under your head. 2. Use your neck muscles to push your head down on the pillow and straighten your spine. 3. Hold the position as well as you can. Keep your head facing up and your chin tucked. 4. Slowly count to 5 while holding this position. 5. Relax for a few seconds. Then repeat.  Isometric Strengthening Do a full set of these exercises 2 times a day or as told by your health care provider. 1. Sit in a supportive chair and place your hand on your forehead. 2. Push forward with your head and neck while pushing back with your hand. Hold for 10 seconds. 3. Relax. Then repeat the exercise 3 times. 4. Next, do thesequence again, this time putting your hand against the back of your head. Use your head and neck to push backward against the hand pressure. 5. Finally, do the same exercise on either side of your head, pushing sideways against the pressure of your hand.  Prone Head Lifts Repeat this exercise 5 times. Do this 2 times a day or as told by your health care provider. 1. Lie face-down, resting on your elbows so that your chest and upper back are raised. 2. Start with your head facing downward, near your chest. Position your chin either on or near your chest. 3. Slowly lift your head upward. Lift until you are looking straight ahead. Then continue lifting your head as far back as  you can stretch. 4. Hold your head up for 5 seconds. Then slowly lower it to your starting position.  Supine Head Lifts Repeat this exercise 8-10 times. Do this 2 times a day or as told by your health care provider. 1. Lie on your back, bending your knees to point to the ceiling and keeping your feet flat on the floor. 2. Lift your head slowly off the floor, raising your chin toward your chest. 3. Hold for 5 seconds. 4. Relax and repeat.  Scapular Retraction Repeat this exercise 5 times. Do this 2 times a day or as told by your health care provider. 1. Stand with your arms at your sides. Look straight ahead. 2. Slowly pull both shoulders backward and downward until you feel a stretch between your shoulder blades in your upper back. 3. Hold for 10-30 seconds. 4. Relax and repeat.  Contact a health care provider if:  Your neck pain or discomfort gets much worse when you do an exercise.  Your neck pain or discomfort does not improve within 2 hours after you exercise. If you have any of these problems, stop exercising right away. Do not do the exercises again unless your health care provider says that you can. Get help right away if:  You develop sudden, severe neck pain. If this happens, stop exercising right away. Do not do the exercises again unless your   health care provider says that you can. Exercises Neck Stretch  Repeat this exercise 3-5 times. 1. Do this exercise while standing or while sitting in a chair. 2. Place your feet flat on the floor, shoulder-width apart. 3. Slowly turn your head to the right. Turn it all the way to the right so you can look over your right shoulder. Do not tilt or tip your head. 4. Hold this position for 10-30 seconds. 5. Slowly turn your head to the left, to look over your left shoulder. 6. Hold this position for 10-30 seconds.  Neck Retraction Repeat this exercise 8-10 times. Do this 3-4 times a day or as told by your health care  provider. 1. Do this exercise while standing or while sitting in a sturdy chair. 2. Look straight ahead. Do not bend your neck. 3. Use your fingers to push your chin backward. Do not bend your neck for this movement. Continue to face straight ahead. If you are doing the exercise properly, you will feel a slight sensation in your throat and a stretch at the back of your neck. 4. Hold the stretch for 1-2 seconds. Relax and repeat.  This information is not intended to replace advice given to you by your health care provider. Make sure you discuss any questions you have with your health care provider. Document Released: 06/21/2015 Document Revised: 12/16/2015 Document Reviewed: 01/18/2015 Elsevier Interactive Patient Education  2018 [employer]  

## 2018-03-20 NOTE — Telephone Encounter (Signed)
Last visit: 03/20/18 Next Visit: 09/20/18 UDS: 09/18/17 Narc Agreement: 09/18/17  Okay to refill Tramadol?

## 2018-03-26 ENCOUNTER — Other Ambulatory Visit: Payer: Self-pay | Admitting: Family Medicine

## 2018-03-26 DIAGNOSIS — Z1231 Encounter for screening mammogram for malignant neoplasm of breast: Secondary | ICD-10-CM

## 2018-05-03 ENCOUNTER — Ambulatory Visit
Admission: RE | Admit: 2018-05-03 | Discharge: 2018-05-03 | Disposition: A | Discharge status: Home / Self Care | Payer: 59 | Source: Ambulatory Visit | Attending: Family Medicine | Admitting: Family Medicine

## 2018-05-03 DIAGNOSIS — Z1231 Encounter for screening mammogram for malignant neoplasm of breast: Secondary | ICD-10-CM

## 2018-08-23 ENCOUNTER — Telehealth: Payer: Self-pay | Admitting: Rheumatology

## 2018-08-23 ENCOUNTER — Other Ambulatory Visit: Payer: Self-pay | Admitting: Physician Assistant

## 2018-08-23 DIAGNOSIS — Z5181 Encounter for therapeutic drug level monitoring: Secondary | ICD-10-CM

## 2018-08-23 NOTE — Telephone Encounter (Signed)
Attempted to contact the patient and left message for patient to call the office. Patient is due to update narcotic agreement and UDS.    Last Visit: 03/20/18 Next Visit: 09/20/18 UDS: 09/18/17 Narc Agreement: 09/18/17

## 2018-08-23 NOTE — Telephone Encounter (Signed)
Patient made aware and will come to update UDS and Narc agreement 08/23/18

## 2018-08-23 NOTE — Telephone Encounter (Signed)
Patient left a voicemail stating she was returning your call.   

## 2018-08-23 NOTE — Telephone Encounter (Signed)
Already spoke with patient. See note on refill request. Patient due a UDS and narc agreement update.

## 2018-08-23 NOTE — Telephone Encounter (Signed)
Pt is not prescribed MTX by us.

## 2018-09-06 NOTE — Progress Notes (Signed)
Office Visit Note  Patient: Brianna [name]             Date of Birth: 1966/04/10           MRN: [id number]             PCP: Darcus Austin, MD (Inactive) Referring: Darcus Austin, MD Visit Date: 09/20/2018 Occupation: '@GUAROCC'$ @  Subjective:  Generalized pain   History of Present Illness: Brianna [name] is a 53 y.o. female myofascial pain syndrome and osteoarthritis.  She takes Robaxin 500 mg twice daily PRN for muscle spasms and tramadol 50 mg 1 tablet 3 times daily PRN for pain relief.  She has not needed to take Robaxin or Tramadol for 3 weeks.  She has generalized muscle aches and muscle tenderness.  She has been having increased pain in bilateral feet.  She reports that she recently went on vacation was walking more than usual and developed increased discomfort.  She tries wearing different shoes to get some relief.  She denies any joint swelling.  She continues have trochanter bursitis bilaterally worse on the left side. She has stiffness in both hands but no joint swelling. She says she continues to have interrupted sleep at night.  She states that she is good days and bad days with a level of fatigue she experiences.  She reports that the bilateral plantar fasciitis have been manageable.  She reports that she has been having more frequent tension and migraine headaches.  She reports that her primary care recommended Prozac but she did not take it.  She experiences auras prior to migraines.  She has been having 2-3 headaches per week.    Activities of Daily Living:  Patient reports morning stiffness for several hours.   Patient Reports nocturnal pain.  Difficulty dressing/grooming: Denies Difficulty climbing stairs: Denies Difficulty getting out of chair: Denies Difficulty using hands for taps, buttons, cutlery, and/or writing: Reports  Review of Systems  Constitutional: Positive for fatigue.  HENT: Negative for mouth sores, mouth dryness and nose dryness.   Eyes: Negative for pain,  visual disturbance and dryness.  Respiratory: Negative for cough, hemoptysis, shortness of breath and difficulty breathing.   Cardiovascular: Negative for chest pain, palpitations, hypertension and swelling in legs/feet.  Gastrointestinal: Negative for blood in stool, constipation and diarrhea.  Endocrine: Negative for increased urination.  Genitourinary: Negative for painful urination.  Musculoskeletal: Positive for arthralgias, joint pain, joint swelling, muscle weakness, morning stiffness and muscle tenderness. Negative for myalgias and myalgias.  Skin: Positive for rash. Negative for color change, pallor, hair loss, nodules/bumps, skin tightness, ulcers and sensitivity to sunlight.  Allergic/Immunologic: Negative for susceptible to infections.  Neurological: Positive for headaches and weakness. Negative for dizziness and numbness.  Hematological: Negative for swollen glands.  Psychiatric/Behavioral: Positive for sleep disturbance. Negative for depressed mood. The patient is not nervous/anxious.     PMFS History:  Patient Active Problem List   Diagnosis Date Noted  . Myofascial pain syndrome 08/17/2016  . HLA B27 (HLA B27 positive) 08/17/2016  . Trochanteric bursitis of both hips 08/17/2016  . History of migraine 08/17/2016  . Primary osteoarthritis of both hands 08/17/2016  . Primary osteoarthritis of both feet 08/17/2016  . Plantar fasciitis 08/17/2016  . Hyperparathyroidism ([place]) 09/20/2015  . 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 Bursitis of hip    bilateral  . Factor V Leiden ([place])   . Fibromyalgia 2013   Dr. Estanislado Pandy  .  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 Mitral valve prolapse   . Molluscum contagiosum   . Obesity   . Plantar fasciitis   . Tendonitis of elbow, right     Family History  Problem Relation Age of Onset  . Colon cancer Sister 33       pre-colon cancer-had surgery  . Heart disease Sister   .  Graves' disease Sister   . Diabetes Father   . Hypertension Father   . Hyperlipidemia Father   . Parkinson's disease Father   . COPD Mother   . Rheum arthritis Mother   . Hyperlipidemia Mother   . Heart attack Mother        01/2016  . Breast cancer Maternal Aunt   . Hyperparathyroidism Neg Hx    Past Surgical History:  Procedure Laterality Date  . CESAREAN SECTION  1997, 2000  . [place]   Social History   Social History Narrative  . Not on file   Immunization History  Administered Date(s) Administered  . Tdap 07/30/2015     Objective: Vital Signs: BP 114/77 (BP Location: Left Arm, Patient Position: Sitting, Cuff Size: Large)   Pulse 71   Resp 15   Ht '5\' 9"'$  (1.753 m)   Wt 227 lb 9.6 oz (103.2 kg)   LMP 12/22/2012   BMI 33.61 kg/m    Physical Exam Vitals signs and nursing note reviewed.  Constitutional:      Appearance: She is well-developed.  HENT:     Head: Normocephalic and atraumatic.  Eyes:     Conjunctiva/sclera: Conjunctivae normal.  Neck:     Musculoskeletal: Normal range of motion.  Cardiovascular:     Rate and Rhythm: Normal rate and regular rhythm.     Heart sounds: Normal heart sounds.  Pulmonary:     Effort: Pulmonary effort is normal.     Breath sounds: Normal breath sounds.  Abdominal:     General: Bowel sounds are normal.     Palpations: Abdomen is soft.  Lymphadenopathy:     Cervical: No cervical adenopathy.  Skin:    General: Skin is warm and dry.     Capillary Refill: Capillary refill takes less than 2 seconds.  Neurological:     Mental Status: She is alert and oriented to person, place, and time.  Psychiatric:        Behavior: Behavior normal.      Musculoskeletal Exam: C-spine limited range of motion with lateral rotation.  Thoracic and lumbar spine good range of motion.  No midline spinal tenderness.  No SI joint tenderness.  Shoulder joints, elbow joints, wrist joints, MCPs, PIPs, DIPs good range of  motion with no synovitis.  She has complete fist formation bilaterally.  Hip joints, knee joints, ankle joints, MTPs, PIPs, DIPs good range of motion no synovitis.  No warmth or effusion of bilateral knee joints.  Bilateral knee crepitus.  No tender swelling of ankle joints.  Tenderness over bilateral trochanteric bursa. Dorsal spurs noted bilaterally.    CDAI Exam: CDAI Score: Not documented Patient Global Assessment: Not documented; Provider Global Assessment: Not documented Swollen: Not documented; Tender: Not documented Joint Exam   Not documented   There is currently no information documented on the homunculus. Go to the Rheumatology activity and complete the homunculus joint exam.  Investigation: No additional findings.  Imaging: No results found.  Recent Labs: Lab Results  Component Value Date   WBC 6.6 05/22/2017  HGB 14.1 05/22/2017   PLT 266 05/22/2017   NA 142 01/31/2018   K 4.2 01/31/2018   CL 104 01/31/2018   CO2 24 01/31/2018   GLUCOSE 90 01/31/2018   BUN 14 01/31/2018   CREATININE 0.86 01/31/2018   BILITOT 0.5 01/31/2018   ALKPHOS 157 (H) 01/31/2018   AST 16 01/31/2018   ALT 14 01/31/2018   PROT 7.1 01/31/2018   ALBUMIN 4.4 01/31/2018   CALCIUM 9.4 01/31/2018   GFRAA 89 01/31/2018    Speciality Comments: No specialty comments available.  Procedures:  No procedures performed Allergies: Z-pak [azithromycin]; Amoxicillin-pot clavulanate; and Sulfa antibiotics   Assessment / Plan:     Visit Diagnoses: Myofascial pain syndrome: She has generalized muscle aches muscle tenderness due to fibromyalgia.  She is having bilateral trochanter bursitis.  She has trapezius muscle tension and tenderness bilaterally.  She takes Robaxin 500 mg twice daily PRN.  She tries to remain very active.  She continues to have chronic fatigue.  She has good days and bad days with the level of intake she experiences.  She continues to have interrupted sleep at night.  The importance  of regular exercise and good sleep hygiene were discussed.  She will follow-up in the office in 6 months.  Medication monitoring encounter: UDS and narcotic remember updated today on 09/20/2018.  She has not taken tramadol in 3 weeks.  She takes tramadol very sparingly.  Trochanteric bursitis of both hips: She has tenderness over bilateral trochanter bursa.  She was encouraged to perform stretching exercises on a regular basis.  Primary osteoarthritis of both hands: She has PIP and DIP synovial thickening consistent with osteoarthritis of bilateral hands.  She has complete fist motion bilaterally.  She has no synovitis on exam.  Primary osteoarthritis of both feet: She has PIP and DIP synovial thickening consistent with osteoarthritis of bilateral feet.  She has dorsal spurs noted bilaterally.  She is been having increased discomfort in bilateral feet.  We discussed the importance of wearing proper fitting shoes.  She was also given a prescription for Voltaren gel which she can apply topically for pain relief.  Plantar fasciitis: Her discomfort has been manageable.  She was proper fitting shoes.  HLA B27 (HLA B27 positive): She has no SI joint pain or midline spinal tenderness.  She has no synovitis on exam.  History of migraine -She experiences migraines with auras.  She has been having an increased frequency of migraines and tension headaches.  She has 2-3 headaches per week.  She has not been evaluated by neurology in the past.  A referral was placed today.  Plan: Ambulatory referral to Neurology  Therapeutic drug monitoring -UDS and narcotic agreement were updated today on 09/20/2018.  She has not taken tramadol in 3 weeks.  Plan: Pain Mgmt, Profile 5 w/Conf, U, Pain Mgmt, Tramadol w/medMATCH, U   Other medical conditions are listed as follows:  Hyperparathyroidism ([place])  Vitamin D deficiency    Orders: Orders Placed This Encounter  Procedures  . Ambulatory referral to Neurology    Meds ordered this encounter  Medications  . diclofenac sodium (VOLTAREN) 1 % GEL    Sig: Apply 2 g to 4 g to affected joints up to 4 times daily PRN.    Dispense:  4 Tube    Refill:  2    Face-to-face time spent with patient was 30 minutes. Greater than 50% of time was spent in counseling and coordination of care.  Follow-Up Instructions: Return in  about 6 months (around 03/21/2019) for Myofascial pain syndrome, Osteoarthritis.   Ofilia Neas, PA-C  Note - This record has been created using Dragon software.  Chart creation errors have been sought, but may not always  have been located. Such creation errors do not reflect on  the standard of medical care.

## 2018-09-20 ENCOUNTER — Encounter: Payer: Self-pay | Admitting: Physician Assistant

## 2018-09-20 ENCOUNTER — Encounter (INDEPENDENT_AMBULATORY_CARE_PROVIDER_SITE_OTHER): Payer: Self-pay

## 2018-09-20 ENCOUNTER — Ambulatory Visit: Payer: 59 | Admitting: Physician Assistant

## 2018-09-20 ENCOUNTER — Encounter: Payer: Self-pay | Admitting: Neurology

## 2018-09-20 VITALS — BP 114/77 | HR 71 | Resp 15 | Ht 69.0 in | Wt 227.6 lb

## 2018-09-20 DIAGNOSIS — Z5181 Encounter for therapeutic drug level monitoring: Secondary | ICD-10-CM | POA: Diagnosis not present

## 2018-09-20 DIAGNOSIS — Z1589 Genetic susceptibility to other disease: Secondary | ICD-10-CM

## 2018-09-20 DIAGNOSIS — M19072 Primary osteoarthritis, left ankle and foot: Secondary | ICD-10-CM

## 2018-09-20 DIAGNOSIS — Z8669 Personal history of other diseases of the nervous system and sense organs: Secondary | ICD-10-CM

## 2018-09-20 DIAGNOSIS — E213 Hyperparathyroidism, unspecified: Secondary | ICD-10-CM

## 2018-09-20 DIAGNOSIS — M19042 Primary osteoarthritis, left hand: Secondary | ICD-10-CM

## 2018-09-20 DIAGNOSIS — M7918 Myalgia, other site: Secondary | ICD-10-CM | POA: Diagnosis not present

## 2018-09-20 DIAGNOSIS — M7061 Trochanteric bursitis, right hip: Secondary | ICD-10-CM

## 2018-09-20 DIAGNOSIS — M722 Plantar fascial fibromatosis: Secondary | ICD-10-CM

## 2018-09-20 DIAGNOSIS — E559 Vitamin D deficiency, unspecified: Secondary | ICD-10-CM

## 2018-09-20 DIAGNOSIS — M7062 Trochanteric bursitis, left hip: Secondary | ICD-10-CM

## 2018-09-20 DIAGNOSIS — M19041 Primary osteoarthritis, right hand: Secondary | ICD-10-CM | POA: Diagnosis not present

## 2018-09-20 DIAGNOSIS — M19071 Primary osteoarthritis, right ankle and foot: Secondary | ICD-10-CM

## 2018-09-20 MED ORDER — DICLOFENAC SODIUM 1 % TD GEL: TRANSDERMAL | 2 refills | Status: DC

## 2018-09-22 LAB — PAIN MGMT, PROFILE 5 W/CONF, U
Amphetamines: NEGATIVE ng/mL (ref <500)
Barbiturates: NEGATIVE ng/mL (ref <300)
Benzodiazepines: NEGATIVE ng/mL (ref <100)
Cocaine Metabolite: NEGATIVE ng/mL (ref <150)
Creatinine: 67.8 mg/dL
Marijuana Metabolite: NEGATIVE ng/mL (ref <20)
Methadone Metabolite: NEGATIVE ng/mL (ref <100)
Opiates: NEGATIVE ng/mL (ref <100)
Oxidant: NEGATIVE ug/mL (ref <200)
Oxycodone: NEGATIVE ng/mL (ref <100)
pH: 6.8 (ref 4.5–9.0)

## 2018-09-22 LAB — PAIN MGMT, TRAMADOL W/MEDMATCH, U
Desmethyltramadol: NEGATIVE ng/mL (ref <100)
Tramadol: NEGATIVE ng/mL (ref <100)

## 2018-09-27 ENCOUNTER — Telehealth: Payer: Self-pay | Admitting: Rheumatology

## 2018-09-27 MED ORDER — TRAMADOL HCL 50 MG PO TABS: 50.0000 mg | ORAL_TABLET | Freq: Three times a day (TID) | ORAL | 0 refills | Status: DC | PRN

## 2018-09-27 NOTE — Telephone Encounter (Signed)
Patient called stating she had labwork last week and is checking on her prescription refill of Tramadol.  Patient's pharmacy is [facility] on [address]

## 2018-09-27 NOTE — Telephone Encounter (Signed)
Last Visit: 09/20/18 Next Visit: 03/21/19 UDS :09/20/18 Narc Agreement: 09/20/18  Okay to refill Tramadol?

## 2018-11-22 NOTE — Progress Notes (Signed)
Virtual Visit via Video Note The purpose of this virtual visit is to provide medical care while limiting exposure to the novel coronavirus.    Consent was obtained for video visit:  Yes Answered questions that patient had about telehealth interaction:  Yes I discussed the limitations, risks, security and privacy concerns of performing an evaluation and management service by telemedicine. I also discussed with the patient that there may be a patient responsible charge related to this service. The patient expressed understanding and agreed to proceed.  Pt location: Home Physician Location: Home Name of referring provider:  Gearldine Bienenstock, PA-C I connected with Brianna [name] at patients initiation/request on 11/25/2018 at  9:10 AM EDT by video enabled telemedicine application and verified that I am speaking with the correct person using two identifiers. Pt MRN:  [id number] Pt DOB:  1965-08-17 Video Participants:  Brianna [name]   History of Present Illness:  Brianna [name] is a 53 year old Caucasian woman with factor V Leiden gene mutation, mitral valve prolapse, myofascial pain syndrome and osteoarthritis who presents for migraines.   History supplemented by referring provider note.  Onset:  18 or 53 years old.  They eased off after having her children in her early 30s.  They became infrequent.  Over the past 5 years, they have gradually become more frequent   Location:  Bi-temporal/frontal region, sometimes in back of head Quality:  Throbbing, pounding if severe Intensity:  Usually 3-4/10 but severe are 10/10.  She denies new headache, thunderclap headache Aura:  When severe, she notes squiggly eyes Premonitory Phase:  no Postdrome:  A little tired Associated symptoms:  Photophobia, phonophobia.  Rarely nausea.  She denies associated vomiting, unilateral numbness or weakness. Duration:  2-3 hours (on a couple of occasions she goes to bed and they are still present but less intense when  she wakes up) Frequency:  2 to 3 days a week Frequency of abortive medication: 2 to 3 days a week Triggers:  Outside for prolonged period (sunlight or seasonal allergies), wet weather Relieving factors:  Lays down Activity:  aggravates  Current NSAIDS:  Ibuprofen Current analgesics:  Tylenol, tramadol (aggravates migraine) Current triptans:  none Current ergotamine:  none Current anti-emetic:  none Current muscle relaxants:  Robaxin Current anti-anxiolytic:  none Current sleep aide:  none Current Antihypertensive medications:  Nadolol  PRN Current Antidepressant medications:  none Current Anticonvulsant medications:  none Current anti-CGRP:  none Current Vitamins/Herbal/Supplements:  Magnesium , turmeric , D, cherry tart, MVI Current Antihistamines/Decongestants:  Zyrtec, Flonase Other therapy:  none Hormone/birth control:  none  Past NSAIDS:  none Past analgesics:  Midrin, darvocet Past abortive triptans:  none Past abortive ergotamine:  none Past muscle relaxants:  Flexeril Past anti-emetic:  none Past antihypertensive medications:  none Past antidepressant medications:  none Past anticonvulsant medications:  none Past anti-CGRP:  none Past vitamins/Herbal/Supplements:  none Past antihistamines/decongestants:  none Other past therapies:  none  Caffeine:  1 or 2 cups of tea daily.  No coffee Alcohol: occasional (4-5 times a year) Smoker: no Diet:  At least 3 16oz bottles of water daily.  Occasional soda.  Often skips meals. Exercise:  Walks 30 minutes a day Depression:  no; Anxiety: mild.  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 Bursitis of hip    bilateral  . Factor V Leiden ([facility])   . Fibromyalgia 2013    Dr. Corliss Skainseveshwar  . 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 Mitral valve prolapse   . Molluscum contagiosum   . Obesity   . Plantar fasciitis   . Tendonitis of elbow, right     Medications: Outpatient Encounter Medications as of 11/25/2018  Medication Sig  . cetirizine (ZYRTEC) 10 MG tablet Take 10 mg by mouth daily.    . cyanocobalamin 500 MCG tablet Take 500 mcg by mouth daily.  . diclofenac sodium (VOLTAREN) 1 % GEL Apply 2 g to 4 g to affected joints up to 4 times daily PRN.  . fluticasone (FLONASE) 50 MCG/ACT nasal spray daily.  Marland Kitchen. ibuprofen (ADVIL,MOTRIN) 100 MG/5ML suspension Take 200 mg by mouth every 8 (eight) hours as needed.    . Magnesium 500 MG CAPS Take by mouth daily.  . methocarbamol (ROBAXIN) 500 MG tablet TAKE 1 TABLET BY MOUTH AT 7 AM AND 1 TABLET AT 2 PM AS NEEDED  . Misc Natural Products (TART CHERRY ADVANCED PO) Take by mouth.  . Multiple Vitamins-Minerals (MULTIVITAMIN WITH MINERALS) tablet Take 1 tablet by mouth daily.    . nadolol (CORGARD) 40 MG tablet Take 40 mg by mouth daily.    Marland Kitchen. omeprazole (PRILOSEC) 20 MG capsule Take 20 mg by mouth daily.    . Probiotic Product (PROBIOTIC DAILY PO) Take by mouth daily.  . traMADol (ULTRAM) 50 MG tablet Take 1 tablet (50 mg total) by mouth 3 (three) times daily as needed.  . TURMERIC PO Take 450 mg by mouth daily.  . Vitamin D, Ergocalciferol, (DRISDOL) 50000 units CAPS capsule Take 1 capsule (50,000 Units total) by mouth every 14 (fourteen) days.   No facility-administered encounter medications on file as of 11/25/2018.     Allergies: Allergies  Allergen Reactions  . Z-Pak [Azithromycin]     GI upset  . Amoxicillin-Pot Clavulanate Rash  . Sulfa Antibiotics Rash    Family History: Family History  Problem Relation Age of Onset  . Colon cancer Sister 1846       pre-colon cancer-had surgery  . Heart disease Sister   . Graves' disease Sister   . Diabetes Father   . Hypertension Father   . Hyperlipidemia  Father   . Parkinson's disease Father   . COPD Mother   . Rheum arthritis Mother   . Hyperlipidemia Mother   . Heart attack Mother        01/2016  . Breast cancer Maternal Aunt   . Hyperparathyroidism Neg Hx     Social History: Social History   Socioeconomic History  . Marital status: Married    Spouse name: Not on file  . Number of children: Not on file  . Years of education: Not on file  . Highest education level: Not on file  Occupational History  . Not on file  Social Needs  . Financial resource strain: Not on file  . Food insecurity:    Worry: Not on file    Inability: Not on file  . Transportation needs:    Medical: Not on file    Non-medical: Not on file  Tobacco Use  . Smoking status: Never Smoker  . Smokeless tobacco: Never Used  Substance and Sexual Activity  . Alcohol use: Yes    Comment: occassional  . Drug use: No  . Sexual activity: Yes  Partners: Male    Birth control/protection: Other-see comments, Post-menopausal    Comment: vasectomy  Lifestyle  . Physical activity:    Days per week: Not on file    Minutes per session: Not on file  . Stress: Not on file  Relationships  . Social connections:    Talks on phone: Not on file    Gets together: Not on file    Attends religious service: Not on file    Active member of club or organization: Not on file    Attends meetings of clubs or organizations: Not on file    Relationship status: Not on file  . Intimate partner violence:    Fear of current or ex partner: Not on file    Emotionally abused: Not on file    Physically abused: Not on file    Forced sexual activity: Not on file  Other Topics Concern  . Not on file  Social History Narrative  . Not on file   Observations/Objective:   No vitals this visit. Alert and oriented.  Speech fluent and not dysarthric.  Language intact.  Eyes orthophoric on primary gaze and move in all directions.  Face symmetric.  Assessment and Plan:   Chronic  migraine without aura, without status migrainosus, not intractable Migraine with aura, without status migrainosus, not intractable  1.  For preventative management, start topiramate 25mg  at bedtime.  We can increase dose to 50mg  at bedtime in 4 weeks if needed. 2.  For abortive therapy, ibuprofen and/or acetaminophen 3.  Limit use of pain relievers to no more than 2 days out of week to prevent risk of rebound or medication-overuse headache. 4.  Keep headache diary 5.  Exercise, hydration, caffeine cessation, sleep hygiene, monitor for and avoid triggers 6.  Consider:  magnesium citrate 400mg  daily, riboflavin 400mg  daily, and coenzyme Q10 100mg  three times daily 7. Always keep in mind that currently taking a hormone or birth control may be a possible trigger or aggravating factor for migraine. 8. Follow up in 4 months   Follow Up Instructions:    -I discussed the assessment and treatment plan with the patient. The patient was provided an opportunity to ask questions and all were answered. The patient agreed with the plan and demonstrated an understanding of the instructions.   The patient was advised to call back or seek an in-person evaluation if the symptoms worsen or if the condition fails to improve as anticipated.  Cira Servant, DO

## 2018-11-25 ENCOUNTER — Encounter: Payer: Self-pay | Admitting: Neurology

## 2018-11-25 ENCOUNTER — Other Ambulatory Visit: Payer: Self-pay

## 2018-11-25 ENCOUNTER — Telehealth (INDEPENDENT_AMBULATORY_CARE_PROVIDER_SITE_OTHER): Payer: 59 | Admitting: Neurology

## 2018-11-25 DIAGNOSIS — G43709 Chronic migraine without aura, not intractable, without status migrainosus: Secondary | ICD-10-CM

## 2018-11-25 DIAGNOSIS — G43109 Migraine with aura, not intractable, without status migrainosus: Secondary | ICD-10-CM

## 2018-11-25 MED ORDER — TOPIRAMATE 25 MG PO TABS: 25.0000 mg | ORAL_TABLET | Freq: Every day | ORAL | 0 refills | Status: DC

## 2018-11-25 NOTE — Patient Instructions (Signed)
Migraine Recommendations: 1.  Start topiramate 25mg  at bedtime.  Call in 4 weeks with update and we can adjust dose if needed. 2.  Take ibuprofen and/or acetaminophen when you have a migraine 3.  Limit use of pain relievers to no more than 2 days out of the week.  These medications include acetaminophen, ibuprofen, triptans and narcotics.  This will help reduce risk of rebound headaches. 4.  Be aware of common food triggers such as processed sweets, processed foods with nitrites (such as deli meat, hot dogs, sausages), foods with MSG, alcohol (such as wine), chocolate, certain cheeses, certain fruits (dried fruits, bananas, pineapple), vinegar, diet soda. 4.  Avoid caffeine 5.  Routine exercise 6.  Proper sleep hygiene 7.  Stay adequately hydrated with water 8.  Keep a headache diary. 9.  Maintain proper stress management. 10.  Do not skip meals. 11.  Consider supplements:  Magnesium citrate 400mg  to 600mg  daily, riboflavin 400mg , Coenzyme Q 10 100mg  three times daily 12.  Follow up in 4 months.

## 2018-12-18 ENCOUNTER — Other Ambulatory Visit: Payer: Self-pay | Admitting: Neurology

## 2019-01-02 ENCOUNTER — Other Ambulatory Visit: Payer: Self-pay

## 2019-01-02 MED ORDER — NORTRIPTYLINE HCL 10 MG PO CAPS: 10.0000 mg | ORAL_CAPSULE | Freq: Every day | ORAL | 1 refills | Status: DC

## 2019-01-16 ENCOUNTER — Other Ambulatory Visit: Payer: Self-pay | Admitting: Neurology

## 2019-01-17 ENCOUNTER — Other Ambulatory Visit: Payer: Self-pay

## 2019-01-17 ENCOUNTER — Other Ambulatory Visit: Payer: Self-pay | Admitting: Neurology

## 2019-01-17 MED ORDER — NORTRIPTYLINE HCL 10 MG PO CAPS: 10.0000 mg | ORAL_CAPSULE | Freq: Every day | ORAL | 1 refills | Status: DC

## 2019-02-02 ENCOUNTER — Other Ambulatory Visit: Payer: Self-pay | Admitting: Neurology

## 2019-02-07 ENCOUNTER — Other Ambulatory Visit: Payer: Self-pay | Admitting: Physician Assistant

## 2019-02-07 NOTE — Telephone Encounter (Signed)
ok 

## 2019-02-07 NOTE — Telephone Encounter (Signed)
Last Visit: 09/20/2018 Next Visit: 03/21/2019 UDS: 09/20/2018 negative, patient takes sparingly. Narc Agreement: 09/20/2018  Last fill: 09/27/2018   Okay to refill tramadol?

## 2019-02-13 ENCOUNTER — Other Ambulatory Visit: Payer: Self-pay | Admitting: Obstetrics & Gynecology

## 2019-02-13 NOTE — Telephone Encounter (Signed)
Medication refill request: Vitamin D  Last AEX:  01-31-18 SM  Next AEX: 05-16-2019 Last MMG (if hormonal medication request): n/a Refill authorized: Today, please advise.   Medication pended for #6, 0RF. Please refill if appropriate.

## 2019-03-07 NOTE — Progress Notes (Signed)
Office Visit Note  Patient: Brianna [name]             Date of Birth: 12/20/1965           MRN: [id number]             PCP: Glenis Smoker, MD Referring: No ref. provider found Visit Date: 03/21/2019 Occupation: '@GUAROCC'$ @  Subjective:  Trochanteric bursitis bilaterally   History of Present Illness: Brianna [name] is a 53 y.o. female with history of myofascial pain syndrome and osteoarthritis.  She continues to take tramadol 50 mg 3 times daily PRN for pain relief and Robaxin 500 mg twice daily PRN for muscle spasms.  She takes nortriptyline at bedtime.  She presents today with bilateral trochanter bursitis.  She states that for a while she was not having any discomfort but it has flared up.  She has not been performing stretching exercises on a regular basis.  She has been experiencing pain at night especially when lying on her sides.  She states for the past 1 month she is also been experiencing intermittent right wrist pain.  She denies any joint swelling.  She states that shortly after her wrist pain started she fell which exacerbated the right wrist pain.  She says she is also been having right plantar fasciitis for the past several weeks.  She is tried icing and performing stretching exercises.  She continues with generalized muscle aches and muscle tenderness due to myofascial pain syndrome.  She has trapezius muscle tension and muscle tenderness bilaterally.  She has chronic fatigue related to insomnia.   Activities of Daily Living:  Patient reports morning stiffness for 1  hour.   Patient Reports nocturnal pain.  Difficulty dressing/grooming: Denies Difficulty climbing stairs: Denies Difficulty getting out of chair: Denies Difficulty using hands for taps, buttons, cutlery, and/or writing: Denies  Review of Systems  Constitutional: Positive for fatigue.  HENT: Negative for mouth sores, mouth dryness and nose dryness.   Eyes: Negative for pain, visual disturbance and  dryness.  Respiratory: Negative for cough, hemoptysis, shortness of breath and difficulty breathing.   Cardiovascular: Negative for chest pain, palpitations, hypertension and swelling in legs/feet.  Gastrointestinal: Negative for blood in stool, constipation and diarrhea.  Endocrine: Negative for increased urination.  Genitourinary: Negative for painful urination.  Musculoskeletal: Positive for arthralgias, joint pain, myalgias, morning stiffness, muscle tenderness and myalgias. Negative for joint swelling and muscle weakness.  Skin: Negative for color change, pallor, rash, hair loss, nodules/bumps, skin tightness, ulcers and sensitivity to sunlight.  Allergic/Immunologic: Negative for susceptible to infections.  Neurological: Negative for dizziness, numbness, headaches and weakness.  Hematological: Negative for swollen glands.  Psychiatric/Behavioral: Negative for depressed mood and sleep disturbance. The patient is not nervous/anxious.     PMFS History:  Patient Active Problem List   Diagnosis Date Noted  . Myofascial pain syndrome 08/17/2016  . HLA B27 (HLA B27 positive) 08/17/2016  . Trochanteric bursitis of both hips 08/17/2016  . History of migraine 08/17/2016  . Primary osteoarthritis of both hands 08/17/2016  . Primary osteoarthritis of both feet 08/17/2016  . Plantar fasciitis 08/17/2016  . Hyperparathyroidism ([place]) 09/20/2015  . 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 Bursitis of hip    bilateral  . Factor V Leiden ([place])   . Fibromyalgia 2013   Dr. Estanislado Pandy  . 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 Mitral valve prolapse   .  Molluscum contagiosum   . Obesity   . Plantar fasciitis   . Tendonitis of elbow, right     Family History  Problem Relation Age of Onset  . Colon cancer Sister 72       pre-colon cancer-had surgery  . Heart disease Sister   . Graves' disease Sister   . Diabetes Father   . Hypertension  Father   . Hyperlipidemia Father   . Parkinson's disease Father   . COPD Mother   . Rheum arthritis Mother   . Hyperlipidemia Mother   . Heart attack Mother        01/2016  . Breast cancer Maternal Aunt   . Hyperparathyroidism Neg Hx    Past Surgical History:  Procedure Laterality Date  . CESAREAN SECTION  1997, 2000  . [place]  . WISDOM TOOTH EXTRACTION     Social History   Social History Narrative   Patient is right-handed. She lives with her husband in a 2 level home. She drinks tea 1-2 x a day. And an occasional soda. She walks most days for 30 minutes.   Immunization History  Administered Date(s) Administered  . Tdap 07/30/2015     Objective: Vital Signs: Wt 223 lb 9.6 oz (101.4 kg)   LMP 12/22/2012   BMI 33.02 kg/m    Physical Exam Vitals signs and nursing note reviewed.  Constitutional:      Appearance: She is well-developed.  HENT:     Head: Normocephalic and atraumatic.  Eyes:     Conjunctiva/sclera: Conjunctivae normal.  Neck:     Musculoskeletal: Normal range of motion.  Cardiovascular:     Rate and Rhythm: Normal rate and regular rhythm.     Heart sounds: Normal heart sounds.  Pulmonary:     Effort: Pulmonary effort is normal.     Breath sounds: Normal breath sounds.  Abdominal:     General: Bowel sounds are normal.     Palpations: Abdomen is soft.  Lymphadenopathy:     Cervical: No cervical adenopathy.  Skin:    General: Skin is warm and dry.     Capillary Refill: Capillary refill takes less than 2 seconds.  Neurological:     Mental Status: She is alert and oriented to person, place, and time.  Psychiatric:        Behavior: Behavior normal.      Musculoskeletal Exam: C-spine, thoracic spine, and lumbar spine good ROM.  No midline spinal tenderness.  No SI joint tenderness.  Shoulder joints, elbow joints, wrist joints, MCPs, PIPs, and DIPs good ROM with no synovitis.  Complete fist formation bilaterally.  Hip  joints, knee joints, ankle joints, MTPs, PIPs, and DIPs good ROM with no synovitis.  No warmth or effusion of knee joints.  No tenderness or swelling of ankle joints. Right plantar fasciitis. Tenderness over bilateral trochanteric bursa.    CDAI Exam: CDAI Score: - Patient Global: -; Provider Global: - Swollen: -; Tender: - Joint Exam   No joint exam has been documented for this visit   There is currently no information documented on the homunculus. Go to the Rheumatology activity and complete the homunculus joint exam.  Investigation: No additional findings.  Imaging: No results found.  Recent Labs: Lab Results  Component Value Date   WBC 6.6 05/22/2017   HGB 14.1 05/22/2017   PLT 266 05/22/2017   NA 142 01/31/2018   K 4.2 01/31/2018   CL 104 01/31/2018   CO2 24 01/31/2018  GLUCOSE 90 01/31/2018   BUN 14 01/31/2018   CREATININE 0.86 01/31/2018   BILITOT 0.5 01/31/2018   ALKPHOS 157 (H) 01/31/2018   AST 16 01/31/2018   ALT 14 01/31/2018   PROT 7.1 01/31/2018   ALBUMIN 4.4 01/31/2018   CALCIUM 9.4 01/31/2018   GFRAA 89 01/31/2018    Speciality Comments: No specialty comments available.  Procedures:  No procedures performed Allergies: Z-pak [azithromycin], Amoxicillin-pot clavulanate, and Sulfa antibiotics   Assessment / Plan:     Visit Diagnoses: Myofascial pain syndrome -She has generalized muscle aches and muscle tenderness.  She is having trapezius muscle tension and muscle spasms.  She presents today with bilateral trochanteric bursitis.  She declined cortisone injections today.  She was encouraged to perform stretching exercises on a daily basis.  She was given a handout of exercises to perform.  She continues to take tramadol 50 mg 1 tablet 3 times daily as needed and Robaxin 500 mg twice daily as needed for pain and muscle spasms.  A refill of Robaxin was sent to the pharmacy today.  UDS and narcotic agreement were updated today as well.  She was encouraged to  stay active and exercise on a regular basis.  She continues to fatigue related to insomnia.  Good sleep hygiene was discussed.  She takes nortriptyline at bedtime. She will follow up in 6 months.   Medication monitoring encounter - Tramadol 50 mg TID prn for pain relief.  UDS and narcotic agreement will be updated today on 03/21/2019.- Plan: Pain Mgmt, Tramadol w/medMATCH, U, Pain Mgmt, Profile 5 w/Conf, U  Primary osteoarthritis of both hands: She has PIP and DIP synovial thickening consistent with osteoarthritis of bilateral hands.  She has no tenderness or synovitis.  She has complete fist formation bilaterally.  She has been experiencing intermittent right wrist pain for the past 1 month.  No tenderness or inflammation was noted.  She has good range of motion of the right wrist with no discomfort.  She was encouraged to use Voltaren gel topically as needed for pain relief.  If the pain persists or worsens she was advised to notify us.   Trochanteric bursitis of both hips: She has tenderness over bilateral trochanteric bursa.  She has been experiencing nocturnal pain when lying on her sides at night.  She was encouraged to perform stretching exercises on a daily basis.  She was given a handout of exercises to perform.  She declined cortisone injections today.  She was advised to notify us if she would like to return for injections in the future.  Primary osteoarthritis of both feet: She has no feet pain or joint swelling at this time.  She has been having symptoms of right plantar fasciitis intermittently.  She was given a handout of stretching exercises.    Plantar fasciitis-Right: She has tenderness of the right plantar fascia on exam.  No Achilles tendinitis was noted.  She has been using ice and performing rolling exercises.  She is given a handout of exercises to perform.  She declined a cortisone injection.  She was advised to notify us if her symptoms persist or worsen and she can return for an  injection.  We discussed importance of wearing proper fitting shoes.  Other medical conditions are listed as follows:   HLA B27 (HLA B27 positive)  Vitamin D deficiency  History of migraine  Hyperparathyroidism ([place])  Orders: Orders Placed This Encounter  Procedures  . Pain Mgmt, Tramadol w/medMATCH, U  . Pain Mgmt,  Profile 5 w/Conf, U   Meds ordered this encounter  Medications  . methocarbamol (ROBAXIN) 500 MG tablet    Sig: TAKE 1 TABLET BY MOUTH AT 7 AM AND 1 TABLET AT 2 PM AS NEEDED    Dispense:  60 tablet    Refill:  3    Follow-Up Instructions: Return in about 6 months (around 09/21/2019) for Myofascial pain syndrome , Osteoarthritis.   Ofilia Neas, PA-C  Note - This record has been created using Dragon software.  Chart creation errors have been sought, but may not always  have been located. Such creation errors do not reflect on  the standard of medical care.

## 2019-03-21 ENCOUNTER — Encounter: Payer: Self-pay | Admitting: Physician Assistant

## 2019-03-21 ENCOUNTER — Ambulatory Visit: Payer: 59 | Admitting: Physician Assistant

## 2019-03-21 ENCOUNTER — Other Ambulatory Visit: Payer: Self-pay

## 2019-03-21 VITALS — BP 121/77 | HR 64 | Resp 14 | Ht 69.0 in | Wt 223.6 lb

## 2019-03-21 DIAGNOSIS — Z5181 Encounter for therapeutic drug level monitoring: Secondary | ICD-10-CM

## 2019-03-21 DIAGNOSIS — E213 Hyperparathyroidism, unspecified: Secondary | ICD-10-CM

## 2019-03-21 DIAGNOSIS — M19042 Primary osteoarthritis, left hand: Secondary | ICD-10-CM

## 2019-03-21 DIAGNOSIS — E559 Vitamin D deficiency, unspecified: Secondary | ICD-10-CM

## 2019-03-21 DIAGNOSIS — M19041 Primary osteoarthritis, right hand: Secondary | ICD-10-CM

## 2019-03-21 DIAGNOSIS — Z8669 Personal history of other diseases of the nervous system and sense organs: Secondary | ICD-10-CM

## 2019-03-21 DIAGNOSIS — M7061 Trochanteric bursitis, right hip: Secondary | ICD-10-CM | POA: Diagnosis not present

## 2019-03-21 DIAGNOSIS — M19072 Primary osteoarthritis, left ankle and foot: Secondary | ICD-10-CM

## 2019-03-21 DIAGNOSIS — M7062 Trochanteric bursitis, left hip: Secondary | ICD-10-CM

## 2019-03-21 DIAGNOSIS — M7918 Myalgia, other site: Secondary | ICD-10-CM

## 2019-03-21 DIAGNOSIS — M722 Plantar fascial fibromatosis: Secondary | ICD-10-CM

## 2019-03-21 DIAGNOSIS — Z1589 Genetic susceptibility to other disease: Secondary | ICD-10-CM

## 2019-03-21 DIAGNOSIS — M19071 Primary osteoarthritis, right ankle and foot: Secondary | ICD-10-CM

## 2019-03-21 MED ORDER — METHOCARBAMOL 500 MG PO TABS: ORAL_TABLET | ORAL | 3 refills | Status: DC

## 2019-03-21 NOTE — Patient Instructions (Signed)
Plantar Fasciitis Rehab Ask your health care provider which exercises are safe for you. Do exercises exactly as told by your health care provider and adjust them as directed. It is normal to feel mild stretching, pulling, tightness, or discomfort as you do these exercises. Stop right away if you feel sudden pain or your pain gets worse. Do not begin these exercises until told by your health care provider. Stretching and range-of-motion exercises These exercises warm up your muscles and joints and improve the movement and flexibility of your foot. These exercises also help to relieve pain. Plantar fascia stretch  1. Sit with your left / right leg crossed over your opposite knee. 2. Hold your heel with one hand with that thumb near your arch. With your other hand, hold your toes and gently pull them back toward the top of your foot. You should feel a stretch on the bottom of your toes or your foot (plantar fascia) or both. 3. Hold this stretch for__________ seconds. 4. Slowly release your toes and return to the starting position. Repeat __________ times. Complete this exercise __________ times a day. Gastrocnemius stretch, standing This exercise is also called a calf (gastroc) stretch. It stretches the muscles in the back of the upper calf. 1. Stand with your hands against a wall. 2. Extend your left / right leg behind you, and bend your front knee slightly. 3. Keeping your heels on the floor and your back knee straight, shift your weight toward the wall. Do not arch your back. You should feel a gentle stretch in your upper left / right calf. 4. Hold this position for __________ seconds. Repeat __________ times. Complete this exercise __________ times a day. Soleus stretch, standing This exercise is also called a calf (soleus) stretch. It stretches the muscles in the back of the lower calf. 1. Stand with your hands against a wall. 2. Extend your left / right leg behind you, and bend your front  knee slightly. 3. Keeping your heels on the floor, bend your back knee and shift your weight slightly over your back leg. You should feel a gentle stretch deep in your lower calf. 4. Hold this position for __________ seconds. Repeat __________ times. Complete this exercise __________ times a day. Gastroc and soleus stretch, standing step This exercise stretches the muscles in the back of the lower leg. These muscles are in the upper calf (gastrocnemius) and the lower calf (soleus). 1. Stand with the ball of your left / right foot on a step. The ball of your foot is on the walking surface, right under your toes. 2. Keep your other foot firmly on the same step. 3. Hold on to the wall or a railing for balance. 4. Slowly lift your other foot, allowing your body weight to press your left / right heel down over the edge of the step. You should feel a stretch in your left / right calf. 5. Hold this position for __________ seconds. 6. Return both feet to the step. 7. Repeat this exercise with a slight bend in your left / right knee. Repeat __________ times with your left / right knee straight and __________ times with your left / right knee bent. Complete this exercise __________ times a day. Balance exercise This exercise builds your balance and strength control of your arch to help take pressure off your plantar fascia. Single leg stand If this exercise is too easy, you can try it with your eyes closed or while standing on a pillow. 1.   Without shoes, stand near a railing or in a doorway. You may hold on to the railing or door frame as needed. 2. Stand on your left / right foot. Keep your big toe down on the floor and try to keep your arch lifted. Do not let your foot roll inward. 3. Hold this position for __________ seconds. Repeat __________ times. Complete this exercise __________ times a day. This information is not intended to replace advice given to you by your health care provider. Make sure  you discuss any questions you have with your health care provider. Document Released: 07/10/2005 Document Revised: 10/31/2018 Document Reviewed: 05/08/2018 Elsevier Patient Education  2020 [employer] Iliotibial Bursitis Rehab Ask your health care provider which exercises are safe for you. Do exercises exactly as told by your health care provider and adjust them as directed. It is normal to feel mild stretching, pulling, tightness, or discomfort as you do these exercises. Stop right away if you feel sudden pain or your pain gets worse. Do not begin these exercises until told by your health care provider. Stretching and range-of-motion exercises These exercises warm up your muscles and joints and improve the movement and flexibility of your leg. These exercises also help to relieve pain and stiffness. Quadriceps stretch, prone  1. Lie on your abdomen (prone position) on a firm surface, such as a bed or padded floor. 2. Bend your left / right knee and reach back to hold your ankle or pant leg. If you cannot reach your ankle or pant leg, loop a belt around your foot and grab the belt instead. 3. Gently pull your heel toward your buttocks. Your knee should not slide out to the side. You should feel a stretch in the front of your thigh and knee (quadriceps). 4. Hold this position for __________ seconds. Repeat __________ times. Complete this exercise __________ times a day. Lunge This exercise stretches the muscle in the inner thigh (adductor). 1. Stand and spread your legs about 3 ft (1 m) apart. Put your left / right leg slightly back for balance. 2. Lean away from your left / right leg by bending your other knee and shifting your weight toward your bent knee. You may rest your hands on your thigh for balance. You should feel a stretch in your left / right inner thigh. 3. Hold this position for __________ seconds. Repeat __________ times. Complete this exercise __________ times a day. Hamstring  stretch, supine  1. Lie on your back (supine position). 2. Loop a belt or towel over the ball of your left / right foot. The ball of your foot is on the walking surface, right under your toes. 3. Straighten your left / right knee and slowly pull on the belt or towel to raise your leg. Stop when you feel a gentle stretch in the back of your left / right knee or thigh (hamstrings). ? Do not let your left / right knee bend. ? Keep your other leg flat on the floor. 4. Hold this position for __________ seconds. Repeat __________ times. Complete this exercise __________ times a day. Strengthening exercises These exercises build strength and endurance in your leg. Endurance is the ability to use your muscles for a long time, even after they get tired. Wall slides This exercise strengthens the muscles in the front of your thigh and knee (quadriceps). 1. Lean your back against a smooth wall or door, and walk your feet out 18-24 inches (46-61 cm) from it. 2. Place your feet  hip-width apart. 3. Slowly slide down the wall or door until your knees bend as far as told by your health care provider. Keep your knees over your heels, not your toes. Keep your knees in line with your hips. 4. Hold this position for __________ seconds. 5. Use the muscles in the front of your thigh to push yourself up to the standing position. 6. Rest for __________ seconds after each repetition. Repeat __________ times. Complete this exercise __________ times a day. Straight leg raises, side-lying This exercise is sometimes called a hip abductor exercise. It strengthens the muscles that rotate the leg at the hip and move it away from your body (hip abductors). 1. Lie on your side with your left / right leg in the top position. Lie so your head, shoulder, hip, and knee line up. Bend your bottom knee slightly to help you balance. 2. Lift your top leg 4-6 inches (10-15 cm) while keeping your toes pointed straight ahead. 3. Hold this  position for __________ seconds. 4. Slowly lower your leg to the starting position. 5. Let your muscles relax completely after each repetition. Repeat __________ times. Complete this exercise __________ times a day. Straight leg raises, prone This exercise strengthens the muscles that move the hips (hip extensors). 1. Lie on your abdomen (prone position) on a firm surface. You can put a pillow under your hips if that is more comfortable for your lower back. 2. Squeeze your buttocks muscles and lift your left / right leg about 4-6 inches (10-15 cm). Keep your knee straight as you lift your leg. 3. Hold this position for __________ seconds. 4. Slowly lower your leg to the starting position. 5. Let your muscles relax completely after each repetition. Repeat __________ times. Complete this exercise __________ times a day. Bridge This exercise strengthens the muscles that move the hips (hip extensors). 1. Lie on your back on a firm surface with your knees bent and your feet flat on the floor. 2. Tighten your buttocks muscles and lift your bottom off the floor until the trunk of your body is level with your thighs. ? Do not arch your back. ? You should feel the muscles working in your buttocks and the back of your thighs. If you do not feel these muscles, slide your feet 1-2 inches (2.5-5 cm) farther away from your buttocks. 3. Hold this position for __________ seconds. 4. Slowly lower your hips to the starting position. 5. Let your muscles relax completely after each repetition. 6. If this exercise is too easy, try doing it with your arms crossed over your chest. Repeat __________ times. Complete this exercise __________ times a day. This information is not intended to replace advice given to you by your health care provider. Make sure you discuss any questions you have with your health care provider. Document Released: 07/10/2005 Document Revised: 10/31/2018 Document Reviewed: 09/16/2018  Elsevier Patient Education  2020 [employer] Hip Bursitis Rehab Ask your health care provider which exercises are safe for you. Do exercises exactly as told by your health care provider and adjust them as directed. It is normal to feel mild stretching, pulling, tightness, or discomfort as you do these exercises. Stop right away if you feel sudden pain or your pain gets worse. Do not begin these exercises until told by your health care provider. Stretching exercise This exercise warms up your muscles and joints and improves the movement and flexibility of your hip. This exercise also helps to relieve pain and stiffness. Iliotibial band  stretch An iliotibial band is a strong band of muscle tissue that runs from the outer side of your hip to the outer side of your thigh and knee. 5. Lie on your side with your left / right leg in the top position. 6. Bend your left / right knee and grab your ankle. Stretch out your bottom arm to help you balance. 7. Slowly bring your knee back so your thigh is behind your body. 8. Slowly lower your knee toward the floor until you feel a gentle stretch on the outside of your left / right thigh. If you do not feel a stretch and your knee will not fall farther, place the heel of your other foot on top of your knee and pull your knee down toward the floor with your foot. 9. Hold this position for __________ seconds. 10. Slowly return to the starting position. Repeat __________ times. Complete this exercise __________ times a day. Strengthening exercises These exercises build strength and endurance in your hip and pelvis. Endurance is the ability to use your muscles for a long time, even after they get tired. Bridge This exercise strengthens the muscles that move your thigh backward (hip extensors). 1. Lie on your back on a firm surface with your knees bent and your feet flat on the floor. 2. Tighten your buttocks muscles and lift your buttocks off the floor until your  trunk is level with your thighs. ? Do not arch your back. ? You should feel the muscles working in your buttocks and the back of your thighs. If you do not feel these muscles, slide your feet 1-2 inches (2.5-5 cm) farther away from your buttocks. ? If this exercise is too easy, try doing it with your arms crossed over your chest. 3. Hold this position for __________ seconds. 4. Slowly lower your hips to the starting position. 5. Let your muscles relax completely after each repetition. Repeat __________ times. Complete this exercise __________ times a day. Squats This exercise strengthens the muscles in front of your thigh and knee (quadriceps). 1. Stand in front of a table, with your feet and knees pointing straight ahead. You may rest your hands on the table for balance but not for support. 2. Slowly bend your knees and lower your hips like you are going to sit in a chair. ? Keep your weight over your heels, not over your toes. ? Keep your lower legs upright so they are parallel with the table legs. ? Do not let your hips go lower than your knees. ? Do not bend lower than told by your health care provider. ? If your hip pain increases, do not bend as low. 3. Hold the squat position for __________ seconds. 4. Slowly push with your legs to return to standing. Do not use your hands to pull yourself to standing. Repeat __________ times. Complete this exercise __________ times a day. Hip hike 8. Stand sideways on a bottom step. Stand on your left / right leg with your other foot unsupported next to the step. You can hold on to the railing or wall for balance if needed. 9. Keep your knees straight and your torso square. Then lift your left / right hip up toward the ceiling. 10. Hold this position for __________ seconds. 11. Slowly let your left / right hip lower toward the floor, past the starting position. Your foot should get closer to the floor. Do not lean or bend your knees. Repeat  __________ times. Complete this exercise __________  times a day. Single leg stand 1. Without shoes, stand near a railing or in a doorway. You may hold on to the railing or door frame as needed for balance. 2. Squeeze your left / right buttock muscles, then lift up your other foot. ? Do not let your left / right hip push out to the side. ? It is helpful to stand in front of a mirror for this exercise so you can watch your hip. 3. Hold this position for __________ seconds. Repeat __________ times. Complete this exercise __________ times a day. This information is not intended to replace advice given to you by your health care provider. Make sure you discuss any questions you have with your health care provider. Document Released: 08/17/2004 Document Revised: 11/04/2018 Document Reviewed: 11/04/2018 Elsevier Patient Education  2020 [employer].

## 2019-03-23 LAB — PAIN MGMT, PROFILE 5 W/CONF, U
Amphetamines: NEGATIVE ng/mL
Barbiturates: NEGATIVE ng/mL
Benzodiazepines: NEGATIVE ng/mL
Cocaine Metabolite: NEGATIVE ng/mL
Creatinine: 71.3 mg/dL
Marijuana Metabolite: NEGATIVE ng/mL
Methadone Metabolite: NEGATIVE ng/mL
Opiates: NEGATIVE ng/mL
Oxidant: NEGATIVE ug/mL
Oxycodone: NEGATIVE ng/mL
pH: 5.2 (ref 4.5–9.0)

## 2019-03-23 LAB — PAIN MGMT, TRAMADOL W/MEDMATCH, U
Desmethyltramadol: 677 ng/mL
Tramadol: 1348 ng/mL

## 2019-03-24 NOTE — Progress Notes (Signed)
UDS is consistent with treatment.

## 2019-03-28 ENCOUNTER — Ambulatory Visit: Payer: 59 | Admitting: Neurology

## 2019-04-07 ENCOUNTER — Other Ambulatory Visit: Payer: Self-pay | Admitting: Family Medicine

## 2019-04-07 DIAGNOSIS — Z1231 Encounter for screening mammogram for malignant neoplasm of breast: Secondary | ICD-10-CM

## 2019-04-08 DIAGNOSIS — J342 Deviated nasal septum: Secondary | ICD-10-CM | POA: Insufficient documentation

## 2019-04-08 DIAGNOSIS — R0981 Nasal congestion: Secondary | ICD-10-CM | POA: Insufficient documentation

## 2019-04-08 DIAGNOSIS — J343 Hypertrophy of nasal turbinates: Secondary | ICD-10-CM | POA: Insufficient documentation

## 2019-05-13 ENCOUNTER — Other Ambulatory Visit: Payer: Self-pay

## 2019-05-16 ENCOUNTER — Other Ambulatory Visit: Payer: Self-pay

## 2019-05-16 ENCOUNTER — Ambulatory Visit: Payer: 59 | Admitting: Obstetrics & Gynecology

## 2019-05-16 ENCOUNTER — Encounter: Payer: Self-pay | Admitting: Obstetrics & Gynecology

## 2019-05-16 ENCOUNTER — Other Ambulatory Visit (HOSPITAL_COMMUNITY)
Admission: RE | Admit: 2019-05-16 | Discharge: 2019-05-16 | Disposition: A | Discharge status: Home / Self Care | Payer: 59 | Source: Ambulatory Visit | Attending: Obstetrics & Gynecology | Admitting: Obstetrics & Gynecology

## 2019-05-16 VITALS — BP 96/70 | HR 60 | Temp 97.2°F | Ht 67.0 in | Wt 222.6 lb

## 2019-05-16 DIAGNOSIS — B977 Papillomavirus as the cause of diseases classified elsewhere: Secondary | ICD-10-CM | POA: Insufficient documentation

## 2019-05-16 DIAGNOSIS — Z124 Encounter for screening for malignant neoplasm of cervix: Secondary | ICD-10-CM | POA: Insufficient documentation

## 2019-05-16 DIAGNOSIS — Z0000 Encounter for general adult medical examination without abnormal findings: Secondary | ICD-10-CM

## 2019-05-16 DIAGNOSIS — Z01419 Encounter for gynecological examination (general) (routine) without abnormal findings: Secondary | ICD-10-CM | POA: Diagnosis not present

## 2019-05-16 MED ORDER — VITAMIN D (ERGOCALCIFEROL) 1.25 MG (50000 UNIT) PO CAPS: 50000.0000 [IU] | ORAL_CAPSULE | ORAL | 4 refills | Status: DC

## 2019-05-16 NOTE — Progress Notes (Signed)
53 y.o. [id number] Married White or Caucasian female here for annual exam.  Denies vaginal bleeding.     Saw ENT in August with Dr. Doran Heater.  Procedure to possibly help discussed.  Pt is going to have this done eventually but now right now with Covid.  She also had a telehealth visit with Dr. Everlena Cooper due to migraines.  She tried Topamax but that didn't seem to work.   PCP:  Dr. Chanetta Marshall.  Patient's last menstrual period was 12/22/2012.          Sexually active: Yes.    The current method of family planning is post menopausal status.    Exercising: Yes.    walking Smoker:  no  Health Maintenance: Pap:  01/31/18 Neg. HR HPV:neg   11/16/16 neg  History of abnormal Pap:  Yes, 2014 HR HPV #18/45 +detected  MMG:  05/03/18 BIRADS1:neg. Has appt 05/22/19 Colonoscopy:  09/24/15 normal. F/u 7 years BMD:   Never TDaP:  2017 Pneumonia vaccine(s):  n/a Shingrix:   Had 1.  Pt advised to just finish vaccination series. Flu vacc: 04/22/19 Hep C testing: 11/16/16 neg  Screening Labs: here today - fasting today    reports that she has never smoked. She has never used smokeless tobacco. She reports current alcohol use. She reports that she does not use drugs.  Past Medical History:  Diagnosis Date  . Bursitis of hip    bilateral  . Deviated nasal septum   . Factor V Leiden (HCC)   . Fibromyalgia 2013   Dr. Corliss Skains  . 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 Mitral valve prolapse   . Molluscum contagiosum   . Obesity   . Plantar fasciitis   . Tendonitis of elbow, right     Past Surgical History:  Procedure Laterality Date  . CESAREAN SECTION  1997, 2000  . TEMPOROMANDIBULAR JOINT SURGERY  1988  . WISDOM TOOTH EXTRACTION      Current Outpatient Medications  Medication Sig Dispense Refill  . cetirizine (ZYRTEC) 10 MG tablet Take 10 mg by mouth daily.      . Coenzyme Q10 (CO Q 10 PO) Take by mouth daily.    . cyanocobalamin 500 MCG tablet Take 500 mcg by mouth daily.    .  fluticasone (FLONASE) 50 MCG/ACT nasal spray daily.    . Magnesium 500 MG CAPS Take by mouth daily.    . methocarbamol (ROBAXIN) 500 MG tablet TAKE 1 TABLET BY MOUTH AT 7 AM AND 1 TABLET AT 2 PM AS NEEDED 60 tablet 3  . Misc Natural Products (TART CHERRY ADVANCED PO) Take by mouth.    . Multiple Vitamins-Minerals (MULTIVITAMIN WITH MINERALS) tablet Take 1 tablet by mouth daily.      . nadolol (CORGARD) 40 MG tablet Take 40 mg by mouth daily.      Marland Kitchen omeprazole (PRILOSEC) 20 MG capsule Take 20 mg by mouth daily.      . Probiotic Product (PROBIOTIC DAILY PO) Take by mouth daily.    . traMADol (ULTRAM) 50 MG tablet TAKE 1 TABLET (50 MG TOTAL) BY MOUTH 3 (THREE) TIMES DAILY AS NEEDED. 90 tablet 0  . TURMERIC PO Take 450 mg by mouth daily.    . Vitamin D, Ergocalciferol, (DRISDOL) 1.25 MG (50000 UT) CAPS capsule TAKE 1 CAPSULE (50,000 UNITS TOTAL) BY MOUTH EVERY 14 (FOURTEEN) DAYS. 6 capsule 0   No current facility-administered medications for this visit.     Family History  Problem Relation Age of Onset  . Colon cancer Sister 82       pre-colon cancer-had surgery  . Heart disease Sister   . Graves' disease Sister   . Hashimoto's thyroiditis Sister   . Diabetes Father   . Hypertension Father   . Hyperlipidemia Father   . Parkinson's disease Father   . COPD Mother   . Rheum arthritis Mother   . Hyperlipidemia Mother   . Heart attack Mother        01/2016  . Breast cancer Maternal Aunt   . Hyperparathyroidism Neg Hx     Review of Systems  All other systems reviewed and are negative.   Exam:   BP 96/70   Pulse 60   Temp (!) 97.2 F (36.2 C) (Temporal)   Ht 5\' 7"  (1.702 m)   Wt 222 lb 9.6 oz (101 kg)   LMP 12/22/2012   BMI 34.86 kg/m   Height:   Height: 5\' 7"  (170.2 cm)  Ht Readings from Last 3 Encounters:  05/16/19 5\' 7"  (1.702 m)  03/21/19 5\' 9"  (1.753 m)  11/25/18 5\' 9"  (1.753 m)    General appearance: alert, cooperative and appears stated age Head: Normocephalic,  without obvious abnormality, atraumatic Neck: no adenopathy, supple, symmetrical, trachea midline and thyroid normal to inspection and palpation Lungs: clear to auscultation bilaterally Breasts: normal appearance, no masses or tenderness Heart: regular rate and rhythm Abdomen: soft, non-tender; bowel sounds normal; no masses,  no organomegaly Extremities: extremities normal, atraumatic, no cyanosis or edema Skin: Skin color, texture, turgor normal. No rashes or lesions Lymph nodes: Cervical, supraclavicular, and axillary nodes normal. No abnormal inguinal nodes palpated Neurologic: Grossly normal   Pelvic: External genitalia:  no lesions              Urethra:  normal appearing urethra with no masses, tenderness or lesions              Bartholins and Skenes: normal                 Vagina: normal appearing vagina with normal color and discharge, no lesions              Cervix: no lesions              Pap taken: Yes.   Bimanual Exam:  Uterus:  normal size, contour, position, consistency, mobility, non-tender              Adnexa: normal adnexa and no mass, fullness, tenderness               Rectovaginal: Confirms               Anus:  normal sphincter tone, no lesions  Chaperone was present for exam.  A:  Well Woman with normal exam PMP, no HRT H/o + HR HPV in 2014 Myofascial pain syndrome  H/o Vit D def with elevated PTH (that normalized after normalized Vit D) Family hx of precancerous polyp with sister  P:   Mammogram guidelines reviewed.  Doing yearly. pap smear obtained today per pt request.  Neg pap and neg HR HPV 2019.  Guidelines reviewed.  She does desires a pap smear today Colonoscopy follow up due 2017 RF for Vit D 50K every 14 days.  #6/4RF CBC ,CMP, lipids, TSH and Vit D obtained today Finishing shingrix vaccination discussed.  guidelines to just finish reviewed.   Return annually or prn

## 2019-05-16 NOTE — Patient Instructions (Signed)
[facility]patient [facility] at [facility] [address] [place],  [place]  [address]  Main: [phone]  Sunday:Closed Monday:7:30 AM - 6:00 PM Tuesday:7:30 AM - 6:00 PM Wednesday:7:30 AM - 6:00 PM Thursday:7:30 AM - 6:00 PM Friday:7:30 AM - 6:00 PM Saturday:Closed 

## 2019-05-17 LAB — COMPREHENSIVE METABOLIC PANEL
ALT: 16 IU/L (ref 0–32)
AST: 19 IU/L (ref 0–40)
Albumin/Globulin Ratio: 1.8 (ref 1.2–2.2)
Albumin: 4.6 g/dL (ref 3.8–4.9)
Alkaline Phosphatase: 182 IU/L — ABNORMAL HIGH (ref 39–117)
BUN/Creatinine Ratio: 19 (ref 9–23)
BUN: 14 mg/dL (ref 6–24)
Bilirubin Total: 0.6 mg/dL (ref 0.0–1.2)
CO2: 24 mmol/L (ref 20–29)
Calcium: 9.6 mg/dL (ref 8.7–10.2)
Chloride: 105 mmol/L (ref 96–106)
Creatinine, Ser: 0.72 mg/dL (ref 0.57–1.00)
GFR calc Af Amer: 110 mL/min/{1.73_m2} (ref >59)
GFR calc non Af Amer: 95 mL/min/{1.73_m2} (ref >59)
Globulin, Total: 2.5 g/dL (ref 1.5–4.5)
Glucose: 82 mg/dL (ref 65–99)
Potassium: 4.5 mmol/L (ref 3.5–5.2)
Sodium: 143 mmol/L (ref 134–144)
Total Protein: 7.1 g/dL (ref 6.0–8.5)

## 2019-05-17 LAB — CBC
Hematocrit: 42.2 % (ref 34.0–46.6)
Hemoglobin: 13.6 g/dL (ref 11.1–15.9)
MCH: 30 pg (ref 26.6–33.0)
MCHC: 32.2 g/dL (ref 31.5–35.7)
MCV: 93 fL (ref 79–97)
Platelets: 275 10*3/uL (ref 150–450)
RBC: 4.54 x10E6/uL (ref 3.77–5.28)
RDW: 12.6 % (ref 11.7–15.4)
WBC: 5.8 10*3/uL (ref 3.4–10.8)

## 2019-05-17 LAB — LIPID PANEL
Chol/HDL Ratio: 2.8 ratio (ref 0.0–4.4)
Cholesterol, Total: 206 mg/dL — ABNORMAL HIGH (ref 100–199)
HDL: 74 mg/dL (ref >39)
LDL Chol Calc (NIH): 123 mg/dL — ABNORMAL HIGH (ref 0–99)
Triglycerides: 47 mg/dL (ref 0–149)
VLDL Cholesterol Cal: 9 mg/dL (ref 5–40)

## 2019-05-17 LAB — VITAMIN D 25 HYDROXY (VIT D DEFICIENCY, FRACTURES): Vit D, 25-Hydroxy: 37.1 ng/mL (ref 30.0–100.0)

## 2019-05-17 LAB — TSH: TSH: 0.923 u[IU]/mL (ref 0.450–4.500)

## 2019-05-20 LAB — CYTOLOGY - PAP: Diagnosis: NEGATIVE

## 2019-05-22 ENCOUNTER — Ambulatory Visit: Payer: 59

## 2019-05-23 ENCOUNTER — Other Ambulatory Visit: Payer: Self-pay

## 2019-05-23 ENCOUNTER — Ambulatory Visit
Admission: RE | Admit: 2019-05-23 | Discharge: 2019-05-23 | Disposition: A | Discharge status: Home / Self Care | Payer: 59 | Source: Ambulatory Visit | Attending: Family Medicine | Admitting: Family Medicine

## 2019-05-23 DIAGNOSIS — Z1231 Encounter for screening mammogram for malignant neoplasm of breast: Secondary | ICD-10-CM

## 2019-06-03 ENCOUNTER — Ambulatory Visit: Payer: 59 | Admitting: Cardiovascular Disease

## 2019-06-03 ENCOUNTER — Other Ambulatory Visit: Payer: Self-pay

## 2019-06-03 ENCOUNTER — Encounter: Payer: Self-pay | Admitting: Cardiovascular Disease

## 2019-06-03 VITALS — BP 113/73 | HR 56 | Temp 98.4°F | Ht 69.0 in | Wt 223.6 lb

## 2019-06-03 DIAGNOSIS — R002 Palpitations: Secondary | ICD-10-CM

## 2019-06-03 NOTE — Patient Instructions (Signed)
Medication Instructions:  No changes *If you need a refill on your cardiac medications before your next appointment, please call your pharmacy*  Lab Work: None ordered If you have labs (blood work) drawn today and your tests are completely normal, you will receive your results only by: Marland Kitchen MyChart Message (if you have MyChart) OR . A paper copy in the mail If you have any lab test that is abnormal or we need to change your treatment, we will call you to review the results.  Testing/Procedures: Your physician has recommended that you wear an 14 day event monitor. Event monitors are medical devices that record the heart's electrical activity. Doctors most often [place] these monitors to diagnose arrhythmias. Arrhythmias are problems with the speed or rhythm of the heartbeat. The monitor is a small, portable device. You can wear one while you do your normal daily activities. This is usually used to diagnose what is causing palpitations/syncope (passing out).   Follow-Up: At [facility], you and your health needs are our priority.  As part of our continuing mission to provide you with exceptional heart care, we have created designated Provider Care Teams.  These Care Teams include your primary Cardiologist (physician) and Advanced Practice Providers (APPs -  Physician Assistants and Nurse Practitioners) who all work together to provide you with the care you need, when you need it.  Your next appointment:   12 months  The format for your next appointment:   In Person  Provider:   Sanda Klein, MD

## 2019-06-03 NOTE — Progress Notes (Addendum)
Cardiology Consultation Note:    Date:  06/04/2019   ID:  MISCHELL Brianna [name], DOB 1965/11/08, MRN [id number]  PCP:  Glenis Smoker, MD  Cardiologist:  No primary care provider on file.  Electrophysiologist:  None   Referring MD: Glenis Smoker, * MD  No chief complaint on file. Brianna [name] is a 53 y.o. female who is being seen today for the evaluation of palpitations at the request of Glenis Smoker, *MD   History of Present Illness:    Brianna [name] is a 53 y.o. female with a hx of questionable diagnosis of mitral valve prolapse, fibromyalgia, factor V Leiden without clinical thromboembolic events, referred for palpitations.  Her palpitations are often associated with periods of increased emotional stress or when she uses Sudafed.  She describes both isolated skipped beats as well as abrupt onset of sustained rapid heartbeats that also have abrupt termination.  Taking a very deep breath sometimes interrupts daily rapid palpitations.  She thinks they are irregular.  They are not associated with dizziness or syncope, chest pain or dyspnea or other cardiovascular complaints.  Evaluation for palpitations in the late 80s led to a diagnosis of mitral valve prolapse, but an echocardiogram in 2016 did not confirm this.  The mitral valve appears structurally normal and showed only mild regurgitation left ventricular ejection fraction was 55 to 60%.  The study was interpreted showing pseudonormal mitral inflow pattern, but I believe that her mitral annulus Doppler velocities are probably normal (normal diastolic function).  The left atrium was mildly dilated.  Past Medical History:  Diagnosis Date  . Bursitis of hip    bilateral  . Deviated nasal septum   . Factor V Leiden ([place])   . Fibromyalgia 2013   Dr. Estanislado Pandy  . 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 Mitral valve prolapse   . Molluscum contagiosum   . Obesity   . Plantar fasciitis   . Tendonitis of  elbow, right     Past Surgical History:  Procedure Laterality Date  . CESAREAN SECTION  1997, 2000  . [place]  . WISDOM TOOTH EXTRACTION      Current Medications: Current Meds  Medication Sig  . cetirizine (ZYRTEC) 10 MG tablet Take 10 mg by mouth daily.    . Coenzyme Q10 (CO Q 10 PO) Take by mouth daily.  . cyanocobalamin 500 MCG tablet Take 500 mcg by mouth daily.  . fluticasone (FLONASE) 50 MCG/ACT nasal spray daily.  . Magnesium 500 MG CAPS Take by mouth daily.  . methocarbamol (ROBAXIN) 500 MG tablet TAKE 1 TABLET BY MOUTH AT 7 AM AND 1 TABLET AT 2 PM AS NEEDED  . Misc Natural Products (TART CHERRY ADVANCED PO) Take by mouth.  . Multiple Vitamins-Minerals (MULTIVITAMIN WITH MINERALS) tablet Take 1 tablet by mouth daily.    . nadolol (CORGARD) 40 MG tablet Take 40 mg by mouth daily.    Marland Kitchen omeprazole (PRILOSEC) 20 MG capsule Take 20 mg by mouth daily.    . Probiotic Product (PROBIOTIC DAILY PO) Take by mouth daily.  . traMADol (ULTRAM) 50 MG tablet TAKE 1 TABLET (50 MG TOTAL) BY MOUTH 3 (THREE) TIMES DAILY AS NEEDED.  Marland Kitchen TURMERIC PO Take 450 mg by mouth daily.  . Vitamin D, Ergocalciferol, (DRISDOL) 1.25 MG (50000 UT) CAPS capsule Take 1 capsule (50,000 Units total) by mouth every 14 (fourteen) days.     Allergies:   Z-pak [azithromycin],  Amoxicillin-pot clavulanate, and Sulfa antibiotics   Social History   Socioeconomic History  . Marital status: Married    Spouse name: Casimiro Needle  . Number of children: 2  . Years of education: Not on file  . Highest education level: Associate degree: academic program  Occupational History  . Not on file  Social Needs  . Financial resource strain: Not on file  . Food insecurity    Worry: Not on file    Inability: Not on file  . Transportation needs    Medical: Not on file    Non-medical: Not on file  Tobacco Use  . Smoking status: Never Smoker  . Smokeless tobacco: Never Used  Substance and Sexual  Activity  . Alcohol use: Yes    Comment: rarely  . Drug use: No  . Sexual activity: Yes    Partners: Male    Birth control/protection: Other-see comments, Post-menopausal    Comment: vasectomy  Lifestyle  . Physical activity    Days per week: Not on file    Minutes per session: Not on file  . Stress: Not on file  Relationships  . Social Musician on phone: Not on file    Gets together: Not on file    Attends religious service: Not on file    Active member of club or organization: Not on file    Attends meetings of clubs or organizations: Not on file    Relationship status: Not on file  Other Topics Concern  . Not on file  Social History Narrative   Patient is right-handed. She lives with her husband in a 2 level home. She drinks tea 1-2 x a day. And an occasional soda. She walks most days for 30 minutes.     Family History: The patient's family history includes Breast cancer in her maternal aunt; COPD in her mother; Colon cancer (age of onset: 28) in her sister; Diabetes in her father; Luiz Blare' disease in her sister; Hashimoto's thyroiditis in her sister; Heart attack in her mother; Heart disease in her sister; Hyperlipidemia in her father and mother; Hypertension in her father; Parkinson's disease in her father; Rheum arthritis in her mother. There is no history of Hyperparathyroidism.  ROS:   Please see the history of present illness.     All other systems reviewed and are negative.  EKGs/Labs/Other Studies Reviewed:    The following studies were reviewed today: Notes from Dr. Chanetta Marshall Echocardiogram from 2016  EKG:  EKG is  ordered today.  The ekg ordered today demonstrates normal sinus rhythm, normal tracing, no evidence of preexcitation, QTC 410 ms  Recent Labs: 05/16/2019: ALT 16; BUN 14; Creatinine, Ser 0.72; Hemoglobin 13.6; Platelets 275; Potassium 4.5; Sodium 143; TSH 0.923  Recent Lipid Panel    Component Value Date/Time   CHOL 206 (H) 05/16/2019  1143   TRIG 47 05/16/2019 1143   HDL 74 05/16/2019 1143   CHOLHDL 2.8 05/16/2019 1143   CHOLHDL 2.9 11/16/2016 1012   VLDL 11 11/16/2016 1012   LDLCALC 123 (H) 05/16/2019 1143    Physical Exam:    VS:  BP 113/73   Pulse (!) 56   Temp 98.4 F (36.9 C)   Ht 5\' 9"  (1.753 m)   Wt 223 lb 9.6 oz (101.4 kg)   LMP 12/22/2012   SpO2 98%   BMI 33.02 kg/m     Wt Readings from Last 3 Encounters:  06/03/19 223 lb 9.6 oz (101.4 kg)  05/16/19 222 lb  9.6 oz (101 kg)  03/21/19 223 lb 9.6 oz (101.4 kg)     GEN: Mildly obese, well nourished, well developed in no acute distress HEENT: Normal NECK: No JVD; No carotid bruits LYMPHATICS: No lymphadenopathy CARDIAC: RRR, no murmurs, rubs, gallops RESPIRATORY:  Clear to auscultation without rales, wheezing or rhonchi  ABDOMEN: Soft, non-tender, non-distended MUSCULOSKELETAL:  No edema; No deformity  SKIN: Warm and dry NEUROLOGIC:  Alert and oriented x 3 PSYCHIATRIC:  Normal affect   ASSESSMENT:    1. Palpitations    PLAN:    In order of problems listed above:  1. 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 Greer PickerelMcMillan is describing AV node reentry tachycardia.  Her episodes start and stop abruptly and seem to improve if she takes a deep breath (possibly an equivalent of a vagal maneuver/Valsalva maneuver).  The episodes are not particularly symptomatic.  She is already taking a beta-blocker and higher doses will not be possible due to underlying sinus bradycardia.  Before we decide on whether other treatments are necessary or safe, will be important to catch the arrhythmia in the act.  We will have her check a 2 week arrhythmia monitor.  She might consider purchasing a commercial monitor such as a Scientist, research (physical sciences)Kardia device.  Briefly discussed the option for ablation therapy if we identify a reentrant tachycardia.  Her physical exam was normal and her echo in 2016 was pretty conclusive, no evidence of meaningful structural heart disease   Medication  Adjustments/Labs and Tests Ordered: Current medicines are reviewed at length with the patient today.  Concerns regarding medicines are outlined above.  Orders Placed This Encounter  Procedures  . LONG TERM MONITOR (3-14 DAYS)  . EKG 12-Lead   No orders of the defined types were placed in this encounter.   Patient Instructions  Medication Instructions:  No changes *If you need a refill on your cardiac medications before your next appointment, please call your pharmacy*  Lab Work: None ordered If you have labs (blood work) drawn today and your tests are completely normal, you will receive your results only by: Marland Kitchen. MyChart Message (if you have MyChart) OR . A paper copy in the mail If you have any lab test that is abnormal or we need to change your treatment, we will call you to review the results.  Testing/Procedures: Your physician has recommended that you wear an 14 day event monitor. Event monitors are medical devices that record the heart's electrical activity. Doctors most often us these monitors to diagnose arrhythmias. Arrhythmias are problems with the speed or rhythm of the heartbeat. The monitor is a small, portable device. You can wear one while you do your normal daily activities. This is usually used to diagnose what is causing palpitations/syncope (passing out).   Follow-Up: At [facility], you and your health needs are our priority.  As part of our continuing mission to provide you with exceptional heart care, we have created designated Provider Care Teams.  These Care Teams include your primary Cardiologist (physician) and Advanced Practice Providers (APPs -  Physician Assistants and Nurse Practitioners) who all work together to provide you with the care you need, when you need it.  Your next appointment:   12 months  The format for your next appointment:   In Person  Provider:   Thurmon FairMihai Efren Kross, MD     Signed, Thurmon FairMihai Naziya Hegwood, MD  06/04/2019 10:00 PM    [facility]

## 2019-06-13 ENCOUNTER — Telehealth: Payer: Self-pay

## 2019-06-13 NOTE — Telephone Encounter (Signed)
14 day ZIO ordered and mailed to pt.  

## 2019-07-05 ENCOUNTER — Other Ambulatory Visit (INDEPENDENT_AMBULATORY_CARE_PROVIDER_SITE_OTHER): Payer: 59

## 2019-07-05 DIAGNOSIS — R002 Palpitations: Secondary | ICD-10-CM | POA: Diagnosis not present

## 2019-07-13 ENCOUNTER — Telehealth: Payer: 59 | Admitting: Family

## 2019-07-13 DIAGNOSIS — J019 Acute sinusitis, unspecified: Secondary | ICD-10-CM

## 2019-07-13 MED ORDER — DOXYCYCLINE HYCLATE 100 MG PO TABS: 100.0000 mg | ORAL_TABLET | Freq: Two times a day (BID) | ORAL | 0 refills | Status: DC

## 2019-07-13 NOTE — Progress Notes (Signed)
We are sorry that you are not feeling well.  Here is how we plan to help!  Based on what you have shared with me it looks like you have sinusitis.  Sinusitis is inflammation and infection in the sinus cavities of the head.  Based on your presentation I believe you most likely have Acute Bacterial Sinusitis.  This is an infection caused by bacteria and is treated with antibiotics. I have prescribed Doxycycline 100mg  by mouth twice a day for 10 days. You may use an oral decongestant such as Mucinex D or if you have glaucoma or high blood pressure use plain Mucinex. Saline nasal spray help and can safely be used as often as needed for congestion.  If you develop worsening sinus pain, fever or notice severe headache and vision changes, or if symptoms are not better after completion of antibiotic, please schedule an appointment with a health care provider.    You also need to be COVID tested to rule this out   Sinus infections are not as easily transmitted as other respiratory infection, however we still recommend that you avoid close contact with loved ones, especially the very young and elderly.  Remember to wash your hands thoroughly throughout the day as this is the number one way to prevent the spread of infection!  Home Care:  Only take medications as instructed by your medical team.  Complete the entire course of an antibiotic.  Do not take these medications with alcohol.  A steam or ultrasonic humidifier can help congestion.  You can place a towel over your head and breathe in the steam from hot water coming from a faucet.  Avoid close contacts especially the very young and the elderly.  Cover your mouth when you cough or sneeze.  Always remember to wash your hands.  Get Help Right Away If:  You develop worsening fever or sinus pain.  You develop a severe head ache or visual changes.  Your symptoms persist after you have completed your treatment plan.  Make sure you  Understand  these instructions.  Will watch your condition.  Will get help right away if you are not doing well or get worse.  Your e-visit answers were reviewed by a board certified advanced clinical practitioner to complete your personal care plan.  Depending on the condition, your plan could have included both over the counter or prescription medications.  If there is a problem please reply  once you have received a response from your provider.  Your safety is important to [place].  If you have drug allergies check your prescription carefully.    You can use MyChart to ask questions about today's visit, request a non-urgent call back, or ask for a work or school excuse for 24 hours related to this e-Visit. If it has been greater than 24 hours you will need to follow up with your provider, or enter a new e-Visit to address those concerns.  You will get an e-mail in the next two days asking about your experience.  I hope that your e-visit has been valuable and will speed your recovery. Thank you for using e-visits.   Approximately 5 minutes was spent documenting and reviewing patient's chart.

## 2019-07-15 ENCOUNTER — Ambulatory Visit: Payer: 59 | Attending: Internal Medicine

## 2019-07-15 DIAGNOSIS — Z20822 Contact with and (suspected) exposure to covid-19: Secondary | ICD-10-CM

## 2019-07-16 LAB — NOVEL CORONAVIRUS, NAA: SARS-CoV-2, NAA: NOT DETECTED

## 2019-07-28 ENCOUNTER — Ambulatory Visit: Payer: 59 | Attending: Internal Medicine

## 2019-07-28 DIAGNOSIS — Z20822 Contact with and (suspected) exposure to covid-19: Secondary | ICD-10-CM

## 2019-07-30 ENCOUNTER — Telehealth: Payer: Self-pay | Admitting: Obstetrics & Gynecology

## 2019-07-30 ENCOUNTER — Encounter: Payer: Self-pay | Admitting: Obstetrics & Gynecology

## 2019-07-30 LAB — NOVEL CORONAVIRUS, NAA

## 2019-07-30 NOTE — Telephone Encounter (Signed)
Spoke with patient. Confirmed patient received the Moderna SARS-Covid19 vaccine on 07/29/19 through her employer.   Immunizations updated in Epic.   Routing to provider for final review. Patient is agreeable to disposition. Will close encounter.

## 2019-07-30 NOTE — Telephone Encounter (Signed)
Patient sent the following correspondence through MyChart.  Just wanted to let you know that I had my 1st Covid 19 vaccine yesterday 07-29-2019 for your records.

## 2019-08-04 ENCOUNTER — Telehealth: Payer: Self-pay | Admitting: *Deleted

## 2019-08-04 ENCOUNTER — Other Ambulatory Visit: Payer: Self-pay | Admitting: *Deleted

## 2019-08-04 MED ORDER — TRAMADOL HCL 50 MG PO TABS: 50.0000 mg | ORAL_TABLET | Freq: Three times a day (TID) | ORAL | 0 refills | Status: DC | PRN

## 2019-08-04 NOTE — Telephone Encounter (Signed)
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 Only need to be treated to the degree that they bother her. Not easily amenable to ablation. May respond better to diltiazem than beta blocker. We could try to switch if she wishes.

## 2019-08-04 NOTE — Telephone Encounter (Signed)
The patient stated she was interested in trying the diltiazem but was concerned that her blood pressure runs too low at times. She stated that it typically can in the 90's systolic. She is going to look back at her blood pressure logs and then take it tonight to let us know what the readings were. She stated that she is sometimes symptomatic feeling like she might pas out but this does not happen often.

## 2019-08-04 NOTE — Telephone Encounter (Signed)
Received refill request via fax  Last Visit: 03/21/19 Next Visit: 09/22/19 UDS:03/21/19 Narc Agreement: 03/21/19  Okay to refill Tramadol?

## 2019-08-05 NOTE — Telephone Encounter (Signed)
See MyChart message for details.  

## 2019-08-26 ENCOUNTER — Encounter: Payer: Self-pay | Admitting: Obstetrics & Gynecology

## 2019-08-27 ENCOUNTER — Telehealth: Payer: Self-pay | Admitting: Obstetrics & Gynecology

## 2019-08-27 NOTE — Telephone Encounter (Signed)
Vaccination updated per patients request.  MyChart message to patient to notify.   Routing to provider for final review. Patient is agreeable to disposition. Will close encounter.

## 2019-08-27 NOTE — Telephone Encounter (Signed)
Patient sent the following correspondence through MyChart.  Good evening. Just FYI for your records , I had my 2nd Covid vaccine today.  Hope all is well for you and your family and staff.  Corrie Dandy

## 2019-09-16 NOTE — Progress Notes (Signed)
Office Visit Note  Patient: Brianna [name]             Date of Birth: 02-24-1966           MRN: [id number]             PCP: Glenis Smoker, MD Referring: Glenis Smoker, * Visit Date: 09/22/2019 Occupation: '@GUAROCC'$ @  Subjective:  Plantar fasciitis of right foot   History of Present Illness: Brianna [name] is a 54 y.o. female with history of myofascial pain syndrome and osteoarthritis.  Patient reports she has been experiencing increased myofascial pain this past winter.  She is in significant discomfort today.  She has generalized muscle aches and muscle tenderness.  She is having trapezius muscle spasms muscle tension bilaterally.  She continues to have chronic lower back pain but denies any symptoms of radiculopathy.  She has ongoing trochanter bursitis bilaterally.  She perform stretching exercises daily.  She has started having symptoms of plantar fasciitis in the right foot.  She has tried some stretching exercises and has been using a tennis ball to role out for foot.  She continues to have interrupted sleep at night due to discomfort she experiences.  Her level of fatigue has worsened recently.  She continues to take tramadol 50 mg 3 times daily as needed and Robaxin 500 mg twice daily as needed for pain relief.  She has been taking tramadol and Robaxin sparingly for pain relief   Activities of Daily Living:  Patient reports morning stiffness for several hours.   Patient Reports nocturnal pain.  Difficulty dressing/grooming: Denies Difficulty climbing stairs: Denies Difficulty getting out of chair: Denies Difficulty using hands for taps, buttons, cutlery, and/or writing: Denies  Review of Systems  Constitutional: Positive for fatigue.  HENT: Negative for mouth sores, mouth dryness and nose dryness.   Eyes: Negative for pain, itching, visual disturbance and dryness.  Respiratory: Negative for cough, hemoptysis, shortness of breath and difficulty breathing.     Cardiovascular: Negative for chest pain, palpitations, hypertension and swelling in legs/feet.  Gastrointestinal: Negative for blood in stool, constipation and diarrhea.  Endocrine: Negative for increased urination.  Genitourinary: Negative for difficulty urinating and painful urination.  Musculoskeletal: Positive for arthralgias, joint pain and morning stiffness. Negative for joint swelling, myalgias, muscle weakness, muscle tenderness and myalgias.  Skin: Negative for color change, pallor, rash, hair loss, nodules/bumps, skin tightness, ulcers and sensitivity to sunlight.  Allergic/Immunologic: Negative for susceptible to infections.  Neurological: Positive for headaches. Negative for dizziness, numbness and memory loss.  Hematological: Negative for swollen glands.  Psychiatric/Behavioral: Positive for sleep disturbance. Negative for depressed mood and confusion. The patient is not nervous/anxious.     PMFS History:  Patient Active Problem List   Diagnosis Date Noted  . Deviated septum 04/08/2019  . Chronic nasal congestion 04/08/2019  . Hypertrophy of both inferior nasal turbinates 04/08/2019  . Myofascial pain syndrome 08/17/2016  . HLA B27 (HLA B27 positive) 08/17/2016  . Trochanteric bursitis of both hips 08/17/2016  . History of migraine 08/17/2016  . Primary osteoarthritis of both hands 08/17/2016  . Primary osteoarthritis of both feet 08/17/2016  . Plantar fasciitis 08/17/2016  . Hyperparathyroidism ([place]) 09/20/2015  . 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 Bursitis of hip    bilateral  . Deviated nasal septum   . Factor V Leiden ([place])   . Fibromyalgia 2013   Dr. Estanislado Pandy  . HPV test positive  9/13   neg Pap, +16/18 HPV  . Mitral valve prolapse   . Molluscum contagiosum   . Obesity   . Plantar fasciitis   . Tendonitis of elbow, right     Family History  Problem Relation Age of Onset  . Colon cancer Sister 56        pre-colon cancer-had surgery  . Heart disease Sister   . Graves' disease Sister   . Hashimoto's thyroiditis Sister   . Diabetes Father   . Hypertension Father   . Hyperlipidemia Father   . Parkinson's disease Father   . COPD Mother   . Rheum arthritis Mother   . Hyperlipidemia Mother   . Heart attack Mother        01/2016  . Breast cancer Maternal Aunt   . Hyperparathyroidism Neg Hx    Past Surgical History:  Procedure Laterality Date  . CESAREAN SECTION  1997, 2000  . [place]  . WISDOM TOOTH EXTRACTION     Social History   Social History Narrative   Patient is right-handed. She lives with her husband in a 2 level home. She drinks tea 1-2 x a day. And an occasional soda. She walks most days for 30 minutes.   Immunization History  Administered Date(s) Administered  . Influenza,inj,Quad PF,6+ Mos 04/22/2019  . Moderna SARS-COVID-2 Vaccination 07/29/2019, 08/26/2019  . Tdap 07/30/2015  . Zoster Recombinat (Shingrix) 08/24/2018     Objective: Vital Signs: BP 106/68 (BP Location: Left Arm, Patient Position: Sitting, Cuff Size: Normal)   Pulse (!) 58   Resp 15   Ht '5\' 9"'$  (1.753 m)   Wt 222 lb 3.2 oz (100.8 kg)   LMP 12/22/2012   BMI 32.81 kg/m    Physical Exam Vitals and nursing note reviewed.  Constitutional:      Appearance: She is well-developed.  HENT:     Head: Normocephalic and atraumatic.  Eyes:     Conjunctiva/sclera: Conjunctivae normal.  Cardiovascular:     Rate and Rhythm: Normal rate and regular rhythm.     Heart sounds: Normal heart sounds.  Pulmonary:     Effort: Pulmonary effort is normal.     Breath sounds: Normal breath sounds.  Abdominal:     General: Bowel sounds are normal.     Palpations: Abdomen is soft.  Musculoskeletal:     Cervical back: Normal range of motion.  Lymphadenopathy:     Cervical: No cervical adenopathy.  Skin:    General: Skin is warm and dry.     Capillary Refill: Capillary refill takes  less than 2 seconds.  Neurological:     Mental Status: She is alert and oriented to person, place, and time.  Psychiatric:        Behavior: Behavior normal.      Musculoskeletal Exam: C-spine, thoracic spine, lumbar spine good range of motion.  No midline spinal tenderness.  No SI joint tenderness.  Shoulder joints, elbow joints, wrist joints, MCPs, PIPs, DIPs good range of motion no synovitis.  She has mild PIP and DIP synovial thickening consistent with osteoarthritis of both hands.  She has complete fist formation bilaterally.  Hip joints have good range of motion with no discomfort.  She has tenderness over bilateral trochanteric bursa.  Knee joints have good range of motion with no warmth or effusion.  Ankle joints have good range of motion with no tenderness or inflammation.  She has tenderness along the plantar aspect of the right foot.  No  Achilles tendinitis.  CDAI Exam: CDAI Score: -- Patient Global: --; Provider Global: -- Swollen: --; Tender: -- Joint Exam 09/22/2019   No joint exam has been documented for this visit   There is currently no information documented on the homunculus. Go to the Rheumatology activity and complete the homunculus joint exam.  Investigation: No additional findings.  Imaging: No results found.  Recent Labs: Lab Results  Component Value Date   WBC 5.8 05/16/2019   HGB 13.6 05/16/2019   PLT 275 05/16/2019   NA 143 05/16/2019   K 4.5 05/16/2019   CL 105 05/16/2019   CO2 24 05/16/2019   GLUCOSE 82 05/16/2019   BUN 14 05/16/2019   CREATININE 0.72 05/16/2019   BILITOT 0.6 05/16/2019   ALKPHOS 182 (H) 05/16/2019   AST 19 05/16/2019   ALT 16 05/16/2019   PROT 7.1 05/16/2019   ALBUMIN 4.6 05/16/2019   CALCIUM 9.6 05/16/2019   GFRAA 110 05/16/2019    Speciality Comments: No specialty comments available.  Procedures:  No procedures performed Allergies: Z-pak [azithromycin], Amoxicillin-pot clavulanate, and Sulfa antibiotics    Assessment / Plan:     Visit Diagnoses: Myofascial pain syndrome -She has generalized myofascial pain.  She is been experiencing increased muscle tension and muscle tenderness bilaterally for the past several months the cooler weather temperatures.  She presents today with trapezius muscle tension and muscle tenderness bilaterally.  She declined trigger point injections.  She continues to have ongoing trochanter bursitis bilaterally and was encouraged to perform stretching exercises daily.  She takes tramadol 50 mg 1 tablet 3 times daily as needed for pain relief and Robaxin 500 mg 1 tablet twice daily as needed for muscle spasms.  She finds both of these medications effective and only takes them sparingly.  She does not need any refills at this time.  We discussed the importance of regular exercise and good sleep hygiene.  She will follow-up in the office in 6 months.   Medication monitoring encounter - Tramadol 50 mg TID prn for pain relief.  UDS and narcotic agreement were updated today on 09/22/2019.- Plan: Pain Mgmt, Profile 5 w/Conf, U, Pain Mgmt, Tramadol w/medMATCH, U  Primary osteoarthritis of both hands: She has mild PIP and DIP synovial thickening consistent with osteoarthritis of both hands.  No synovitis noted.  She has complete fist formation bilaterally.  She was encouraged to perform hand exercises on a regular basis.  Joint protection and muscle strengthening were discussed.  Trochanteric bursitis of both hips: She has tenderness over bilateral trochanteric bursa.  She was encouraged to perform stretching exercises on a daily basis.  She experiences interrupted sleep at night due to nocturnal pain.  Primary osteoarthritis of both feet: She has mild PIP and DIP synovial thickening consistent with osteoarthritis of both feet.  No tenderness or synovitis was noted.  We discussed the importance of wearing proper fitting shoes.  Plantar fasciitis: Right foot-She has tenderness of the plantar  fasciitis of the right foot on exam.  She has been experiencing intermittent symptoms of plantar fasciitis of the right foot for the past 1 month.  We discussed the importance of performing stretching exercises.  She was given a handout of rehab exercises to perform.  We also discussed using the night boot which she has at home.  She is advised to notify us if her symptoms persist or worsen or she can return for cortisone injection or referral to physical therapy.  HLA B27 (HLA B27 positive)  Vitamin  D deficiency: She takes vitamin D 50,000 units 1 capsule by mouth every 14 days.  Other medical conditions are listed as follows:  History of migraine  Hyperparathyroidism ([place])  Orders: Orders Placed This Encounter  Procedures  . Pain Mgmt, Profile 5 w/Conf, U  . Pain Mgmt, Tramadol w/medMATCH, U   No orders of the defined types were placed in this encounter.     Follow-Up Instructions: Return in about 6 months (around 03/24/2020).   Ofilia Neas, PA-C  Note - This record has been created using Dragon software.  Chart creation errors have been sought, but may not always  have been located. Such creation errors do not reflect on  the standard of medical care.

## 2019-09-22 ENCOUNTER — Encounter: Payer: Self-pay | Admitting: Physician Assistant

## 2019-09-22 ENCOUNTER — Ambulatory Visit: Payer: 59 | Admitting: Physician Assistant

## 2019-09-22 ENCOUNTER — Other Ambulatory Visit: Payer: Self-pay

## 2019-09-22 VITALS — BP 106/68 | HR 58 | Resp 15 | Ht 69.0 in | Wt 222.2 lb

## 2019-09-22 DIAGNOSIS — M722 Plantar fascial fibromatosis: Secondary | ICD-10-CM

## 2019-09-22 DIAGNOSIS — E213 Hyperparathyroidism, unspecified: Secondary | ICD-10-CM

## 2019-09-22 DIAGNOSIS — Z5181 Encounter for therapeutic drug level monitoring: Secondary | ICD-10-CM | POA: Diagnosis not present

## 2019-09-22 DIAGNOSIS — M19072 Primary osteoarthritis, left ankle and foot: Secondary | ICD-10-CM

## 2019-09-22 DIAGNOSIS — M7062 Trochanteric bursitis, left hip: Secondary | ICD-10-CM

## 2019-09-22 DIAGNOSIS — M7918 Myalgia, other site: Secondary | ICD-10-CM

## 2019-09-22 DIAGNOSIS — Z8669 Personal history of other diseases of the nervous system and sense organs: Secondary | ICD-10-CM

## 2019-09-22 DIAGNOSIS — Z1589 Genetic susceptibility to other disease: Secondary | ICD-10-CM

## 2019-09-22 DIAGNOSIS — M7061 Trochanteric bursitis, right hip: Secondary | ICD-10-CM

## 2019-09-22 DIAGNOSIS — M19041 Primary osteoarthritis, right hand: Secondary | ICD-10-CM

## 2019-09-22 DIAGNOSIS — E559 Vitamin D deficiency, unspecified: Secondary | ICD-10-CM

## 2019-09-22 DIAGNOSIS — M19042 Primary osteoarthritis, left hand: Secondary | ICD-10-CM

## 2019-09-22 DIAGNOSIS — M19071 Primary osteoarthritis, right ankle and foot: Secondary | ICD-10-CM

## 2019-09-22 NOTE — Patient Instructions (Signed)
Plantar Fasciitis Rehab Ask your health care provider which exercises are safe for you. Do exercises exactly as told by your health care provider and adjust them as directed. It is normal to feel mild stretching, pulling, tightness, or discomfort as you do these exercises. Stop right away if you feel sudden pain or your pain gets worse. Do not begin these exercises until told by your health care provider. Stretching and range-of-motion exercises These exercises warm up your muscles and joints and improve the movement and flexibility of your foot. These exercises also help to relieve pain. Plantar fascia stretch  1. Sit with your left / right leg crossed over your opposite knee. 2. Hold your heel with one hand with that thumb near your arch. With your other hand, hold your toes and gently pull them back toward the top of your foot. You should feel a stretch on the bottom of your toes or your foot (plantar fascia) or both. 3. Hold this stretch for__________ seconds. 4. Slowly release your toes and return to the starting position. Repeat __________ times. Complete this exercise __________ times a day. Gastrocnemius stretch, standing This exercise is also called a calf (gastroc) stretch. It stretches the muscles in the back of the upper calf. 1. Stand with your hands against a wall. 2. Extend your left / right leg behind you, and bend your front knee slightly. 3. Keeping your heels on the floor and your back knee straight, shift your weight toward the wall. Do not arch your back. You should feel a gentle stretch in your upper left / right calf. 4. Hold this position for __________ seconds. Repeat __________ times. Complete this exercise __________ times a day. Soleus stretch, standing This exercise is also called a calf (soleus) stretch. It stretches the muscles in the back of the lower calf. 1. Stand with your hands against a wall. 2. Extend your left / right leg behind you, and bend your front  knee slightly. 3. Keeping your heels on the floor, bend your back knee and shift your weight slightly over your back leg. You should feel a gentle stretch deep in your lower calf. 4. Hold this position for __________ seconds. Repeat __________ times. Complete this exercise __________ times a day. Gastroc and soleus stretch, standing step This exercise stretches the muscles in the back of the lower leg. These muscles are in the upper calf (gastrocnemius) and the lower calf (soleus). 1. Stand with the ball of your left / right foot on a step. The ball of your foot is on the walking surface, right under your toes. 2. Keep your other foot firmly on the same step. 3. Hold on to the wall or a railing for balance. 4. Slowly lift your other foot, allowing your body weight to press your left / right heel down over the edge of the step. You should feel a stretch in your left / right calf. 5. Hold this position for __________ seconds. 6. Return both feet to the step. 7. Repeat this exercise with a slight bend in your left / right knee. Repeat __________ times with your left / right knee straight and __________ times with your left / right knee bent. Complete this exercise __________ times a day. Balance exercise This exercise builds your balance and strength control of your arch to help take pressure off your plantar fascia. Single leg stand If this exercise is too easy, you can try it with your eyes closed or while standing on a pillow. 1.   Without shoes, stand near a railing or in a doorway. You may hold on to the railing or door frame as needed. 2. Stand on your left / right foot. Keep your big toe down on the floor and try to keep your arch lifted. Do not let your foot roll inward. 3. Hold this position for __________ seconds. Repeat __________ times. Complete this exercise __________ times a day. This information is not intended to replace advice given to you by your health care provider. Make sure  you discuss any questions you have with your health care provider. Document Revised: 10/31/2018 Document Reviewed: 05/08/2018 Elsevier Patient Education  2020 [employer]  

## 2019-09-24 LAB — PAIN MGMT, PROFILE 5 W/CONF, U
Amphetamines: NEGATIVE ng/mL
Barbiturates: NEGATIVE ng/mL
Benzodiazepines: NEGATIVE ng/mL
Cocaine Metabolite: NEGATIVE ng/mL
Creatinine: 122.6 mg/dL
Marijuana Metabolite: NEGATIVE ng/mL
Methadone Metabolite: NEGATIVE ng/mL
Opiates: NEGATIVE ng/mL
Oxidant: NEGATIVE ug/mL
Oxycodone: NEGATIVE ng/mL
pH: 6.5 (ref 4.5–9.0)

## 2019-09-24 LAB — PAIN MGMT, TRAMADOL W/MEDMATCH, U
Desmethyltramadol: 1864 ng/mL
Tramadol: 1972 ng/mL

## 2019-09-24 NOTE — Progress Notes (Signed)
UDS is consistent with treatment.

## 2020-01-11 ENCOUNTER — Other Ambulatory Visit: Payer: Self-pay | Admitting: Rheumatology

## 2020-01-12 NOTE — Telephone Encounter (Signed)
Last Visit: 09/22/2019 Next Visit: 03/24/2020 UDS:09/22/2019 Narc Agreement: 09/22/2019  Last Fill: 08/04/2019  Okay to refill Tramadol?

## 2020-01-26 ENCOUNTER — Telehealth: Payer: 59 | Admitting: Physician Assistant

## 2020-01-26 DIAGNOSIS — J019 Acute sinusitis, unspecified: Secondary | ICD-10-CM

## 2020-01-26 MED ORDER — FLUCONAZOLE 150 MG PO TABS: 150.0000 mg | ORAL_TABLET | Freq: Once | ORAL | 0 refills | Status: AC

## 2020-01-26 MED ORDER — AZITHROMYCIN 250 MG PO TABS: ORAL_TABLET | ORAL | 0 refills | Status: AC

## 2020-01-26 MED ORDER — PSEUDOEPH-BROMPHEN-DM 30-2-10 MG/5ML PO SYRP: 5.0000 mL | ORAL_SOLUTION | Freq: Four times a day (QID) | ORAL | 0 refills | Status: AC | PRN

## 2020-01-26 MED ORDER — FLUTICASONE PROPIONATE 50 MCG/ACT NA SUSP: 2.0000 | Freq: Every day | NASAL | 6 refills | Status: DC

## 2020-01-26 NOTE — Addendum Note (Signed)
Addended by: Ronnie Doss A on: 01/26/2020 12:23 PM   Modules accepted: Orders

## 2020-01-26 NOTE — Progress Notes (Signed)
We are sorry that you are not feeling well.  Here is how we plan to help!  Based on what you have shared with me it looks like you have sinusitis.  Sinusitis is inflammation and infection in the sinus cavities of the head.  Based on your presentation I believe you most likely have Acute Viral Sinusitis.This is an infection most likely caused by a virus. There is not specific treatment for viral sinusitis other than to help you with the symptoms until the infection runs its course.  You may use an oral decongestant such as Mucinex D or if you have glaucoma or high blood pressure use plain Mucinex. Saline nasal spray help and can safely be used as often as needed for congestion, I have prescribed: Fluticasone nasal spray two sprays in each nostril once a day as well as prescrption bromfed liquid for 6 days  Some authorities believe that zinc sprays or the use of Echinacea may shorten the course of your symptoms.  Sinus infections are not as easily transmitted as other respiratory infection, however we still recommend that you avoid close contact with loved ones, especially the very young and elderly.  Remember to wash your hands thoroughly throughout the day as this is the number one way to prevent the spread of infection!  Home Care:  Only take medications as instructed by your medical team.  Do not take these medications with alcohol.  A steam or ultrasonic humidifier can help congestion.  You can place a towel over your head and breathe in the steam from hot water coming from a faucet.  Avoid close contacts especially the very young and the elderly.  Cover your mouth when you cough or sneeze.  Always remember to wash your hands.  Get Help Right Away If:  You develop worsening fever or sinus pain.  You develop a severe head ache or visual changes.  Your symptoms persist after you have completed your treatment plan.  Make sure you  Understand these instructions.  Will watch your  condition.  Will get help right away if you are not doing well or get worse.  Your e-visit answers were reviewed by a board certified advanced clinical practitioner to complete your personal care plan.  Depending on the condition, your plan could have included both over the counter or prescription medications.  If there is a problem please reply  once you have received a response from your provider.  Your safety is important to [place].  If you have drug allergies check your prescription carefully.    You can use MyChart to ask questions about today's visit, request a non-urgent call back, or ask for a work or school excuse for 24 hours related to this e-Visit. If it has been greater than 24 hours you will need to follow up with your provider, or enter a new e-Visit to address those concerns.  You will get an e-mail in the next two days asking about your experience.  I hope that your e-visit has been valuable and will speed your recovery. Thank you for using e-visits.   5 minutes spent on chart

## 2020-03-10 NOTE — Progress Notes (Deleted)
Office Visit Note  Patient: Brianna [name]             Date of Birth: 12-20-1965           MRN: [id number]             PCP: Glenis Smoker, MD Referring: Glenis Smoker, * Visit Date: 03/24/2020 Occupation: @GUAROCC @  Subjective:  No chief complaint on file.   History of Present Illness: Brianna [name] is a 54 y.o. female ***   Activities of Daily Living:  Patient reports morning stiffness for *** {minute/hour:19697}.   Patient {ACTIONS;DENIES/REPORTS:21021675::"Denies"} nocturnal pain.  Difficulty dressing/grooming: {ACTIONS;DENIES/REPORTS:21021675::"Denies"} Difficulty climbing stairs: {ACTIONS;DENIES/REPORTS:21021675::"Denies"} Difficulty getting out of chair: {ACTIONS;DENIES/REPORTS:21021675::"Denies"} Difficulty using hands for taps, buttons, cutlery, and/or writing: {ACTIONS;DENIES/REPORTS:21021675::"Denies"}  No Rheumatology ROS completed.   PMFS History:  Patient Active Problem List   Diagnosis Date Noted  . Deviated septum 04/08/2019  . Chronic nasal congestion 04/08/2019  . Hypertrophy of both inferior nasal turbinates 04/08/2019  . Myofascial pain syndrome 08/17/2016  . HLA B27 (HLA B27 positive) 08/17/2016  . Trochanteric bursitis of both hips 08/17/2016  . History of migraine 08/17/2016  . Primary osteoarthritis of both hands 08/17/2016  . Primary osteoarthritis of both feet 08/17/2016  . Plantar fasciitis 08/17/2016  . Hyperparathyroidism ([place]) 09/20/2015  . 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 Bursitis of hip    bilateral  . Deviated nasal septum   . Factor V Leiden ([place])   . Fibromyalgia 2013   Dr. Estanislado Pandy  . 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 Mitral valve prolapse   . Molluscum contagiosum   . Obesity   . Plantar fasciitis   . Tendonitis of elbow, right     Family History  Problem Relation Age of Onset  . Colon cancer Sister 55       pre-colon cancer-had surgery  .  Heart disease Sister   . Graves' disease Sister   . Hashimoto's thyroiditis Sister   . Diabetes Father   . Hypertension Father   . Hyperlipidemia Father   . Parkinson's disease Father   . COPD Mother   . Rheum arthritis Mother   . Hyperlipidemia Mother   . Heart attack Mother        01/2016  . Breast cancer Maternal Aunt   . Hyperparathyroidism Neg Hx    Past Surgical History:  Procedure Laterality Date  . CESAREAN SECTION  1997, 2000  . [place]  . WISDOM TOOTH EXTRACTION     Social History   Social History Narrative   Patient is right-handed. She lives with her husband in a 2 level home. She drinks tea 1-2 x a day. And an occasional soda. She walks most days for 30 minutes.   Immunization History  Administered Date(s) Administered  . Influenza,inj,Quad PF,6+ Mos 04/22/2019  . Moderna SARS-COVID-2 Vaccination 07/29/2019, 08/26/2019  . Tdap 07/30/2015  . Zoster Recombinat (Shingrix) 08/24/2018     Objective: Vital Signs: LMP 12/22/2012    Physical Exam   Musculoskeletal Exam: ***  CDAI Exam: CDAI Score: -- Patient Global: --; Provider Global: -- Swollen: --; Tender: -- Joint Exam 03/24/2020   No joint exam has been documented for this visit   There is currently no information documented on the homunculus. Go to the Rheumatology activity and complete the homunculus joint exam.  Investigation: No additional findings.  Imaging: No results  found.  Recent Labs: Lab Results  Component Value Date   WBC 5.8 05/16/2019   HGB 13.6 05/16/2019   PLT 275 05/16/2019   NA 143 05/16/2019   K 4.5 05/16/2019   CL 105 05/16/2019   CO2 24 05/16/2019   GLUCOSE 82 05/16/2019   BUN 14 05/16/2019   CREATININE 0.72 05/16/2019   BILITOT 0.6 05/16/2019   ALKPHOS 182 (H) 05/16/2019   AST 19 05/16/2019   ALT 16 05/16/2019   PROT 7.1 05/16/2019   ALBUMIN 4.6 05/16/2019   CALCIUM 9.6 05/16/2019   GFRAA 110 05/16/2019    Speciality Comments:  No specialty comments available.  Procedures:  No procedures performed Allergies: Z-pak [azithromycin], Amoxicillin-pot clavulanate, and Sulfa antibiotics   Assessment / Plan:     Visit Diagnoses: Myofascial pain syndrome  Primary osteoarthritis of both hands  Trochanteric bursitis of both hips  Primary osteoarthritis of both feet  Plantar fasciitis  Vitamin D deficiency  HLA B27 (HLA B27 positive)  History of migraine  Hyperparathyroidism ([place])  Orders: No orders of the defined types were placed in this encounter.  No orders of the defined types were placed in this encounter.   Face-to-face time spent with patient was *** minutes. Greater than 50% of time was spent in counseling and coordination of care.  Follow-Up Instructions: No follow-ups on file.   Ofilia Neas, PA-C  Note - This record has been created using Dragon software.  Chart creation errors have been sought, but may not always  have been located. Such creation errors do not reflect on  the standard of medical care.

## 2020-03-22 ENCOUNTER — Other Ambulatory Visit: Payer: Self-pay | Admitting: Physician Assistant

## 2020-03-22 ENCOUNTER — Other Ambulatory Visit: Payer: Self-pay | Admitting: Rheumatology

## 2020-03-22 NOTE — Telephone Encounter (Signed)
Last Visit: 09/22/2019 Next Visit: 03/24/2020 UDS:09/22/2019 Narc Agreement: 09/22/2019  Last Fill: 01/12/2020   Okay to refill Tramadol?

## 2020-03-22 NOTE — Telephone Encounter (Signed)
Last Visit: 09/22/2019 Next Visit: 03/24/2020  Okay to refill Methocarbamol?

## 2020-03-24 ENCOUNTER — Ambulatory Visit: Payer: 59 | Admitting: Physician Assistant

## 2020-03-24 DIAGNOSIS — M722 Plantar fascial fibromatosis: Secondary | ICD-10-CM

## 2020-03-24 DIAGNOSIS — M19041 Primary osteoarthritis, right hand: Secondary | ICD-10-CM

## 2020-03-24 DIAGNOSIS — Z1589 Genetic susceptibility to other disease: Secondary | ICD-10-CM

## 2020-03-24 DIAGNOSIS — E559 Vitamin D deficiency, unspecified: Secondary | ICD-10-CM

## 2020-03-24 DIAGNOSIS — Z8669 Personal history of other diseases of the nervous system and sense organs: Secondary | ICD-10-CM

## 2020-03-24 DIAGNOSIS — E213 Hyperparathyroidism, unspecified: Secondary | ICD-10-CM

## 2020-03-24 DIAGNOSIS — M19071 Primary osteoarthritis, right ankle and foot: Secondary | ICD-10-CM

## 2020-03-24 DIAGNOSIS — M7061 Trochanteric bursitis, right hip: Secondary | ICD-10-CM

## 2020-03-24 DIAGNOSIS — M7918 Myalgia, other site: Secondary | ICD-10-CM

## 2020-04-02 ENCOUNTER — Other Ambulatory Visit: Payer: Self-pay | Admitting: Family Medicine

## 2020-04-02 DIAGNOSIS — Z1231 Encounter for screening mammogram for malignant neoplasm of breast: Secondary | ICD-10-CM

## 2020-04-02 NOTE — Progress Notes (Deleted)
Office Visit Note  Patient: Brianna [name]             Date of Birth: 1965-08-05           MRN: [id number]             PCP: Shon Hale, MD Referring: Shon Hale, * Visit Date: 04/14/2020 Occupation: @GUAROCC @  Subjective:  No chief complaint on file.   History of Present Illness: Brianna [name] is a 54 y.o. female ***   Activities of Daily Living:  Patient reports morning stiffness for *** {minute/hour:19697}.   Patient {ACTIONS;DENIES/REPORTS:21021675::"Denies"} nocturnal pain.  Difficulty dressing/grooming: {ACTIONS;DENIES/REPORTS:21021675::"Denies"} Difficulty climbing stairs: {ACTIONS;DENIES/REPORTS:21021675::"Denies"} Difficulty getting out of chair: {ACTIONS;DENIES/REPORTS:21021675::"Denies"} Difficulty using hands for taps, buttons, cutlery, and/or writing: {ACTIONS;DENIES/REPORTS:21021675::"Denies"}  No Rheumatology ROS completed.   PMFS History:  Patient Active Problem List   Diagnosis Date Noted  . Deviated septum 04/08/2019  . Chronic nasal congestion 04/08/2019  . Hypertrophy of both inferior nasal turbinates 04/08/2019  . Myofascial pain syndrome 08/17/2016  . HLA B27 (HLA B27 positive) 08/17/2016  . Trochanteric bursitis of both hips 08/17/2016  . History of migraine 08/17/2016  . Primary osteoarthritis of both hands 08/17/2016  . Primary osteoarthritis of both feet 08/17/2016  . Plantar fasciitis 08/17/2016  . Hyperparathyroidism (HCC) 09/20/2015  . 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 Bursitis of hip    bilateral  . Deviated nasal septum   . Factor V Leiden (HCC)   . Fibromyalgia 2013   Dr. 2014  . 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 Mitral valve prolapse   . Molluscum contagiosum   . Obesity   . Plantar fasciitis   . Tendonitis of elbow, right     Family History  Problem Relation Age of Onset  . Colon cancer Sister 47       pre-colon cancer-had surgery  .  Heart disease Sister   . Graves' disease Sister   . Hashimoto's thyroiditis Sister   . Diabetes Father   . Hypertension Father   . Hyperlipidemia Father   . Parkinson's disease Father   . COPD Mother   . Rheum arthritis Mother   . Hyperlipidemia Mother   . Heart attack Mother        01/2016  . Breast cancer Maternal Aunt   . Hyperparathyroidism Neg Hx    Past Surgical History:  Procedure Laterality Date  . CESAREAN SECTION  1997, 2000  . TEMPOROMANDIBULAR JOINT SURGERY  1988  . WISDOM TOOTH EXTRACTION     Social History   Social History Narrative   Patient is right-handed. She lives with her husband in a 2 level home. She drinks tea 1-2 x a day. And an occasional soda. She walks most days for 30 minutes.   Immunization History  Administered Date(s) Administered  . Influenza,inj,Quad PF,6+ Mos 04/22/2019  . Moderna SARS-COVID-2 Vaccination 07/29/2019, 08/26/2019  . Tdap 07/30/2015  . Zoster Recombinat (Shingrix) 08/24/2018     Objective: Vital Signs: LMP 12/22/2012    Physical Exam   Musculoskeletal Exam: ***  CDAI Exam: CDAI Score: -- Patient Global: --; Provider Global: -- Swollen: --; Tender: -- Joint Exam 04/14/2020   No joint exam has been documented for this visit   There is currently no information documented on the homunculus. Go to the Rheumatology activity and complete the homunculus joint exam.  Investigation: No additional findings.  Imaging: No results  found.  Recent Labs: Lab Results  Component Value Date   WBC 5.8 05/16/2019   HGB 13.6 05/16/2019   PLT 275 05/16/2019   NA 143 05/16/2019   K 4.5 05/16/2019   CL 105 05/16/2019   CO2 24 05/16/2019   GLUCOSE 82 05/16/2019   BUN 14 05/16/2019   CREATININE 0.72 05/16/2019   BILITOT 0.6 05/16/2019   ALKPHOS 182 (H) 05/16/2019   AST 19 05/16/2019   ALT 16 05/16/2019   PROT 7.1 05/16/2019   ALBUMIN 4.6 05/16/2019   CALCIUM 9.6 05/16/2019   GFRAA 110 05/16/2019    Speciality Comments:  No specialty comments available.  Procedures:  No procedures performed Allergies: Z-pak [azithromycin], Amoxicillin-pot clavulanate, and Sulfa antibiotics   Assessment / Plan:     Visit Diagnoses: Myofascial pain syndrome  Medication monitoring encounter - Robaxin and tramadol  Primary osteoarthritis of both hands  Trochanteric bursitis of both hips  Primary osteoarthritis of both feet  Plantar fasciitis  HLA B27 (HLA B27 positive)  Vitamin D deficiency  History of migraine  Hyperparathyroidism ([place])  Orders: No orders of the defined types were placed in this encounter.  No orders of the defined types were placed in this encounter.   Face-to-face time spent with patient was *** minutes. Greater than 50% of time was spent in counseling and coordination of care.  Follow-Up Instructions: No follow-ups on file.   Ofilia Neas, PA-C  Note - This record has been created using Dragon software.  Chart creation errors have been sought, but may not always  have been located. Such creation errors do not reflect on  the standard of medical care.

## 2020-04-06 NOTE — Telephone Encounter (Signed)
Please schedule a sooner office visit for further evaluation and to discuss recommendations.

## 2020-04-07 NOTE — Progress Notes (Signed)
Office Visit Note  Patient: Brianna [name]             Date of Birth: 28-Jan-1966           MRN: [id number]             PCP: Glenis Smoker, MD Referring: Glenis Smoker, * Visit Date: 04/08/2020 Occupation: @GUAROCC @  Subjective:  Generalized pain   History of Present Illness: Brianna [name] is a 54 y.o. female with history of myofascial pain and osteoarthritis.  Patient reports that she has been having more frequent and severe myofascial pain flares.  She has had increased generalized myalgias and muscle tenderness.  She is also been experiencing frequent muscle spasms.  She presents today with trochanter bursitis of both hips.  She is also having trapezius muscle tension and muscle tenderness bilaterally.  She takes Robaxin 500 mg 1 tablet daily as needed for muscle spasms.  She continues to take tramadol as needed for pain relief.  She has been taking tramadol very sparingly.  She has also been taking turmeric, tart cherry, coenzyme Q10, and magnesium as recommended.  She has interrupted sleep at night often times due to nocturnal pain.  Her fatigue has been worsening secondary to insomnia.  She has been walking on a regular basis for exercise.  She has also noticed some increased discomfort in both hands but denies any joint swelling.    Activities of Daily Living:  Patient reports morning stiffness for 30 minutes-2 hours.   Patient Reports nocturnal pain.  Difficulty dressing/grooming: Denies Difficulty climbing stairs: Denies Difficulty getting out of chair: Denies Difficulty using hands for taps, buttons, cutlery, and/or writing: Reports  Review of Systems  Constitutional: Positive for fatigue.  HENT: Negative for mouth sores, mouth dryness and nose dryness.   Eyes: Negative for pain, redness, visual disturbance and dryness.  Respiratory: Negative for shortness of breath and difficulty breathing.   Cardiovascular: Positive for palpitations and swelling in  legs/feet. Negative for chest pain.  Gastrointestinal: Negative for blood in stool, constipation and diarrhea.  Endocrine: Negative for increased urination.  Genitourinary: Negative for difficulty urinating and painful urination.  Musculoskeletal: Positive for arthralgias, joint pain, joint swelling, myalgias, morning stiffness, muscle tenderness and myalgias. Negative for muscle weakness.  Skin: Negative for color change, rash and redness.  Allergic/Immunologic: Negative for susceptible to infections.  Neurological: Positive for numbness and headaches. Negative for dizziness and memory loss.  Hematological: Positive for bruising/bleeding tendency.  Psychiatric/Behavioral: Positive for sleep disturbance. Negative for confusion.    PMFS History:  Patient Active Problem List   Diagnosis Date Noted  . Deviated septum 04/08/2019  . Chronic nasal congestion 04/08/2019  . Hypertrophy of both inferior nasal turbinates 04/08/2019  . Myofascial pain syndrome 08/17/2016  . HLA B27 (HLA B27 positive) 08/17/2016  . Trochanteric bursitis of both hips 08/17/2016  . History of migraine 08/17/2016  . Primary osteoarthritis of both hands 08/17/2016  . Primary osteoarthritis of both feet 08/17/2016  . Plantar fasciitis 08/17/2016  . Hyperparathyroidism ([place]) 09/20/2015  . 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 Bursitis of hip    bilateral  . Deviated nasal septum   . Factor V Leiden ([place])   . Fibromyalgia 2013   Dr. Estanislado Pandy  . 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 Mitral valve prolapse   . Molluscum contagiosum   . Obesity   . Plantar fasciitis   .  Tendonitis of elbow, right     Family History  Problem Relation Age of Onset  . Colon cancer Sister 34       pre-colon cancer-had surgery  . Heart disease Sister   . Graves' disease Sister   . Hashimoto's thyroiditis Sister   . Diabetes Father   . Hypertension Father   . Hyperlipidemia  Father   . Parkinson's disease Father   . COPD Mother   . Rheum arthritis Mother   . Hyperlipidemia Mother   . Heart attack Mother        01/2016  . Breast cancer Maternal Aunt   . Hyperparathyroidism Neg Hx    Past Surgical History:  Procedure Laterality Date  . CESAREAN SECTION  1997, 2000  . [place]  . WISDOM TOOTH EXTRACTION     Social History   Social History Narrative   Patient is right-handed. She lives with her husband in a 2 level home. She drinks tea 1-2 x a day. And an occasional soda. She walks most days for 30 minutes.   Immunization History  Administered Date(s) Administered  . Influenza,inj,Quad PF,6+ Mos 04/22/2019  . Moderna SARS-COVID-2 Vaccination 07/29/2019, 08/26/2019  . Tdap 07/30/2015  . Zoster Recombinat (Shingrix) 08/24/2018     Objective: Vital Signs: BP 120/79 (BP Location: Left Arm, Patient Position: Sitting, Cuff Size: Small)   Pulse 66   Resp 14   Ht $R'5\' 9"'Fl$  (1.753 m)   Wt 216 lb 9.6 oz (98.2 kg)   LMP 12/22/2012   BMI 31.99 kg/m    Physical Exam Vitals and nursing note reviewed.  Constitutional:      Appearance: She is well-developed.  HENT:     Head: Normocephalic and atraumatic.  Eyes:     Conjunctiva/sclera: Conjunctivae normal.  Pulmonary:     Effort: Pulmonary effort is normal.  Abdominal:     Palpations: Abdomen is soft.  Musculoskeletal:     Cervical back: Normal range of motion.  Skin:    General: Skin is warm and dry.     Capillary Refill: Capillary refill takes less than 2 seconds.  Neurological:     Mental Status: She is alert and oriented to person, place, and time.  Psychiatric:        Behavior: Behavior normal.      Musculoskeletal Exam: Generalized hyperalgesia and positive tender points on exam.  C-spine, thoracic spine, lumbar spine have good range of motion.  No midline spinal tenderness.  No SI joint tenderness.  She has trapezius muscle tension and muscle tenderness  bilaterally.  Shoulder joints, elbow joints, wrist joints, MCPs, PIPs, DIPs have good range of motion with no synovitis.  She is able to make a complete fist bilaterally.  Hip joints have good range of motion with no discomfort.  Tenderness over bilateral trochanteric bursa.  Knee joints have good range of motion with no warmth or effusion.  Ankle joints have good range of motion no tenderness or inflammation.  CDAI Exam: CDAI Score: -- Patient Global: --; Provider Global: -- Swollen: --; Tender: -- Joint Exam 04/08/2020   No joint exam has been documented for this visit   There is currently no information documented on the homunculus. Go to the Rheumatology activity and complete the homunculus joint exam.  Investigation: No additional findings.  Imaging: No results found.  Recent Labs: Lab Results  Component Value Date   WBC 5.8 05/16/2019   HGB 13.6 05/16/2019   PLT 275 05/16/2019  NA 143 05/16/2019   K 4.5 05/16/2019   CL 105 05/16/2019   CO2 24 05/16/2019   GLUCOSE 82 05/16/2019   BUN 14 05/16/2019   CREATININE 0.72 05/16/2019   BILITOT 0.6 05/16/2019   ALKPHOS 182 (H) 05/16/2019   AST 19 05/16/2019   ALT 16 05/16/2019   PROT 7.1 05/16/2019   ALBUMIN 4.6 05/16/2019   CALCIUM 9.6 05/16/2019   GFRAA 110 05/16/2019    Speciality Comments: No specialty comments available.  Procedures:  No procedures performed Allergies: Z-pak [azithromycin], Amoxicillin-pot clavulanate, and Sulfa antibiotics   Assessment / Plan:     Visit Diagnoses: Myofascial pain syndrome -She has generalized hyperalgesia and positive tender points on exam.  She has been having more frequent and severe flares recently.  She has been under tremendous amount of stress which she feels has exacerbated her generalized pain.  She has been having more frequent muscle spasms as well as increased muscle tenderness.  She has trapezius muscle tension and muscle tenderness today.  She also is experiencing  trochanter bursitis of both hips.  She has tenderness palpation on examination today.  She has been taking Robaxin 500 mg 1 tablet daily as needed for muscle spasms and tramadol 50 mg 1 tablet daily as needed for pain relief.  She finds both medications to be effective at managing her symptoms.  She is also taking turmeric, tart cherry, coenzyme every 10, and magnesium as recommended.  We discussed adding ginger and omega-3.  She was given the full list of natural anti-inflammatories today in the office.  We discussed the importance of regular exercise and good sleep hygiene.  She has been walking for exercise on a regular basis.  A referral to integrative therapies will be placed today.  She was advised to notify us if she continues to have persistent and recurrent flares.  She will follow-up in the office in 6 months.  Plan: Ambulatory referral to Physical Therapy  Medication monitoring encounter - tramadol 50 mg 1 tablet daily as needed. UDS & narcotic agreement updated today on 04/08/2020.- Plan: DRUG MONITOR, TRAMADOL,QN, URINE, DRUG MONITOR, PANEL 5, W/CONF, URINE  Primary osteoarthritis of both hands: She has been experiencing increased pain in both hands.  She has no tenderness or synovitis on examination today.  She is able to make a complete fist bilaterally.  Her discomfort is likely due to underlying osteoarthritis.  She has been taking turmeric and tart cherry and was encouraged to start taking ginger and omega-3 as well.  We discussed the importance of joint protection and muscle strengthening.  She was advised to notify us if she develops increased joint pain or joint swelling.  Trochanteric bursitis of both hips -She experiences intermittent trochanteric bursitis.  She has tenderness palpation on examination today.  She has difficulty laying on her sides at night due to the discomfort.  We discussed the importance of performing stretching exercises on a daily basis.  She declined a cortisone  injection today.  She will be referred to integrative therapies.  Plan: Ambulatory referral to Physical Therapy  Primary osteoarthritis of both feet: She is not experiencing any discomfort in her feet at this time.  She wears proper fitting shoes.  Plantar fasciitis: Resolved.  HLA B27 (HLA B27 positive)  Vitamin D deficiency: Vitamin D was 37.1 on 05/16/2019.  She is taking vitamin D 50,000 units once every 14 days.  Other medical conditions are listed as follows:  Hyperparathyroidism ([place])  History of migraine  Orders:  Orders Placed This Encounter  Procedures  . DRUG MONITOR, TRAMADOL,QN, URINE  . DRUG MONITOR, PANEL 5, W/CONF, URINE  . Ambulatory referral to Physical Therapy   No orders of the defined types were placed in this encounter.   Follow-Up Instructions: Return in about 6 months (around 10/06/2020) for Myofascial pain, Osteoarthritis.   Ofilia Neas, PA-C  Note - This record has been created using Dragon software.  Chart creation errors have been sought, but may not always  have been located. Such creation errors do not reflect on  the standard of medical care.

## 2020-04-08 ENCOUNTER — Ambulatory Visit: Payer: 59 | Admitting: Physician Assistant

## 2020-04-08 ENCOUNTER — Encounter: Payer: Self-pay | Admitting: Physician Assistant

## 2020-04-08 ENCOUNTER — Other Ambulatory Visit: Payer: Self-pay

## 2020-04-08 DIAGNOSIS — E213 Hyperparathyroidism, unspecified: Secondary | ICD-10-CM

## 2020-04-08 DIAGNOSIS — M19041 Primary osteoarthritis, right hand: Secondary | ICD-10-CM

## 2020-04-08 DIAGNOSIS — M19071 Primary osteoarthritis, right ankle and foot: Secondary | ICD-10-CM

## 2020-04-08 DIAGNOSIS — M7918 Myalgia, other site: Secondary | ICD-10-CM

## 2020-04-08 DIAGNOSIS — M19042 Primary osteoarthritis, left hand: Secondary | ICD-10-CM

## 2020-04-08 DIAGNOSIS — M7061 Trochanteric bursitis, right hip: Secondary | ICD-10-CM | POA: Diagnosis not present

## 2020-04-08 DIAGNOSIS — M19072 Primary osteoarthritis, left ankle and foot: Secondary | ICD-10-CM

## 2020-04-08 DIAGNOSIS — Z1589 Genetic susceptibility to other disease: Secondary | ICD-10-CM

## 2020-04-08 DIAGNOSIS — Z5181 Encounter for therapeutic drug level monitoring: Secondary | ICD-10-CM

## 2020-04-08 DIAGNOSIS — Z8669 Personal history of other diseases of the nervous system and sense organs: Secondary | ICD-10-CM

## 2020-04-08 DIAGNOSIS — M722 Plantar fascial fibromatosis: Secondary | ICD-10-CM

## 2020-04-08 DIAGNOSIS — E559 Vitamin D deficiency, unspecified: Secondary | ICD-10-CM

## 2020-04-08 DIAGNOSIS — M7062 Trochanteric bursitis, left hip: Secondary | ICD-10-CM

## 2020-04-10 LAB — DRUG MONITOR, PANEL 5, W/CONF, URINE
Amphetamines: NEGATIVE ng/mL (ref <500)
Barbiturates: NEGATIVE ng/mL (ref <300)
Benzodiazepines: NEGATIVE ng/mL (ref <100)
Cocaine Metabolite: NEGATIVE ng/mL (ref <150)
Creatinine: 83.8 mg/dL
Marijuana Metabolite: NEGATIVE ng/mL (ref <20)
Methadone Metabolite: NEGATIVE ng/mL (ref <100)
Opiates: NEGATIVE ng/mL (ref <100)
Oxidant: NEGATIVE ug/mL
Oxycodone: NEGATIVE ng/mL (ref <100)
pH: 7.1 (ref 4.5–9.0)

## 2020-04-10 LAB — DRUG MONITOR, TRAMADOL,QN, URINE
Desmethyltramadol: NEGATIVE ng/mL (ref <100)
Tramadol: NEGATIVE ng/mL (ref <100)

## 2020-04-10 LAB — DM TEMPLATE

## 2020-04-12 NOTE — Progress Notes (Signed)
UDS is negative for tramadol.  She takes tramadol very sparingly for moderate to severe pain relief.

## 2020-04-14 ENCOUNTER — Ambulatory Visit: Payer: 59 | Admitting: Physician Assistant

## 2020-05-11 ENCOUNTER — Other Ambulatory Visit: Payer: Self-pay | Admitting: Physician Assistant

## 2020-05-11 NOTE — Progress Notes (Signed)
54 y.o. [id number] Married White or Caucasian female here for annual exam.  Oldest has just finished his master's degree.  He has a new job.  Youngest is finishing undergrad degree.   Denies vaginal bleeding.    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  The company has hired someone but this was really stressful.  She did work from home 3 days a week for a while.      Patient's last menstrual period was 12/22/2012.          Sexually active: Yes.    The current method of family planning is post menopausal status.    Exercising: Yes.    walking Smoker:  no  Health Maintenance: Pap:  11-16-16 neg, 05-16-2019 neg History of abnormal Pap:  yes MMG:  05-23-2019 category a density birads 1:neg, scheduled for 05-24-2020 Colonoscopy:  09-24-15 normal f/u 33yrs BMD:   none TDaP:  2017 Pneumonia vaccine(s):  no Shingrix:   First one 2020.  Pt aware to just finish the series.   Hep C testing: neg 2018 Screening Labs: 04/2020   reports that she has never smoked. She has never used smokeless tobacco. She reports current alcohol use. She reports that she does not use drugs.  Past Medical History:  Diagnosis Date  . Bursitis of hip    bilateral  . Deviated nasal septum   . Factor V Leiden (HCC)   . Fibromyalgia 2013   Dr. Corliss Skains  . 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 Mitral valve prolapse   . Molluscum contagiosum   . Obesity   . Plantar fasciitis   . Tendonitis of elbow, right     Past Surgical History:  Procedure Laterality Date  . CESAREAN SECTION  1997, 2000  . TEMPOROMANDIBULAR JOINT SURGERY  1988  . WISDOM TOOTH EXTRACTION      Current Outpatient Medications  Medication Sig Dispense Refill  . cetirizine (ZYRTEC) 10 MG tablet Take 10 mg by mouth daily.      . Coenzyme Q10 (CO Q 10 PO) Take by mouth daily.    . cyanocobalamin 500 MCG tablet Take 500 mcg by mouth daily.    . fluticasone (FLONASE) 50 MCG/ACT nasal spray Place 2 sprays into both nostrils daily. 16  g 6  . Magnesium 500 MG CAPS Take by mouth daily.    . methocarbamol (ROBAXIN) 500 MG tablet TAKE 1 TABLET BY MOUTH AT 7 AM AND 1 TABLET AT 2 PM AS NEEDED 60 tablet 3  . Misc Natural Products (TART CHERRY ADVANCED PO) Take by mouth.    . Multiple Vitamins-Minerals (MULTIVITAMIN WITH MINERALS) tablet Take 1 tablet by mouth daily.      . nadolol (CORGARD) 40 MG tablet Take 40 mg by mouth daily.      Marland Kitchen omeprazole (PRILOSEC) 20 MG capsule Take 20 mg by mouth daily.      . Probiotic Product (PROBIOTIC DAILY PO) Take by mouth daily.    . traMADol (ULTRAM) 50 MG tablet Take 1 tablet (50 mg total) by mouth daily as needed. 30 tablet 0  . TURMERIC PO Take 450 mg by mouth daily.    . Vitamin D, Ergocalciferol, (DRISDOL) 1.25 MG (50000 UT) CAPS capsule Take 1 capsule (50,000 Units total) by mouth every 14 (fourteen) days. 6 capsule 4   No current facility-administered medications for this visit.    Family History  Problem Relation Age of Onset  . Colon cancer Sister 5  pre-colon cancer-had surgery  . Heart disease Sister   . Graves' disease Sister   . Hashimoto's thyroiditis Sister   . Diabetes Father   . Hypertension Father   . Hyperlipidemia Father   . Parkinson's disease Father   . COPD Mother   . Rheum arthritis Mother   . Hyperlipidemia Mother   . Heart attack Mother        01/2016  . Breast cancer Maternal Aunt   . Hyperparathyroidism Neg Hx     Review of Systems  Constitutional: Negative.   HENT: Negative.   Eyes: Negative.   Respiratory: Negative.   Cardiovascular: Negative.   Gastrointestinal: Negative.   Endocrine: Negative.   Genitourinary: Negative.   Musculoskeletal: Negative.   Skin: Negative.   Allergic/Immunologic: Negative.   Neurological: Negative.   Hematological: Negative.   Psychiatric/Behavioral: Negative.     Exam:   BP 110/80   Pulse 70   Resp 16   Ht 5' 7.25" (1.708 m)   Wt 212 lb (96.2 kg)   LMP 12/22/2012   BMI 32.96 kg/m   Height: 5'  7.25" (170.8 cm)  General appearance: alert, cooperative and appears stated age Head: Normocephalic, without obvious abnormality, atraumatic Neck: no adenopathy, supple, symmetrical, trachea midline and thyroid normal to inspection and palpation Lungs: clear to auscultation bilaterally Breasts: normal appearance, no masses or tenderness Heart: regular rate and rhythm Abdomen: soft, non-tender; bowel sounds normal; no masses,  no organomegaly Extremities: extremities normal, atraumatic, no cyanosis or edema Skin: Skin color, texture, turgor normal. No rashes or lesions Lymph nodes: Cervical, supraclavicular, and axillary nodes normal. No abnormal inguinal nodes palpated Neurologic: Grossly normal   Pelvic: External genitalia:  no lesions              Urethra:  normal appearing urethra with no masses, tenderness or lesions              Bartholins and Skenes: normal                 Vagina: normal appearing vagina with normal color and discharge, no lesions              Cervix: no lesions              Pap taken: Yes.   Bimanual Exam:  Uterus:  normal size, contour, position, consistency, mobility, non-tender              Adnexa: normal adnexa and no mass, fullness, tenderness               Rectovaginal: Confirms               Anus:  normal sphincter tone, no lesions  Chaperone, Zenovia Jordan, CMA, was present for exam.  A:  Well Woman with normal exam PMP, no HRT H/o +HR HPV 2014 (colpo and ECC was negative)  Follow up paps have been normal/neg HR HPV. H/O Vit D def with elevated PTH (normalized after Vit D level became normal) Family hx of adenomatous colon polyps with sister Myofascial pain syndrome followed by Dr. Corliss Skains  Factor V leiden  P:   Mammogram guidelines reviewed pap smear obtained today.  Pt desires yearly pap smears.  Will repeat HR HPV next Vaccines reviewed Colonoscopy due 2024 Consider BMD closer to 60 RF for Vit D 50K every 14 days.  #6/4RF Plan lab  work next year unless she sees Dr. Chanetta Marshall earlier. Return annually or prn

## 2020-05-12 NOTE — Telephone Encounter (Signed)
Last Visit: 04/08/2020 Next Visit: 10/07/2020 UDS:04/08/2020 Narc Agreement: 04/19/2020  Last Fill: 03/22/2020  Okay to refill Tramadol?

## 2020-05-17 ENCOUNTER — Other Ambulatory Visit: Payer: Self-pay

## 2020-05-17 ENCOUNTER — Other Ambulatory Visit (HOSPITAL_COMMUNITY)
Admission: RE | Admit: 2020-05-17 | Discharge: 2020-05-17 | Disposition: A | Discharge status: Home / Self Care | Payer: 59 | Source: Ambulatory Visit | Attending: Obstetrics & Gynecology | Admitting: Obstetrics & Gynecology

## 2020-05-17 ENCOUNTER — Encounter: Payer: Self-pay | Admitting: Obstetrics & Gynecology

## 2020-05-17 ENCOUNTER — Ambulatory Visit (INDEPENDENT_AMBULATORY_CARE_PROVIDER_SITE_OTHER): Payer: 59 | Admitting: Obstetrics & Gynecology

## 2020-05-17 VITALS — BP 110/80 | HR 70 | Resp 16 | Ht 67.25 in | Wt 212.0 lb

## 2020-05-17 DIAGNOSIS — Z124 Encounter for screening for malignant neoplasm of cervix: Secondary | ICD-10-CM

## 2020-05-17 DIAGNOSIS — Z01419 Encounter for gynecological examination (general) (routine) without abnormal findings: Secondary | ICD-10-CM | POA: Diagnosis not present

## 2020-05-17 DIAGNOSIS — Z0000 Encounter for general adult medical examination without abnormal findings: Secondary | ICD-10-CM | POA: Diagnosis not present

## 2020-05-17 MED ORDER — VITAMIN D (ERGOCALCIFEROL) 1.25 MG (50000 UNIT) PO CAPS: 50000.0000 [IU] | ORAL_CAPSULE | ORAL | 4 refills | Status: DC

## 2020-05-18 LAB — CYTOLOGY - PAP
Adequacy: ABSENT
Comment: NEGATIVE
Diagnosis: NEGATIVE
High risk HPV: NEGATIVE

## 2020-05-20 ENCOUNTER — Encounter: Payer: Self-pay | Admitting: Obstetrics & Gynecology

## 2020-05-24 ENCOUNTER — Other Ambulatory Visit: Payer: Self-pay

## 2020-05-24 ENCOUNTER — Ambulatory Visit
Admission: RE | Admit: 2020-05-24 | Discharge: 2020-05-24 | Disposition: A | Discharge status: Home / Self Care | Payer: 59 | Source: Ambulatory Visit | Attending: Family Medicine | Admitting: Family Medicine

## 2020-05-24 DIAGNOSIS — Z1231 Encounter for screening mammogram for malignant neoplasm of breast: Secondary | ICD-10-CM

## 2020-06-20 ENCOUNTER — Other Ambulatory Visit: Payer: Self-pay | Admitting: Rheumatology

## 2020-06-21 NOTE — Telephone Encounter (Signed)
Last Visit: 04/08/2020 Next Visit: 10/07/2020 UDS:04/08/2020 Narc Agreement: 04/19/2020  Last Fill: 05/12/2020  Okay to refill Tramadol?

## 2020-06-24 ENCOUNTER — Ambulatory Visit: Payer: 59 | Admitting: Cardiovascular Disease

## 2020-07-04 ENCOUNTER — Encounter: Payer: Self-pay | Admitting: Physician Assistant

## 2020-07-04 ENCOUNTER — Telehealth: Payer: 59 | Admitting: Physician Assistant

## 2020-07-04 DIAGNOSIS — J018 Other acute sinusitis: Secondary | ICD-10-CM | POA: Diagnosis not present

## 2020-07-04 DIAGNOSIS — K130 Diseases of lips: Secondary | ICD-10-CM

## 2020-07-04 MED ORDER — DOXYCYCLINE HYCLATE 100 MG PO TABS: 100.0000 mg | ORAL_TABLET | Freq: Two times a day (BID) | ORAL | 0 refills | Status: DC

## 2020-07-04 NOTE — Progress Notes (Signed)
We are sorry that you are not feeling well.  Here is how we plan to help!  Based on what you have shared with me it looks like you have sinusitis.  Sinusitis is inflammation and infection in the sinus cavities of the head.  Based on your presentation I believe you most likely have Acute Bacterial Sinusitis.  This is an infection caused by bacteria and is treated with antibiotics. I have prescribed Augmentin 875mg /125mg  one tablet twice daily with food, for 7 days. You may use an oral decongestant such as Mucinex D or if you have glaucoma or high blood pressure use plain Mucinex. Saline nasal spray help and can safely be used as often as needed for congestion.  If you develop worsening sinus pain, fever or notice severe headache and vision changes, or if symptoms are not better after completion of antibiotic, please schedule an appointment with a health care provider.    Sinus infections are not as easily transmitted as other respiratory infection, however we still recommend that you avoid close contact with loved ones, especially the very young and elderly.  Remember to wash your hands thoroughly throughout the day as this is the number one way to prevent the spread of infection!  For your symptoms of the lips, please follow up with your doctor.  Home Care:  Only take medications as instructed by your medical team.  Complete the entire course of an antibiotic.  Do not take these medications with alcohol.  A steam or ultrasonic humidifier can help congestion.  You can place a towel over your head and breathe in the steam from hot water coming from a faucet.  Avoid close contacts especially the very young and the elderly.  Cover your mouth when you cough or sneeze.  Always remember to wash your hands.  Get Help Right Away If:  You develop worsening fever or sinus pain.  You develop a severe head ache or visual changes.  Your symptoms persist after you have completed your treatment  plan.  Make sure you  Understand these instructions.  Will watch your condition.  Will get help right away if you are not doing well or get worse.  Your e-visit answers were reviewed by a board certified advanced clinical practitioner to complete your personal care plan.  Depending on the condition, your plan could have included both over the counter or prescription medications.  If there is a problem please reply  once you have received a response from your provider.  Your safety is important to .  If you have drug allergies check your prescription carefully.    You can use MyChart to ask questions about today's visit, request a non-urgent call back, or ask for a work or school excuse for 24 hours related to this e-Visit. If it has been greater than 24 hours you will need to follow up with your provider, or enter a new e-Visit to address those concerns.  You will get an e-mail in the next two days asking about your experience.  I hope that your e-visit has been valuable and will speed your recovery. Thank you for using e-visits.   I spent 5-10 minutes on review and completion of this note- [place] [facility]

## 2020-08-13 ENCOUNTER — Ambulatory Visit: Payer: 59 | Admitting: Obstetrics & Gynecology

## 2020-08-19 ENCOUNTER — Other Ambulatory Visit: Payer: Self-pay | Admitting: Rheumatology

## 2020-08-19 NOTE — Telephone Encounter (Signed)
Last Visit: 04/08/2020 Next Visit: 10/07/2020 UDS: 04/08/2020, UDS is negative for tramadol. She takes tramadol very sparingly for moderate to severe pain relief.  Narc Agreement: 04/08/2020  Last Fill: 06/21/2020,   Office visit 04/08/2020:  tramadol 50 mg 1 tablet daily as needed.  Okay to refill Tramadol?

## 2020-09-24 NOTE — Progress Notes (Deleted)
Office Visit Note  Patient: Brianna [name]             Date of Birth: [birth year]           MRN: [id number]             PCP: Glenis Smoker, MD Referring: Glenis Smoker, * Visit Date: 10/07/2020 Occupation: @GUAROCC @  Subjective:    History of Present Illness: [name] is a 55 y.o. female with history of myofascial pain syndrome and osteoarthritis.  She takes methocarbamol 500 mg twice daily as needed and tramadol 50 mg 1 tablet by mouth daily as needed for pain relief. She continues to take turmeric, tart cherry, coenzyme Q 10, and magnesium on a daily basis.  Activities of Daily Living:  Patient reports morning stiffness for *** {minute/hour:19697}.   Patient {ACTIONS;DENIES/REPORTS:21021675::"Denies"} nocturnal pain.  Difficulty dressing/grooming: {ACTIONS;DENIES/REPORTS:21021675::"Denies"} Difficulty climbing stairs: {ACTIONS;DENIES/REPORTS:21021675::"Denies"} Difficulty getting out of chair: {ACTIONS;DENIES/REPORTS:21021675::"Denies"} Difficulty using hands for taps, buttons, cutlery, and/or writing: {ACTIONS;DENIES/REPORTS:21021675::"Denies"}  No Rheumatology ROS completed.   PMFS History:  Patient Active Problem List   Diagnosis Date Noted  . Cracked lips 07/04/2020  . Deviated septum 04/08/2019  . Chronic nasal congestion 04/08/2019  . Hypertrophy of both inferior nasal turbinates 04/08/2019  . Myofascial pain syndrome 08/17/2016  . HLA B27 (HLA B27 positive) 08/17/2016  . Trochanteric bursitis of both hips 08/17/2016  . History of migraine 08/17/2016  . Primary osteoarthritis of both hands 08/17/2016  . Primary osteoarthritis of both feet 08/17/2016  . Plantar fasciitis 08/17/2016  . Hyperparathyroidism ([place]) 09/20/2015  . 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 Bursitis of hip    bilateral  . Deviated nasal septum   . Factor V Leiden ([place])   . 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  Mitral valve prolapse   . Molluscum contagiosum   . Myofascial pain 2013   Dr. Estanislado Pandy  . Obesity   . Plantar fasciitis   . Tendonitis of elbow, right     Family History  Problem Relation Age of Onset  . Colon cancer Sister 26       pre-colon cancer-had surgery  . Heart disease Sister   . Graves' disease Sister   . Hashimoto's thyroiditis Sister   . Diabetes Father   . Hypertension Father   . Hyperlipidemia Father   . Parkinson's disease Father   . COPD Mother   . Rheum arthritis Mother   . Hyperlipidemia Mother   . Heart attack Mother        01/2016  . Breast cancer Maternal Aunt   . Hyperparathyroidism Neg Hx    Past Surgical History:  Procedure Laterality Date  . CESAREAN SECTION  1997, 2000  . [place]  . WISDOM TOOTH EXTRACTION     Social History   Social History Narrative   Patient is right-handed. She lives with her husband in a 2 level home. She drinks tea 1-2 x a day. And an occasional soda. She walks most days for 30 minutes.   Immunization History  Administered Date(s) Administered  . Influenza,inj,Quad PF,6+ Mos 04/22/2019  . Influenza-Unspecified 05/18/2020  . Moderna Sars-Covid-2 Vaccination 07/29/2019, 08/26/2019  . Tdap 07/30/2015  . Zoster Recombinat (Shingrix) 08/24/2018     Objective: Vital Signs: LMP 12/22/2012    Physical Exam Vitals and nursing note reviewed.  Constitutional:      Appearance: She is  well-developed.  HENT:     Head: Normocephalic and atraumatic.  Eyes:     Conjunctiva/sclera: Conjunctivae normal.  Pulmonary:     Effort: Pulmonary effort is normal.  Abdominal:     Palpations: Abdomen is soft.  Musculoskeletal:     Cervical back: Normal range of motion.  Skin:    General: Skin is warm and dry.     Capillary Refill: Capillary refill takes less than 2 seconds.  Neurological:     Mental Status: She is alert and oriented to person, place, and time.  Psychiatric:        Behavior:  Behavior normal.      Musculoskeletal Exam: ***  CDAI Exam: CDAI Score: - Patient Global: -; Provider Global: - Swollen: -; Tender: - Joint Exam 10/07/2020   No joint exam has been documented for this visit   There is currently no information documented on the homunculus. Go to the Rheumatology activity and complete the homunculus joint exam.  Investigation: No additional findings.  Imaging: No results found.  Recent Labs: Lab Results  Component Value Date   WBC 5.8 05/16/2019   HGB 13.6 05/16/2019   PLT 275 05/16/2019   NA 143 05/16/2019   K 4.5 05/16/2019   CL 105 05/16/2019   CO2 24 05/16/2019   GLUCOSE 82 05/16/2019   BUN 14 05/16/2019   CREATININE 0.72 05/16/2019   BILITOT 0.6 05/16/2019   ALKPHOS 182 (H) 05/16/2019   AST 19 05/16/2019   ALT 16 05/16/2019   PROT 7.1 05/16/2019   ALBUMIN 4.6 05/16/2019   CALCIUM 9.6 05/16/2019   GFRAA 110 05/16/2019    Speciality Comments: No specialty comments available.  Procedures:  No procedures performed Allergies: Z-pak [azithromycin], Amoxicillin-pot clavulanate, and Sulfa antibiotics   Assessment / Plan:     Visit Diagnoses: Myofascial pain syndrome  Trochanteric bursitis of both hips  Primary osteoarthritis of both hands  Primary osteoarthritis of both feet  Plantar fasciitis  HLA B27 (HLA B27 positive)  Vitamin D deficiency  Hyperparathyroidism ([place])  History of migraine  Orders: No orders of the defined types were placed in this encounter.  No orders of the defined types were placed in this encounter. Follow-Up Instructions: No follow-ups on file.   Ofilia Neas, PA-C  Note - This record has been created using Dragon software.  Chart creation errors have been sought, but may not always  have been located. Such creation errors do not reflect on  the standard of medical care.

## 2020-10-07 ENCOUNTER — Ambulatory Visit: Payer: 59 | Admitting: Physician Assistant

## 2020-10-07 DIAGNOSIS — E559 Vitamin D deficiency, unspecified: Secondary | ICD-10-CM

## 2020-10-07 DIAGNOSIS — M19042 Primary osteoarthritis, left hand: Secondary | ICD-10-CM

## 2020-10-07 DIAGNOSIS — M7918 Myalgia, other site: Secondary | ICD-10-CM

## 2020-10-07 DIAGNOSIS — M7061 Trochanteric bursitis, right hip: Secondary | ICD-10-CM

## 2020-10-07 DIAGNOSIS — M19071 Primary osteoarthritis, right ankle and foot: Secondary | ICD-10-CM

## 2020-10-07 DIAGNOSIS — Z1589 Genetic susceptibility to other disease: Secondary | ICD-10-CM

## 2020-10-07 DIAGNOSIS — E213 Hyperparathyroidism, unspecified: Secondary | ICD-10-CM

## 2020-10-07 DIAGNOSIS — Z8669 Personal history of other diseases of the nervous system and sense organs: Secondary | ICD-10-CM

## 2020-10-07 DIAGNOSIS — M722 Plantar fascial fibromatosis: Secondary | ICD-10-CM

## 2020-10-08 ENCOUNTER — Encounter: Payer: Self-pay | Admitting: Cardiovascular Disease

## 2020-10-08 ENCOUNTER — Other Ambulatory Visit: Payer: Self-pay

## 2020-10-08 ENCOUNTER — Ambulatory Visit (INDEPENDENT_AMBULATORY_CARE_PROVIDER_SITE_OTHER): Payer: 59 | Admitting: Cardiovascular Disease

## 2020-10-08 VITALS — BP 110/71 | HR 61 | Ht 69.0 in | Wt 216.2 lb

## 2020-10-08 DIAGNOSIS — I471 Supraventricular tachycardia: Secondary | ICD-10-CM | POA: Diagnosis not present

## 2020-10-08 DIAGNOSIS — R002 Palpitations: Secondary | ICD-10-CM

## 2020-10-08 DIAGNOSIS — I4719 Other supraventricular tachycardia: Secondary | ICD-10-CM

## 2020-10-08 NOTE — Patient Instructions (Signed)

## 2020-10-08 NOTE — Progress Notes (Signed)
Cardiology Consultation Note:    Date:  10/13/2020   ID:  Brianna [name], DOB Feb 11, 1966, MRN [id number]  PCP:  Brianna Hale, MD  Cardiologist:  Brianna Fair, MD  Electrophysiologist:  None   Referring MD: Brianna [name], * MD  Chief Complaint  Patient presents with  . Irregular Heart Beat     History of Present Illness:    Brianna [name] is a 55 y.o. female with a hx of questionable diagnosis of mitral valve prolapse, fibromyalgia, factor V Leiden without clinical thromboembolic events, referred for palpitations.  Isolated palpitations are frequent, but sustained arrhythmia is rare and not severely symptomatic. Event monitor showed ectopic atrial tachycardia, no atrial fibrillation.  Evaluation for palpitations in the late 80s led to a diagnosis of mitral valve prolapse, but an echocardiogram in 2016 did not confirm this.  The mitral valve appears structurally normal and showed only mild regurgitation left ventricular ejection fraction was 55 to 60%.  The study was interpreted showing pseudonormal mitral inflow pattern, but I believe that her mitral annulus Doppler velocities are probably normal (normal diastolic function).  The left atrium was mildly dilated.  Past Medical History:  Diagnosis Date  . Bursitis of hip    bilateral  . Deviated nasal septum   . Factor V Leiden ([facility])   . 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 Mitral valve prolapse   . Molluscum contagiosum   . Myofascial pain 2013   Dr. Corliss Skains  . Obesity   . Plantar fasciitis   . Tendonitis of elbow, right     Past Surgical History:  Procedure Laterality Date  . CESAREAN SECTION  1997, 2000  . TEMPOROMANDIBULAR JOINT SURGERY  1988  . WISDOM TOOTH EXTRACTION      Current Medications: Current Meds  Medication Sig  . cetirizine (ZYRTEC) 10 MG tablet Take 10 mg by mouth daily.  . Coenzyme Q10 (CO Q 10 PO) Take by mouth daily.  . cyanocobalamin 500 MCG tablet Take 500 mcg  by mouth daily.  Marland Kitchen doxycycline (VIBRA-TABS) 100 MG tablet Take 1 tablet (100 mg total) by mouth 2 (two) times daily.  . fluticasone (FLONASE) 50 MCG/ACT nasal spray Place 2 sprays into both nostrils daily.  . Magnesium 500 MG CAPS Take by mouth daily.  . methocarbamol (ROBAXIN) 500 MG tablet TAKE 1 TABLET BY MOUTH AT 7 AM AND 1 TABLET AT 2 PM AS NEEDED  . Misc Natural Products (TART CHERRY ADVANCED PO) Take by mouth.  . Multiple Vitamins-Minerals (MULTIVITAMIN WITH MINERALS) tablet Take 1 tablet by mouth daily.  . nadolol (CORGARD) 40 MG tablet Take 40 mg by mouth daily.  Marland Kitchen omeprazole (PRILOSEC) 20 MG capsule Take 20 mg by mouth daily.  . Probiotic Product (PROBIOTIC DAILY PO) Take by mouth daily.  . traMADol (ULTRAM) 50 MG tablet TAKE 1 TABLET BY MOUTH EVERY DAY AS NEEDED  . TURMERIC PO Take 450 mg by mouth daily.  . Vitamin D, Ergocalciferol, (DRISDOL) 1.25 MG (50000 UNIT) CAPS capsule Take 1 capsule (50,000 Units total) by mouth every 14 (fourteen) days.     Allergies:   Z-pak [azithromycin], Amoxicillin-pot clavulanate, and Sulfa antibiotics   Social History   Socioeconomic History  . Marital status: Married    Spouse name: Brianna [name]  . Number of children: 2  . Years of education: Not on file  . Highest education level: Associate degree: academic program  Occupational History  . Not on file  Tobacco  Use  . Smoking status: Never Smoker  . Smokeless tobacco: Never Used  Vaping Use  . Vaping Use: Never used  Substance and Sexual Activity  . Alcohol use: Yes    Comment: rarely  . Drug use: No  . Sexual activity: Yes    Partners: Male    Birth control/protection: Other-see comments, Post-menopausal    Comment: vasectomy  Other Topics Concern  . Not on file  Social History Narrative   Patient is right-handed. She lives with her husband in a 2 level home. She drinks tea 1-2 x a day. And an occasional soda. She walks most days for 30 minutes.   Social Determinants of Health    Financial Resource Strain: Not on file  Food Insecurity: Not on file  Transportation Needs: Not on file  Physical Activity: Not on file  Stress: Not on file  Social Connections: Not on file     Family History: The patient's family history includes Breast cancer in her maternal aunt; COPD in her mother; Colon cancer (age of onset: 39) in her sister; Diabetes in her father; Brianna [name]' disease in her sister; Hashimoto's thyroiditis in her sister; Heart attack in her mother; Heart disease in her sister; Hyperlipidemia in her father and mother; Hypertension in her father; Parkinson's disease in her father; Rheum arthritis in her mother. There is no history of Hyperparathyroidism.  ROS:   Please see the history of present illness.     All other systems reviewed and are negative.  EKGs/Labs/Other Studies Reviewed:    The following studies were reviewed today: EVENT MONITOR 07/31/2019  The dominant rhythm is normal sinus rhythm with normal circadian variation.  There are occasional brief episodes of nonsustained ectopic atrial tachycardia, 5-8 beats long.  There is no meaningful ventricular arrhythmia and no atrial fibrillation.  There is no significant bradycardia   Mildly abnormal event monitor due to the presence of occasional brief nonsustained atrial tachycardia.    EKG:  EKG is  ordered today. It shows NSR, normal ECG.  Recent Labs: No results found for requested labs within last 8760 hours.  Recent Lipid Panel    Component Value Date/Time   CHOL 206 (H) 05/16/2019 1143   TRIG 47 05/16/2019 1143   HDL 74 05/16/2019 1143   CHOLHDL 2.8 05/16/2019 1143   CHOLHDL 2.9 11/16/2016 1012   VLDL 11 11/16/2016 1012   LDLCALC 123 (H) 05/16/2019 1143    Physical Exam:    VS:  BP 110/71   Pulse 61   Ht 5\' 9"  (1.753 m)   Wt 216 lb 3.2 oz (98.1 kg)   LMP 12/22/2012   SpO2 99%   BMI 31.93 kg/m     Wt Readings from Last 3 Encounters:  10/08/20 216 lb 3.2 oz (98.1 kg)   05/17/20 212 lb (96.2 kg)  04/08/20 216 lb 9.6 oz (98.2 kg)     GEN: Mildly obese, well nourished, well developed in no acute distress HEENT: Normal NECK: No JVD; No carotid bruits LYMPHATICS: No lymphadenopathy CARDIAC: RRR, no murmurs, rubs, gallops RESPIRATORY:  Clear to auscultation without rales, wheezing or rhonchi  ABDOMEN: Soft, non-tender, non-distended MUSCULOSKELETAL:  No edema; No deformity  SKIN: Warm and dry NEUROLOGIC:  Alert and oriented x 3 PSYCHIATRIC:  Normal affect   ASSESSMENT:    1. PAT (paroxysmal atrial tachycardia) (HCC)    PLAN:    In order of problems listed above:  1. Palpitations: event monitor showed ectopic atrial tachycardia. Symptoms are generally well controlled  on nadolol. No significant structural abnormalities. Continue current care.    Medication Adjustments/Labs and Tests Ordered: Current medicines are reviewed at length with the patient today.  Concerns regarding medicines are outlined above.  Orders Placed This Encounter  Procedures  . EKG 12-Lead   No orders of the defined types were placed in this encounter.   Patient Instructions  Medication Instructions:  No changes *If you need a refill on your cardiac medications before your next appointment, please call your pharmacy*   Lab Work: None ordered If you have labs (blood work) drawn today and your tests are completely normal, you will receive your results only by: Marland Kitchen MyChart Message (if you have MyChart) OR . A paper copy in the mail If you have any lab test that is abnormal or we need to change your treatment, we will call you to review the results.   Testing/Procedures: None ordered   Follow-Up: At [facility], you and your health needs are our priority.  As part of our continuing mission to provide you with exceptional heart care, we have created designated Provider Care Teams.  These Care Teams include your primary Cardiologist (physician) and Advanced Practice  Providers (APPs -  Physician Assistants and Nurse Practitioners) who all work together to provide you with the care you need, when you need it.  We recommend signing up for the patient portal called "MyChart".  Sign up information is provided on this After Visit Summary.  MyChart is used to connect with patients for Virtual Visits (Telemedicine).  Patients are able to view lab/test results, encounter notes, upcoming appointments, etc.  Non-urgent messages can be sent to your provider as well.   To learn more about what you can do with MyChart, go to [email or web address].    Your next appointment:   12 month(s)  The format for your next appointment:   In Person  Provider:   You may see Brianna Fair, MD or one of the following Advanced Practice Providers on your designated Care Team:    Azalee Course, PA-C  Micah Flesher, [place] or   Judy Pimple, PA-C       Signed, Brianna Fair, MD  10/13/2020 3:44 PM    [facility]

## 2020-10-19 ENCOUNTER — Encounter: Payer: Self-pay | Admitting: Family Medicine

## 2020-10-19 ENCOUNTER — Other Ambulatory Visit: Payer: Self-pay

## 2020-10-19 ENCOUNTER — Ambulatory Visit (INDEPENDENT_AMBULATORY_CARE_PROVIDER_SITE_OTHER): Payer: 59 | Admitting: Family Medicine

## 2020-10-19 DIAGNOSIS — M79601 Pain in right arm: Secondary | ICD-10-CM

## 2020-10-19 MED ORDER — DICLOFENAC SODIUM 1 % EX GEL: 4.0000 g | Freq: Four times a day (QID) | CUTANEOUS | 6 refills | Status: DC | PRN

## 2020-10-19 NOTE — Progress Notes (Signed)
I saw and examined the patient with Dr. Marga Hoots and agree with assessment and plan as outlined.    Right proximal forearm/distal biceps pain, hurts with forearm supination.  Tender in muscle belly just lateral to biceps tendon.    Will treat with home exercises, voltaren gel.  If pain persists, then PT.

## 2020-10-19 NOTE — Progress Notes (Signed)
Office Visit Note   Patient: Brianna [name]           Date of Birth: 09/09/1965           MRN: [id number] Visit Date: 10/19/2020 Requested by: Glenis Smoker, MD [place],  [place] [id number] PCP: Glenis Smoker, MD  Subjective: Chief Complaint  Patient presents with  . Right Arm - Pain    Pain in the upper arm and into the shoulder, and sometimes neck, x 2 weeks. 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 The fingers of the right hand are numb in the morning, if she lies on the right side. The pain does wake her if she lies on the right side.    HPI: 55yo F presenting to clinic with concerns of one month of anterior right elbow pain. 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 She thinks she may have pulled a muscle in her elbow, and is concerned that it has not improved over the past month. She has tried heat and massage at home, with some relief. She was a little concerned that she had neck pain with this last week, but says her elbow pain predated the neck pain, and her neck discomfort has essentially resolved at this time.  She also says she wakes up in the morning with hand numbness, though says she has never really paid attention to what fingers are affected. The numbness wears away very quickly after she wakes up, and she denies any lingering weakness. No numbness now. She admits that she sleeps with her arms tightly folded beneath her head.               ROS:   All other systems were reviewed and are negative.  Objective: Vital Signs: LMP 12/22/2012   Physical Exam:  General:  Alert and oriented, in no acute distress. Pulm:  Breathing unlabored. Psy:  Normal mood, congruent affect. Skin:  Right arm with no bruising, rashes, or erythema. Overlying skin intact.   Right Elbow/ Wrist Exam:  No obvious swelling,  deformity or discoloration.  ROM: Full Active ROM in elbow, wrist, and all fingers without pain.  Strength: No pain with resisted extension or flexion of the wrist, however does endorse discomfort with resisted supination. No pain with resisted third finger extension.   Normal Grip strength.   Palpation: Tenderness to palpation just lateral to distal biceps insertion. Some tenderness over lateral epicondyle, though this is less significant.   Sensation intact throughout all fingers. Tinel's negative at carpal and cubital tunnel.  Sperling Negative.    Strength testing:  5 out of 5 strength with shoulder abduction (C5), wrist extension (C6), wrist flexion (C7), grip strength (C8), and finger abduction (T1).  Brisk capillary refill.    Imaging: No results found.  Assessment & Plan: 55yo F presenting to clinic with right anterior elbow pain x43mo. This is atraumatic in nature, however she does endorse having to use this arm quite heavily to help her ailing father. Suspect an overuse tendinopathy, possibly of distal biceps vs supinator.  - Provided with HEP to help improve her symptoms at home - Discussed Voltaren Gel for pain control - If no benefit with HEP, could consider formal physical therapy to help further improve her symptoms. - Patient is agreeable with assessment and plan. She has no further  questions or concerns today.     Procedures: No procedures performed        PMFS History: Patient Active Problem List   Diagnosis Date Noted  . Cracked lips 07/04/2020  . Deviated septum 04/08/2019  . Chronic nasal congestion 04/08/2019  . Hypertrophy of both inferior nasal turbinates 04/08/2019  . Myofascial pain syndrome 08/17/2016  . HLA B27 (HLA B27 positive) 08/17/2016  . Trochanteric bursitis of both hips 08/17/2016  . History of migraine 08/17/2016  . Primary osteoarthritis of both hands 08/17/2016  . Primary osteoarthritis of both feet 08/17/2016  . Plantar  fasciitis 08/17/2016  . Hyperparathyroidism ([place]) 09/20/2015  . High risk HPV infection 05/19/2013   Past Medical History:  Diagnosis Date  . Bursitis of hip    bilateral  . Deviated nasal septum   . Factor V Leiden ([place])   . 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 Mitral valve prolapse   . Molluscum contagiosum   . Myofascial pain 2013   Dr. Estanislado Pandy  . Obesity   . Plantar fasciitis   . Tendonitis of elbow, right     Family History  Problem Relation Age of Onset  . Colon cancer Sister 68       pre-colon cancer-had surgery  . Heart disease Sister   . Graves' disease Sister   . Hashimoto's thyroiditis Sister   . Diabetes Father   . Hypertension Father   . Hyperlipidemia Father   . Parkinson's disease Father   . COPD Mother   . Rheum arthritis Mother   . Hyperlipidemia Mother   . Heart attack Mother        01/2016  . Breast cancer Maternal Aunt   . Hyperparathyroidism Neg Hx     Past Surgical History:  Procedure Laterality Date  . CESAREAN SECTION  1997, 2000  . [place]  . WISDOM TOOTH EXTRACTION     Social History   Occupational History  . Not on file  Tobacco Use  . Smoking status: Never Smoker  . Smokeless tobacco: Never Used  Vaping Use  . Vaping Use: Never used  Substance and Sexual Activity  . Alcohol use: Yes    Comment: rarely  . Drug use: No  . Sexual activity: Yes    Partners: Male    Birth control/protection: Other-see comments, Post-menopausal    Comment: vasectomy

## 2020-10-27 NOTE — Progress Notes (Deleted)
Office Visit Note  Patient: Brianna [name]             Date of Birth: [birth year]           MRN: [id number]             PCP: Shon Hale, MD Referring: Shon Hale, * Visit Date: 11/08/2020 Occupation: @GUAROCC @  Subjective:    History of Present Illness: Brianna [name] is a 55 y.o. female with history of myofasical pain and osteoarthritis. She takes robaxin 500 mg BID PRN and tramadol 50 mg 1 to 2 tablets daily as needed for pain relief.    Activities of Daily Living:  Patient reports morning stiffness for *** {minute/hour:19697}.   Patient {ACTIONS;DENIES/REPORTS:21021675::"Denies"} nocturnal pain.  Difficulty dressing/grooming: {ACTIONS;DENIES/REPORTS:21021675::"Denies"} Difficulty climbing stairs: {ACTIONS;DENIES/REPORTS:21021675::"Denies"} Difficulty getting out of chair: {ACTIONS;DENIES/REPORTS:21021675::"Denies"} Difficulty using hands for taps, buttons, cutlery, and/or writing: {ACTIONS;DENIES/REPORTS:21021675::"Denies"}  No Rheumatology ROS completed.   PMFS History:  Patient Active Problem List   Diagnosis Date Noted  . Cracked lips 07/04/2020  . Deviated septum 04/08/2019  . Chronic nasal congestion 04/08/2019  . Hypertrophy of both inferior nasal turbinates 04/08/2019  . Myofascial pain syndrome 08/17/2016  . HLA B27 (HLA B27 positive) 08/17/2016  . Trochanteric bursitis of both hips 08/17/2016  . History of migraine 08/17/2016  . Primary osteoarthritis of both hands 08/17/2016  . Primary osteoarthritis of both feet 08/17/2016  . Plantar fasciitis 08/17/2016  . Hyperparathyroidism (HCC) 09/20/2015  . 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 Bursitis of hip    bilateral  . Deviated nasal septum   . Factor V Leiden (HCC)   . 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 Mitral valve prolapse   . Molluscum contagiosum   . Myofascial pain 2013   Dr. 2014  . Obesity   . Plantar fasciitis    . Tendonitis of elbow, right     Family History  Problem Relation Age of Onset  . Colon cancer Sister 22       pre-colon cancer-had surgery  . Heart disease Sister   . Graves' disease Sister   . Hashimoto's thyroiditis Sister   . Diabetes Father   . Hypertension Father   . Hyperlipidemia Father   . Parkinson's disease Father   . COPD Mother   . Rheum arthritis Mother   . Hyperlipidemia Mother   . Heart attack Mother        01/2016  . Breast cancer Maternal Aunt   . Hyperparathyroidism Neg Hx    Past Surgical History:  Procedure Laterality Date  . CESAREAN SECTION  1997, 2000  . TEMPOROMANDIBULAR JOINT SURGERY  1988  . WISDOM TOOTH EXTRACTION     Social History   Social History Narrative   Patient is right-handed. She lives with her husband in a 2 level home. She drinks tea 1-2 x a day. And an occasional soda. She walks most days for 30 minutes.   Immunization History  Administered Date(s) Administered  . Influenza,inj,Quad PF,6+ Mos 04/22/2019  . Influenza-Unspecified 05/18/2020  . Moderna Sars-Covid-2 Vaccination 07/29/2019, 08/26/2019  . Tdap 07/30/2015  . Zoster Recombinat (Shingrix) 08/24/2018     Objective: Vital Signs: LMP 12/22/2012    Physical Exam Vitals and nursing note reviewed.  Constitutional:      Appearance: She is well-developed.  HENT:     Head: Normocephalic and atraumatic.  Eyes:     Conjunctiva/sclera:  Conjunctivae normal.  Pulmonary:     Effort: Pulmonary effort is normal.  Abdominal:     Palpations: Abdomen is soft.  Musculoskeletal:     Cervical back: Normal range of motion.  Skin:    General: Skin is warm and dry.     Capillary Refill: Capillary refill takes less than 2 seconds.  Neurological:     Mental Status: She is alert and oriented to person, place, and time.  Psychiatric:        Behavior: Behavior normal.      Musculoskeletal Exam: ***  CDAI Exam: CDAI Score: -- Patient Global: --; Provider Global: -- Swollen:  --; Tender: -- Joint Exam 11/08/2020   No joint exam has been documented for this visit   There is currently no information documented on the homunculus. Go to the Rheumatology activity and complete the homunculus joint exam.  Investigation: No additional findings.  Imaging: No results found.  Recent Labs: Lab Results  Component Value Date   WBC 5.8 05/16/2019   HGB 13.6 05/16/2019   PLT 275 05/16/2019   NA 143 05/16/2019   K 4.5 05/16/2019   CL 105 05/16/2019   CO2 24 05/16/2019   GLUCOSE 82 05/16/2019   BUN 14 05/16/2019   CREATININE 0.72 05/16/2019   BILITOT 0.6 05/16/2019   ALKPHOS 182 (H) 05/16/2019   AST 19 05/16/2019   ALT 16 05/16/2019   PROT 7.1 05/16/2019   ALBUMIN 4.6 05/16/2019   CALCIUM 9.6 05/16/2019   GFRAA 110 05/16/2019    Speciality Comments: No specialty comments available.  Procedures:  No procedures performed Allergies: Z-pak [azithromycin], Amoxicillin-pot clavulanate, and Sulfa antibiotics   Assessment / Plan:     Visit Diagnoses: Myofascial pain syndrome  Medication monitoring encounter - tramadol 50 mg 1 tablet daily as needed. UDS & narcotic agreement: 04/08/2020   Primary osteoarthritis of both hands  Trochanteric bursitis of both hips  Primary osteoarthritis of both feet  Plantar fasciitis - Resolved.   HLA B27 (HLA B27 positive)  Vitamin D deficiency  Hyperparathyroidism ([place])  History of migraine  Orders: No orders of the defined types were placed in this encounter.  No orders of the defined types were placed in this encounter.   Follow-Up Instructions: No follow-ups on file.   Ofilia Neas, PA-C  Note - This record has been created using Dragon software.  Chart creation errors have been sought, but may not always  have been located. Such creation errors do not reflect on  the standard of medical care.

## 2020-11-08 ENCOUNTER — Ambulatory Visit: Payer: 59 | Admitting: Physician Assistant

## 2020-11-08 DIAGNOSIS — Z5181 Encounter for therapeutic drug level monitoring: Secondary | ICD-10-CM

## 2020-11-08 DIAGNOSIS — M722 Plantar fascial fibromatosis: Secondary | ICD-10-CM

## 2020-11-08 DIAGNOSIS — M19042 Primary osteoarthritis, left hand: Secondary | ICD-10-CM

## 2020-11-08 DIAGNOSIS — M7062 Trochanteric bursitis, left hip: Secondary | ICD-10-CM

## 2020-11-08 DIAGNOSIS — E559 Vitamin D deficiency, unspecified: Secondary | ICD-10-CM

## 2020-11-08 DIAGNOSIS — Z1589 Genetic susceptibility to other disease: Secondary | ICD-10-CM

## 2020-11-08 DIAGNOSIS — M19071 Primary osteoarthritis, right ankle and foot: Secondary | ICD-10-CM

## 2020-11-08 DIAGNOSIS — E213 Hyperparathyroidism, unspecified: Secondary | ICD-10-CM

## 2020-11-08 DIAGNOSIS — M7918 Myalgia, other site: Secondary | ICD-10-CM

## 2020-11-08 DIAGNOSIS — Z8669 Personal history of other diseases of the nervous system and sense organs: Secondary | ICD-10-CM

## 2020-11-10 NOTE — Progress Notes (Signed)
 Office Visit Note  Patient: Brianna [name]             Date of Birth: 03/08/1966           MRN: [id number]             PCP: Timberlake, Kathryn S, MD Referring: Timberlake, Kathryn S, * Visit Date: 11/11/2020 Occupation: @GUAROCC@  Subjective:  Right shoulder pain   History of Present Illness: Brianna [name] is a 54 y.o. female with history of myofascial pain syndrome and osteoarthritis.   She present today with increased pain in the right shoulder joint which started in December 2021.  According to the patient she has been having to help her mother act as the caregiver for her father who has parkinson's. She has been having to help lift him and reposition which likely contributed to her right shoulder pain.  She was evaluated by Dr. Hilts and was diagnosed with tendonitis.  She has been using ice and voltaren gel topically as needed for pain relief. She continues to take methocarbamol 500 mg twice daily as needed and tramadol 50 mg 1 tablet by mouth daily as needed for pain relief.  She has been performing home exercises but has not noticed any improvement in her right shoulder joint discomfort.  She continues to experience intermittent myalgias and muscle tenderness due to underlying myofascial pain syndrome.  She takes coenzyme every 10, turmeric, and magnesium as recommended.  She has been trying to perform stretching exercises on a daily basis and has also started to practice yoga.  She continues to have discomfort due to trochanteric bursitis bilaterally.  She has intermittent pain and stiffness in both hands but denies any joint swelling.     Activities of Daily Living:  Patient reports morning stiffness for 2-3  hours.   Patient Reports nocturnal pain.  Difficulty dressing/grooming: Denies Difficulty climbing stairs: Denies Difficulty getting out of chair: Denies Difficulty using hands for taps, buttons, cutlery, and/or writing: Denies  Review of Systems  Constitutional:  Positive for fatigue.  HENT: Negative for mouth sores, mouth dryness and nose dryness.   Eyes: Negative for pain, itching and dryness.  Respiratory: Negative for shortness of breath and difficulty breathing.   Cardiovascular: Positive for palpitations. Negative for chest pain.  Gastrointestinal: Negative for blood in stool, constipation and diarrhea.  Endocrine: Negative for increased urination.  Genitourinary: Negative for difficulty urinating.  Musculoskeletal: Positive for arthralgias, joint pain, joint swelling, myalgias, morning stiffness, muscle tenderness and myalgias.  Skin: Negative for color change, rash and redness.  Allergic/Immunologic: Negative for susceptible to infections.  Neurological: Positive for numbness and headaches. Negative for dizziness, memory loss and weakness.  Hematological: Positive for bruising/bleeding tendency.  Psychiatric/Behavioral: Positive for sleep disturbance. Negative for confusion.    PMFS History:  Patient Active Problem List   Diagnosis Date Noted  . Cracked lips 07/04/2020  . Deviated septum 04/08/2019  . Chronic nasal congestion 04/08/2019  . Hypertrophy of both inferior nasal turbinates 04/08/2019  . Myofascial pain syndrome 08/17/2016  . HLA B27 (HLA B27 positive) 08/17/2016  . Trochanteric bursitis of both hips 08/17/2016  . History of migraine 08/17/2016  . Primary osteoarthritis of both hands 08/17/2016  . Primary osteoarthritis of both feet 08/17/2016  . Plantar fasciitis 08/17/2016  . Hyperparathyroidism (HCC) 09/20/2015  . 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 Bursitis of hip    bilateral  . Deviated nasal septum   .   Factor V Leiden ([place])   . 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 Mitral valve prolapse   . Molluscum contagiosum   . Myofascial pain 2013   Dr. Estanislado Pandy  . Obesity   . Plantar fasciitis   . Tendonitis of elbow, right     Family History  Problem  Relation Age of Onset  . Colon cancer Sister 51       pre-colon cancer-had surgery  . Heart disease Sister   . Graves' disease Sister   . Hashimoto's thyroiditis Sister   . Diabetes Father   . Hypertension Father   . Hyperlipidemia Father   . Parkinson's disease Father   . COPD Mother   . Rheum arthritis Mother   . Hyperlipidemia Mother   . Heart attack Mother        01/2016  . Breast cancer Maternal Aunt   . Hyperparathyroidism Neg Hx    Past Surgical History:  Procedure Laterality Date  . CESAREAN SECTION  1997, 2000  . [place]  . WISDOM TOOTH EXTRACTION     Social History   Social History Narrative   Patient is right-handed. She lives with her husband in a 2 level home. She drinks tea 1-2 x a day. And an occasional soda. She walks most days for 30 minutes.   Immunization History  Administered Date(s) Administered  . Influenza,inj,Quad PF,6+ Mos 04/22/2019  . Influenza-Unspecified 05/18/2020  . Moderna Sars-Covid-2 Vaccination 07/29/2019, 08/26/2019  . Tdap 07/30/2015  . Zoster Recombinat (Shingrix) 08/24/2018     Objective: Vital Signs: BP 133/77 (BP Location: Left Arm, Patient Position: Sitting, Cuff Size: Normal)   Pulse 72   Resp 16   Ht 5' 9" (1.753 m)   Wt 218 lb 9.6 oz (99.2 kg)   LMP 12/22/2012   BMI 32.28 kg/m    Physical Exam Vitals and nursing note reviewed.  Constitutional:      Appearance: She is well-developed.  HENT:     Head: Normocephalic and atraumatic.  Eyes:     Conjunctiva/sclera: Conjunctivae normal.  Pulmonary:     Effort: Pulmonary effort is normal.  Abdominal:     Palpations: Abdomen is soft.  Musculoskeletal:     Cervical back: Normal range of motion.  Skin:    General: Skin is warm and dry.     Capillary Refill: Capillary refill takes less than 2 seconds.  Neurological:     Mental Status: She is alert and oriented to person, place, and time.  Psychiatric:        Behavior: Behavior normal.       Musculoskeletal Exam: C-spine, thoracic spine, and lumbar spine good ROM.  Shoulder joints have good ROM with some discomfort in the right shoulder.  Left shoulder has good ROM with no discomfort.  Elbow joints, wrist joints, MCPs, PIPs, and DIPs good ROM with no synovitis.  Complete fist formation bilaterally.  Mild PIP and DIP thickening consistent with osteoarthritis of both hands.  Hip joints good ROM with no discomfort.  Tenderness to palpation over bilateral trochanteric bursa.  Knee joints good ROM with no warmth or effusion.  Left knee crepitus noted.  Ankle joints good ROM with no tenderness or joint swelling.   CDAI Exam: CDAI Score: -- Patient Global: --; Provider Global: -- Swollen: --; Tender: -- Joint Exam 11/11/2020   No joint exam has been documented for this visit   There is currently no information documented on the homunculus.  Go to the Rheumatology activity and complete the homunculus joint exam.  Investigation: No additional findings.  Imaging: No results found.  Recent Labs: Lab Results  Component Value Date   WBC 5.8 05/16/2019   HGB 13.6 05/16/2019   PLT 275 05/16/2019   NA 143 05/16/2019   K 4.5 05/16/2019   CL 105 05/16/2019   CO2 24 05/16/2019   GLUCOSE 82 05/16/2019   BUN 14 05/16/2019   CREATININE 0.72 05/16/2019   BILITOT 0.6 05/16/2019   ALKPHOS 182 (H) 05/16/2019   AST 19 05/16/2019   ALT 16 05/16/2019   PROT 7.1 05/16/2019   ALBUMIN 4.6 05/16/2019   CALCIUM 9.6 05/16/2019   GFRAA 110 05/16/2019    Speciality Comments: No specialty comments available.  Procedures:  Large Joint Inj: R glenohumeral on 11/11/2020 2:08 PM Indications: pain Details: 27 G 1.5 in needle, posterior approach  Arthrogram: No  Medications: 1.5 mL lidocaine 1 %; 40 mg triamcinolone acetonide 40 MG/ML Aspirate: 0 mL Outcome: tolerated well, no immediate complications Procedure, treatment alternatives, risks and benefits explained, specific risks  discussed. Consent was given by the patient. Immediately prior to procedure a time out was called to verify the correct patient, procedure, equipment, support staff and site/side marked as required. Patient was prepped and draped in the usual sterile fashion.     Allergies: Z-pak [azithromycin], Amoxicillin-pot clavulanate, and Sulfa antibiotics   Assessment / Plan:     Visit Diagnoses: Myofascial pain syndrome: She has intermittent myalgias and muscle tenderness due to underlying myofascial pain syndrome.  She continues to take turmeric, magnesium, and coenzyme Q 10.  She has been taking Robaxin 500 mg twice daily as needed and tramadol 50 mg 1 tablet by mouth daily as needed for pain relief.  We discussed the importance of regular exercise and good sleep hygiene. She was encouraged to start practicing yoga on a regular basis. She will follow up in 6 months.    Medication monitoring encounter: UDS and narcotic agreement were updated today.  She has been taking tramadol as needed for pain relief.  A refill of tramadol was sent to the pharmacy. - Plan: DRUG MONITOR, TRAMADOL,QN, URINE, DRUG MONITOR, PANEL 5, W/CONF, URINE  Chronic right shoulder pain: She presents today with discomfort in the right shoulder joint, which started in December 2021. She has been acting as the caregiver for her father who has parkinson's so she has been having to help lift him and assist him which likely has contributed to the discomfort she is experiencing. She was evaluated by Dr. Junius Roads on 10/19/20 and was diagnosed with tendonitis according to the patient.  She has been performing home exercises without any pain relief.  She has also tried applying ice and voltaren gel topically for pain relief.  She continues to take tramadol 50 mg 1 tablet daily as needed for pain relief.  She has good ROM of the right hip on exam today with tenderness posteriorly.   X-rays of the right shoulder were obtained today. She requested a right  shoulder joint cortisone injection.  She tolerated the procedure well.  Aftercare was discussed.  She was advised to notify us if her symptoms persist or worsen. She was given a handout of shoulder exercises to perform.  - Plan: XR Shoulder Right  Primary osteoarthritis of both hands: She has mild PIP and DIP thickening consistent with osteoarthritis of both hands.  No joint tenderness or inflammation noted.  Joint protection and muscle strengthening were discussed. Discussed  the importance of performing hand exercises.   Trochanteric bursitis of both hips: She has tenderness to palpation over bilateral trochanteric bursa. She was encouraged to perform stretching exercises on a daily basis.  She was given a handout of these exercises to perform.   Primary osteoarthritis of both feet: She is not experiencing any discomfort in her feet at this time.  She has intermittent discomfort on the dorsal aspect of both feet.  Discussed the importance of wearing proper fitting shoes.   Other medical conditions are listed as follows:   Plantar fasciitis: Resolved.   HLA B27 (HLA B27 positive)  Vitamin D deficiency: She is taking vitamin D 50,000 units once every 14 days.    Hyperparathyroidism ([place])  History of migraine    Orders: Orders Placed This Encounter  Procedures  . XR Shoulder Right  . DRUG MONITOR, TRAMADOL,QN, URINE  . DRUG MONITOR, PANEL 5, W/CONF, URINE   Meds ordered this encounter  Medications  . traMADol (ULTRAM) 50 MG tablet    Sig: Take 1 tablet (50 mg total) by mouth daily as needed.    Dispense:  30 tablet    Refill:  0     Follow-Up Instructions: Return in about 6 months (around 05/13/2021) for Myofascial syndrome, Osteoarthritis.   Ofilia Neas, PA-C  Note - This record has been created using Dragon software.  Chart creation errors have been sought, but may not always  have been located. Such creation errors do not reflect on  the standard of medical care.

## 2020-11-11 ENCOUNTER — Other Ambulatory Visit: Payer: Self-pay

## 2020-11-11 ENCOUNTER — Ambulatory Visit (INDEPENDENT_AMBULATORY_CARE_PROVIDER_SITE_OTHER): Payer: 59 | Admitting: Physician Assistant

## 2020-11-11 ENCOUNTER — Ambulatory Visit: Payer: Self-pay

## 2020-11-11 ENCOUNTER — Encounter: Payer: Self-pay | Admitting: Physician Assistant

## 2020-11-11 VITALS — BP 133/77 | HR 72 | Resp 16 | Ht 69.0 in | Wt 218.6 lb

## 2020-11-11 DIAGNOSIS — Z1589 Genetic susceptibility to other disease: Secondary | ICD-10-CM

## 2020-11-11 DIAGNOSIS — G8929 Other chronic pain: Secondary | ICD-10-CM

## 2020-11-11 DIAGNOSIS — M7061 Trochanteric bursitis, right hip: Secondary | ICD-10-CM | POA: Diagnosis not present

## 2020-11-11 DIAGNOSIS — M7918 Myalgia, other site: Secondary | ICD-10-CM

## 2020-11-11 DIAGNOSIS — E213 Hyperparathyroidism, unspecified: Secondary | ICD-10-CM

## 2020-11-11 DIAGNOSIS — M7062 Trochanteric bursitis, left hip: Secondary | ICD-10-CM

## 2020-11-11 DIAGNOSIS — M19041 Primary osteoarthritis, right hand: Secondary | ICD-10-CM

## 2020-11-11 DIAGNOSIS — M25511 Pain in right shoulder: Secondary | ICD-10-CM | POA: Diagnosis not present

## 2020-11-11 DIAGNOSIS — M19072 Primary osteoarthritis, left ankle and foot: Secondary | ICD-10-CM

## 2020-11-11 DIAGNOSIS — Z8669 Personal history of other diseases of the nervous system and sense organs: Secondary | ICD-10-CM

## 2020-11-11 DIAGNOSIS — Z5181 Encounter for therapeutic drug level monitoring: Secondary | ICD-10-CM

## 2020-11-11 DIAGNOSIS — E559 Vitamin D deficiency, unspecified: Secondary | ICD-10-CM

## 2020-11-11 DIAGNOSIS — M19071 Primary osteoarthritis, right ankle and foot: Secondary | ICD-10-CM

## 2020-11-11 DIAGNOSIS — M722 Plantar fascial fibromatosis: Secondary | ICD-10-CM

## 2020-11-11 DIAGNOSIS — M19042 Primary osteoarthritis, left hand: Secondary | ICD-10-CM

## 2020-11-11 MED ORDER — TRAMADOL HCL 50 MG PO TABS: 50.0000 mg | ORAL_TABLET | Freq: Every day | ORAL | 0 refills | Status: DC | PRN

## 2020-11-11 MED ORDER — LIDOCAINE HCL 1 % IJ SOLN
1.5000 mL | INTRAMUSCULAR | Status: AC | PRN
  Administered 2020-11-11: 1.5 mL

## 2020-11-11 MED ORDER — TRIAMCINOLONE ACETONIDE 40 MG/ML IJ SUSP
40.0000 mg | INTRAMUSCULAR | Status: AC | PRN
  Administered 2020-11-11: 40 mg via INTRA_ARTICULAR

## 2020-11-11 NOTE — Patient Instructions (Addendum)
Shoulder Exercises Ask your health care provider which exercises are safe for you. Do exercises exactly as told by your health care provider and adjust them as directed. It is normal to feel mild stretching, pulling, tightness, or discomfort as you do these exercises. Stop right away if you feel sudden pain or your pain gets worse. Do not begin these exercises until told by your health care provider. Stretching exercises External rotation and abduction This exercise is sometimes called corner stretch. This exercise rotates your arm outward (external rotation) and moves your arm out from your body (abduction). 1. Stand in a doorway with one of your feet slightly in front of the other. This is called a staggered stance. If you cannot reach your forearms to the door frame, stand facing a corner of a room. 2. Choose one of the following positions as told by your health care provider: ? Place your hands and forearms on the door frame above your head. ? Place your hands and forearms on the door frame at the height of your head. ? Place your hands on the door frame at the height of your elbows. 3. Slowly move your weight onto your front foot until you feel a stretch across your chest and in the front of your shoulders. Keep your head and chest upright and keep your abdominal muscles tight. 4. Hold for __________ seconds. 5. To release the stretch, shift your weight to your back foot. Repeat __________ times. Complete this exercise __________ times a day.   Extension, standing 1. Stand and hold a broomstick, a cane, or a similar object behind your back. ? Your hands should be a little wider than shoulder width apart. ? Your palms should face away from your back. 2. Keeping your elbows straight and your shoulder muscles relaxed, move the stick away from your body until you feel a stretch in your shoulders (extension). ? Avoid shrugging your shoulders while you move the stick. Keep your shoulder blades  tucked down toward the middle of your back. 3. Hold for __________ seconds. 4. Slowly return to the starting position. Repeat __________ times. Complete this exercise __________ times a day. Range-of-motion exercises Pendulum 1. Stand near a wall or a surface that you can hold onto for balance. 2. Bend at the waist and let your left / right arm hang straight down. Use your other arm to support you. Keep your back straight and do not lock your knees. 3. Relax your left / right arm and shoulder muscles, and move your hips and your trunk so your left / right arm swings freely. Your arm should swing because of the motion of your body, not because you are using your arm or shoulder muscles. 4. Keep moving your hips and trunk so your arm swings in the following directions, as told by your health care provider: ? Side to side. ? Forward and backward. ? In clockwise and counterclockwise circles. 5. Continue each motion for __________ seconds, or for as long as told by your health care provider. 6. Slowly return to the starting position. Repeat __________ times. Complete this exercise __________ times a day.   Shoulder flexion, standing 1. Stand and hold a broomstick, a cane, or a similar object. Place your hands a little more than shoulder width apart on the object. Your left / right hand should be palm up, and your other hand should be palm down. 2. Keep your elbow straight and your shoulder muscles relaxed. Push the stick up with your healthy   arm to raise your left / right arm in front of your body, and then over your head until you feel a stretch in your shoulder (flexion). ? Avoid shrugging your shoulder while you raise your arm. Keep your shoulder blade tucked down toward the middle of your back. 3. Hold for __________ seconds. 4. Slowly return to the starting position. Repeat __________ times. Complete this exercise __________ times a day.   Shoulder abduction, standing 1. Stand and hold a  broomstick, a cane, or a similar object. Place your hands a little more than shoulder width apart on the object. Your left / right hand should be palm up, and your other hand should be palm down. 2. Keep your elbow straight and your shoulder muscles relaxed. Push the object across your body toward your left / right side. Raise your left / right arm to the side of your body (abduction) until you feel a stretch in your shoulder. ? Do not raise your arm above shoulder height unless your health care provider tells you to do that. ? If directed, raise your arm over your head. ? Avoid shrugging your shoulder while you raise your arm. Keep your shoulder blade tucked down toward the middle of your back. 3. Hold for __________ seconds. 4. Slowly return to the starting position. Repeat __________ times. Complete this exercise __________ times a day. Internal rotation 1. Place your left / right hand behind your back, palm up. 2. Use your other hand to dangle an exercise band, a towel, or a similar object over your shoulder. Grasp the band with your left / right hand so you are holding on to both ends. 3. Gently pull up on the band until you feel a stretch in the front of your left / right shoulder. The movement of your arm toward the center of your body is called internal rotation. ? Avoid shrugging your shoulder while you raise your arm. Keep your shoulder blade tucked down toward the middle of your back. 4. Hold for __________ seconds. 5. Release the stretch by letting go of the band and lowering your hands. Repeat __________ times. Complete this exercise __________ times a day.   Strengthening exercises External rotation 1. Sit in a stable chair without armrests. 2. Secure an exercise band to a stable object at elbow height on your left / right side. 3. Place a soft object, such as a folded towel or a small pillow, between your left / right upper arm and your body to move your elbow about 4 inches (10  cm) away from your side. 4. Hold the end of the exercise band so it is tight and there is no slack. 5. Keeping your elbow pressed against the soft object, slowly move your forearm out, away from your abdomen (external rotation). Keep your body steady so only your forearm moves. 6. Hold for __________ seconds. 7. Slowly return to the starting position. Repeat __________ times. Complete this exercise __________ times a day.   Shoulder abduction 1. Sit in a stable chair without armrests, or stand up. 2. Hold a __________ weight in your left / right hand, or hold an exercise band with both hands. 3. Start with your arms straight down and your left / right palm facing in, toward your body. 4. Slowly lift your left / right hand out to your side (abduction). Do not lift your hand above shoulder height unless your health care provider tells you that this is safe. ? Keep your arms straight. ? Avoid   shrugging your shoulder while you do this movement. Keep your shoulder blade tucked down toward the middle of your back. 5. Hold for __________ seconds. 6. Slowly lower your arm, and return to the starting position. Repeat __________ times. Complete this exercise __________ times a day.   Shoulder extension 1. Sit in a stable chair without armrests, or stand up. 2. Secure an exercise band to a stable object in front of you so it is at shoulder height. 3. Hold one end of the exercise band in each hand. Your palms should face each other. 4. Straighten your elbows and lift your hands up to shoulder height. 5. Step back, away from the secured end of the exercise band, until the band is tight and there is no slack. 6. Squeeze your shoulder blades together as you pull your hands down to the sides of your thighs (extension). Stop when your hands are straight down by your sides. Do not let your hands go behind your body. 7. Hold for __________ seconds. 8. Slowly return to the starting position. Repeat __________  times. Complete this exercise __________ times a day. Shoulder row 1. Sit in a stable chair without armrests, or stand up. 2. Secure an exercise band to a stable object in front of you so it is at waist height. 3. Hold one end of the exercise band in each hand. Position your palms so that your thumbs are facing the ceiling (neutral position). 4. Bend each of your elbows to a 90-degree angle (right angle) and keep your upper arms at your sides. 5. Step back until the band is tight and there is no slack. 6. Slowly pull your elbows back behind you. 7. Hold for __________ seconds. 8. Slowly return to the starting position. Repeat __________ times. Complete this exercise __________ times a day. Shoulder press-ups 1. Sit in a stable chair that has armrests. Sit upright, with your feet flat on the floor. 2. Put your hands on the armrests so your elbows are bent and your fingers are pointing forward. Your hands should be about even with the sides of your body. 3. Push down on the armrests and use your arms to lift yourself off the chair. Straighten your elbows and lift yourself up as much as you comfortably can. ? Move your shoulder blades down, and avoid letting your shoulders move up toward your ears. ? Keep your feet on the ground. As you get stronger, your feet should support less of your body weight as you lift yourself up. 4. Hold for __________ seconds. 5. Slowly lower yourself back into the chair. Repeat __________ times. Complete this exercise __________ times a day.   Wall push-ups 1. Stand so you are facing a stable wall. Your feet should be about one arm-length away from the wall. 2. Lean forward and place your palms on the wall at shoulder height. 3. Keep your feet flat on the floor as you bend your elbows and lean forward toward the wall. 4. Hold for __________ seconds. 5. Straighten your elbows to push yourself back to the starting position. Repeat __________ times. Complete this  exercise __________ times a day.   This information is not intended to replace advice given to you by your health care provider. Make sure you discuss any questions you have with your health care provider. Document Revised: 11/01/2018 Document Reviewed: 08/09/2018 [employer] Patient Education  2021 [employer]  Hip Bursitis Rehab Ask your health care provider which exercises are safe for you. Do exercises exactly  as told by your health care provider and adjust them as directed. It is normal to feel mild stretching, pulling, tightness, or discomfort as you do these exercises. Stop right away if you feel sudden pain or your pain gets worse. Do not begin these exercises until told by your health care provider. Stretching exercise This exercise warms up your muscles and joints and improves the movement and flexibility of your hip. This exercise also helps to relieve pain and stiffness. Iliotibial band stretch An iliotibial band is a strong band of muscle tissue that runs from the outer side of your hip to the outer side of your thigh and knee. 1. Lie on your side with your left / right leg in the top position. 2. Bend your left / right knee and grab your ankle. Stretch out your bottom arm to help you balance. 3. Slowly bring your knee back so your thigh is behind your body. 4. Slowly lower your knee toward the floor until you feel a gentle stretch on the outside of your left / right thigh. If you do not feel a stretch and your knee will not fall farther, place the heel of your other foot on top of your knee and pull your knee down toward the floor with your foot. 5. Hold this position for __________ seconds. 6. Slowly return to the starting position. Repeat __________ times. Complete this exercise __________ times a day.   Strengthening exercises These exercises build strength and endurance in your hip and pelvis. Endurance is the ability to use your muscles for a long time, even after they get  tired. Bridge This exercise strengthens the muscles that move your thigh backward (hip extensors). 1. Lie on your back on a firm surface with your knees bent and your feet flat on the floor. 2. Tighten your buttocks muscles and lift your buttocks off the floor until your trunk is level with your thighs. ? Do not arch your back. ? You should feel the muscles working in your buttocks and the back of your thighs. If you do not feel these muscles, slide your feet 1-2 inches (2.5-5 cm) farther away from your buttocks. ? If this exercise is too easy, try doing it with your arms crossed over your chest. 3. Hold this position for __________ seconds. 4. Slowly lower your hips to the starting position. 5. Let your muscles relax completely after each repetition. Repeat __________ times. Complete this exercise __________ times a day.   Squats This exercise strengthens the muscles in front of your thigh and knee (quadriceps). 1. Stand in front of a table, with your feet and knees pointing straight ahead. You may rest your hands on the table for balance but not for support. 2. Slowly bend your knees and lower your hips like you are going to sit in a chair. ? Keep your weight over your heels, not over your toes. ? Keep your lower legs upright so they are parallel with the table legs. ? Do not let your hips go lower than your knees. ? Do not bend lower than told by your health care provider. ? If your hip pain increases, do not bend as low. 3. Hold the squat position for __________ seconds. 4. Slowly push with your legs to return to standing. Do not use your hands to pull yourself to standing. Repeat __________ times. Complete this exercise __________ times a day. Hip hike 1. Stand sideways on a bottom step. Stand on your left / right leg with   your other foot unsupported next to the step. You can hold on to the railing or wall for balance if needed. 2. Keep your knees straight and your torso square. Then  lift your left / right hip up toward the ceiling. 3. Hold this position for __________ seconds. 4. Slowly let your left / right hip lower toward the floor, past the starting position. Your foot should get closer to the floor. Do not lean or bend your knees. Repeat __________ times. Complete this exercise __________ times a day. Single leg stand 1. Without shoes, stand near a railing or in a doorway. You may hold on to the railing or door frame as needed for balance. 2. Squeeze your left / right buttock muscles, then lift up your other foot. ? Do not let your left / right hip push out to the side. ? It is helpful to stand in front of a mirror for this exercise so you can watch your hip. 3. Hold this position for __________ seconds. Repeat __________ times. Complete this exercise __________ times a day. This information is not intended to replace advice given to you by your health care provider. Make sure you discuss any questions you have with your health care provider. Document Revised: 11/04/2018 Document Reviewed: 11/04/2018 [employer] Patient Education  2021 [employer]  

## 2020-11-13 LAB — DRUG MONITOR, PANEL 5, W/CONF, URINE
Amphetamines: NEGATIVE ng/mL (ref <500)
Barbiturates: NEGATIVE ng/mL (ref <300)
Benzodiazepines: NEGATIVE ng/mL (ref <100)
Cocaine Metabolite: NEGATIVE ng/mL (ref <150)
Creatinine: 89.4 mg/dL
Marijuana Metabolite: NEGATIVE ng/mL (ref <20)
Methadone Metabolite: NEGATIVE ng/mL (ref <100)
Opiates: NEGATIVE ng/mL (ref <100)
Oxidant: NEGATIVE ug/mL
Oxycodone: NEGATIVE ng/mL (ref <100)
pH: 5.6 (ref 4.5–9.0)

## 2020-11-13 LAB — DRUG MONITOR, TRAMADOL,QN, URINE
Desmethyltramadol: NEGATIVE ng/mL (ref <100)
Tramadol: NEGATIVE ng/mL (ref <100)

## 2020-11-13 LAB — DM TEMPLATE

## 2020-11-15 NOTE — Progress Notes (Signed)
UDS is negative for tramadol.  She takes tramadol very sparingly. Please clarify if she needs a refill of tramadol.

## 2021-01-03 ENCOUNTER — Encounter: Payer: Self-pay | Admitting: Family Medicine

## 2021-01-27 ENCOUNTER — Other Ambulatory Visit: Payer: Self-pay | Admitting: Physician Assistant

## 2021-01-27 NOTE — Telephone Encounter (Signed)
Last Visit: 11/11/2020 Next Visit: 05/12/2021 UDS:11/11/2020 Narc Agreement: 11/11/2020  Last Fill: 11/11/2020 tramadol 50 mg 1 tablet by mouth daily as needed for pain relief  Okay to refill Tramadol?

## 2021-03-16 ENCOUNTER — Encounter: Payer: Self-pay | Admitting: Family Medicine

## 2021-03-16 DIAGNOSIS — M79601 Pain in right arm: Secondary | ICD-10-CM

## 2021-03-16 NOTE — Telephone Encounter (Signed)
I would recommend follow up with ortho.  She may need to establish care with a new orthopedist at [facility] if Dr. Prince Rome is no longer available.

## 2021-03-21 ENCOUNTER — Other Ambulatory Visit: Payer: Self-pay | Admitting: Rheumatology

## 2021-03-22 NOTE — Telephone Encounter (Signed)
Last Visit: 11/11/2020  Next Visit: 05/12/2021  UDS:11/11/2020  Narc Agreement: 11/11/2020   Last Fill: 01/27/2021 tramadol 50 mg 1 tablet by mouth daily as needed for pain relief   Okay to refill Tramadol?

## 2021-04-05 ENCOUNTER — Ambulatory Visit: Payer: 59 | Admitting: Physical Therapy

## 2021-04-05 ENCOUNTER — Other Ambulatory Visit: Payer: Self-pay

## 2021-04-05 DIAGNOSIS — M6281 Muscle weakness (generalized): Secondary | ICD-10-CM | POA: Diagnosis not present

## 2021-04-05 DIAGNOSIS — M25621 Stiffness of right elbow, not elsewhere classified: Secondary | ICD-10-CM

## 2021-04-05 DIAGNOSIS — M79601 Pain in right arm: Secondary | ICD-10-CM | POA: Diagnosis not present

## 2021-04-05 NOTE — Patient Instructions (Signed)
Access Code: [email or web address] URL: [email or web address] Date: 04/05/2021 Prepared by: Narda Amber  Exercises Corner Pec Major Stretch - 2 x daily - 7 x weekly - 5 reps - 20 seconds hold Standing Row with Anchored Resistance - 2 x daily - 7 x weekly - 2 sets - 10 reps - 3 seconds hold Seated Wrist Flexion Active Stretch Pronated with Elbow Straight - 2 x daily - 7 x weekly - 5 reps - 10 seconds hold Seated Wrist Extension Stretch - 2 x daily - 7 x weekly - 5 reps - 10 seconds hold Supine Shoulder Flexion Extension AAROM with Dowel - 2 x daily - 7 x weekly - 10 reps - 3 seconds hold

## 2021-04-05 NOTE — Therapy (Signed)
[facility] [address] [place], [place], [address] Phone: [phone]   Fax:  [phone]  Physical Therapy Evaluation  Patient Details  Name: Brianna [name] MRN: [id number] Date of Birth: 07-31-1965 Referring Provider (PT): Eunice Blase, MD   Encounter Date: 04/05/2021   PT End of Session - 04/05/21 1442     Visit Number 1    Number of Visits 8    Date for PT Re-Evaluation 06/03/21    Authorization Type UHC    PT Start Time 1430    PT Stop Time 1510    PT Time Calculation (min) 40 min    Activity Tolerance Patient tolerated treatment well    Behavior During Therapy [facility] for tasks assessed/performed             Past Medical History:  Diagnosis Date   Bursitis of hip    bilateral   Deviated nasal septum    Factor V Leiden ([place])    HPV test positive 9/13   neg Pap, +16/18 HPV   Mitral valve prolapse    Molluscum contagiosum    Myofascial pain 2013   Dr. Estanislado Pandy   Obesity    Plantar fasciitis    Tendonitis of elbow, right     Past Surgical History:  Procedure Laterality Date   [place] EXTRACTION      There were no vitals filed for this visit.    Subjective Assessment - 04/05/21 1435     Subjective Pt arriving to therapy reporting 6/10 pain in her right arm. Pt reporting taking advil prior to therapy and states "today is actually a good day". Pt reproting onset of right arm that began about 6 month ago.    Pertinent History bursitis of hip, mitral valvew prolapse, plantar fascitis, tendonitis right elbow, obesity    Limitations Lifting;House hold activities    Patient Stated Goals Stop hurting, be able to work and do household chores without pain    Currently in Pain? Yes    Pain Score 6     Pain Location Arm    Pain Orientation Right    Pain Descriptors / Indicators Throbbing;Aching    Pain Type Chronic pain    Pain Onset More than a  month ago    Pain Frequency Intermittent    Aggravating Factors  musing up, lifting, reaching, household chores    Pain Relieving Factors taking prescription pain meds for hip bursitis helps her arm too                [facility] PT Assessment - 04/05/21 0001       Assessment   Medical Diagnosis M79.601 Right arm pain    Referring Provider (PT) Eunice Blase, MD    Hand Dominance Right    Prior Therapy no      Precautions   Precautions None      Restrictions   Weight Bearing Restrictions No      Balance Screen   Has the patient fallen in the past 6 months No      [place] residence    Type of [place] to enter    Entrance Stairs-Number of Steps 6    Entrance Stairs-Rails Can reach both      Prior Function   Level of Independence Independent    Vocation Full time employment  Vocation Requirements sitting at computer    Leisure 5 dogs and parrot keep me busy      Cognition   Overall Cognitive Status Within Functional Limits for tasks assessed      Observation/Other Assessments   Focus on Therapeutic Outcomes (FOTO)  48 (predicted 63)      Posture/Postural Control   Posture/Postural Control Postural limitations      ROM / Strength   AROM / PROM / Strength AROM;Strength      AROM   Overall AROM  Deficits    AROM Assessment Site Shoulder    Right/Left Shoulder Right;Left    Right Shoulder Extension 40 Degrees    Right Shoulder Flexion 145 Degrees    Right Shoulder ABduction 150 Degrees    Right Shoulder Internal Rotation 72 Degrees   shoulder abd 90 degrees   Right Shoulder External Rotation 60 Degrees   shoulder abd 90 degrees   Left Shoulder Extension 50 Degrees    Left Shoulder Flexion 160 Degrees    Left Shoulder ABduction 158 Degrees    Left Shoulder Internal Rotation 70 Degrees   shoulder abd 90 degrees   Left Shoulder External Rotation 85 Degrees   shoulder abd 90 degrees     Strength    Overall Strength Comments left UE 5/5    Strength Assessment Site Shoulder;Hand    Right/Left Shoulder Right    Right Shoulder Flexion 5/5    Right Shoulder Extension 4/5    Right Shoulder ABduction 4/5    Right Shoulder Internal Rotation 4-/5   increase pain   Right Shoulder External Rotation 4/5    Right/Left hand Right;Left    Right Hand Grip (lbs) 57.4    Left Hand Grip (lbs) 53.7      Palpation   Palpation comment TTP along right forarm                        Objective measurements completed on examination: See above findings.                PT Education - 04/05/21 1441     Education Details PT POC, HEP, home/office erogonomic set up    [employer]) Educated Patient    Methods Explanation;Demonstration;Verbal cues;Tactile cues;Handout    Comprehension Returned demonstration;Verbalized understanding              PT Short Term Goals - 04/05/21 1442       PT SHORT TERM GOAL #1   Title Pt will be independent in her intiial HEP.    Time 3    Period Weeks    Status New    Target Date 05/20/21               PT Long Term Goals - 04/05/21 1609       PT LONG TERM GOAL #1   Title Pt will be independent in her advanced HEP.    Time 8    Status New    Target Date 06/03/21      PT LONG TERM GOAL #2   Title Pt will be able to improve right shoulder strength to grossly 5/5 in order to improve ADL's.    Time 8    Period Weeks    Status New      PT LONG TERM GOAL #3   Title Pt will be able to improve her right grip strength to >/= 67 ppsi.    Time 8    Period  Weeks    Status New    Target Date 06/03/21      PT LONG TERM GOAL #4   Title Pt will be able to report pain in right arm of </= 2/10 when working a full day.    Time 8    Period Weeks    Status New    Target Date 06/03/21      PT LONG TERM GOAL #5   Title Pt will improve her FOTO score from 48% to >/= 68 %.    Time 8    Period Weeks    Status New    Target Date  06/03/21                    Plan - 04/05/21 1559     Clinical Impression Statement Pt arriving to therapy today for evaluation of chronic  right arm pain. Pt reporting pain in right forearm extended into right anterior shoulder. Pt with mild limitations in right shoulder weakness and decreased ROM when compared to left. Pt reporting pain vaires depending on her activities. Pt stating her prescription meds for her hip bursitis helps her arm.Pt was edu in HEP and proper ergonomics at her desk and at home when working on computer.  Pt could benefit from skilled PT to progress toward more painfree functional mobility with the below interventions.    Personal Factors and Comorbidities Comorbidity 3+    Comorbidities bursitis of hip, mitral valvew prolapse, plantar fascitis, tendonitis right elbow,    Examination-Activity Limitations Lift;Dressing;Other    Examination-Participation Restrictions Community Activity;Laundry;Occupation;Other    Stability/Clinical Decision Making Stable/Uncomplicated    Clinical Decision Making Low    Rehab Potential Good    PT Frequency 1x / week    PT Duration 8 weeks    PT Treatment/Interventions ADLs/Self Care Home Management;Cryotherapy;Electrical Stimulation;Iontophoresis 4mg /ml Dexamethasone;Moist Heat;Ultrasound;Therapeutic exercise;Therapeutic activities;Functional mobility training;Neuromuscular re-education;Patient/family education;Manual techniques;Dry needling;Taping;Passive range of motion    PT Next Visit Plan shoulder stretching, shoulder ROM, forearm exercises, Manual therapy as needed, assess for DN if appropriate    PT Home Exercise Plan Access Code: [email or web address]    Consulted and Agree with Plan of Care Patient             Patient will benefit from skilled therapeutic intervention in order to improve the following deficits and impairments:  Obesity, Pain, Decreased strength, Postural dysfunction, Improper body mechanics, Impaired UE  functional use, Decreased range of motion  Visit Diagnosis: Pain in right arm  Stiffness of right upper arm joint  Muscle weakness (generalized)     Problem List Patient Active Problem List   Diagnosis Date Noted   Cracked lips 07/04/2020   Deviated septum 04/08/2019   Chronic nasal congestion 04/08/2019   Hypertrophy of both inferior nasal turbinates 04/08/2019   Myofascial pain syndrome 08/17/2016   HLA B27 (HLA B27 positive) 08/17/2016   Trochanteric bursitis of both hips 08/17/2016   History of migraine 08/17/2016   Primary osteoarthritis of both hands 08/17/2016   Primary osteoarthritis of both feet 08/17/2016   Plantar fasciitis 08/17/2016   Hyperparathyroidism ([place]) 09/20/2015   High risk HPV infection 05/19/2013    Oretha Caprice, PT, MPT 04/05/2021, [facility] [address] [place], [place], [address] Phone: [phone]   Fax:  [phone]  Name: Brianna [name] MRN: [id number] Date of Birth: Nov 10, 1965

## 2021-04-15 ENCOUNTER — Other Ambulatory Visit: Payer: Self-pay | Admitting: Family Medicine

## 2021-04-15 DIAGNOSIS — Z1231 Encounter for screening mammogram for malignant neoplasm of breast: Secondary | ICD-10-CM

## 2021-04-18 ENCOUNTER — Encounter: Payer: Self-pay | Admitting: Physical Therapy

## 2021-04-18 ENCOUNTER — Other Ambulatory Visit: Payer: Self-pay

## 2021-04-18 ENCOUNTER — Ambulatory Visit: Payer: 59 | Admitting: Physical Therapy

## 2021-04-18 DIAGNOSIS — M6281 Muscle weakness (generalized): Secondary | ICD-10-CM | POA: Diagnosis not present

## 2021-04-18 DIAGNOSIS — M79601 Pain in right arm: Secondary | ICD-10-CM | POA: Diagnosis not present

## 2021-04-18 DIAGNOSIS — M25621 Stiffness of right elbow, not elsewhere classified: Secondary | ICD-10-CM | POA: Diagnosis not present

## 2021-04-18 NOTE — Therapy (Signed)
[facility] [address] [place], [place], [address] Phone: [phone]   Fax:  [phone]  Physical Therapy Treatment  Patient Details  Name: Brianna [name] MRN: [id number] Date of Birth: 11/30/1965 Referring Provider (PT): Lavada Mesi, MD   Encounter Date: 04/18/2021   PT End of Session - 04/18/21 1357     Visit Number 2    Number of Visits 8    Date for PT Re-Evaluation 06/03/21    Authorization Type UHC    PT Start Time 1350    PT Stop Time 1421    PT Time Calculation (min) 31 min    Activity Tolerance Patient tolerated treatment well    Behavior During Therapy [facility] for tasks assessed/performed             Past Medical History:  Diagnosis Date   Bursitis of hip    bilateral   Deviated nasal septum    Factor V Leiden ([facility])    HPV test positive 9/13   neg Pap, +16/18 HPV   Mitral valve prolapse    Molluscum contagiosum    Myofascial pain 2013   Dr. Corliss Skains   Obesity    Plantar fasciitis    Tendonitis of elbow, right     Past Surgical History:  Procedure Laterality Date   CESAREAN SECTION  1997, 2000   TEMPOROMANDIBULAR JOINT SURGERY  1988   WISDOM TOOTH EXTRACTION      There were no vitals filed for this visit.   Subjective Assessment - 04/18/21 1356     Subjective Pt arriving today reporting 6/10 pain in her right arm. Pt reporting compliance with her HEP with no questions.    Pertinent History bursitis of hip, mitral valvew prolapse, plantar fascitis, tendonitis right elbow, obesity    Patient Stated Goals Stop hurting, be able to work and do household chores without pain    Currently in Pain? Yes    Pain Score 6     Pain Location Arm    Pain Orientation Right    Pain Descriptors / Indicators Sore    Pain Type Chronic pain    Pain Onset More than a month ago                               [facility] Adult PT Treatment/Exercise - 04/18/21 0001       Exercises   Exercises  Shoulder;Other Exercises    Other Exercises  elbow pronation/supination using 1# weight x 15      Shoulder Exercises: Supine   External Rotation AAROM;Right;20 reps;Limitations    External Rotation Limitations 1# bar    Flexion AAROM;Right;20 reps;Limitations    Flexion Limitations 1# bar    ABduction AROM;Strengthening;Right;15 reps;Weights    Shoulder ABduction Weight (lbs) 1#      Shoulder Exercises: Pulleys   Flexion 3 minutes      Manual Therapy   Manual Therapy Soft tissue mobilization    Soft tissue mobilization IASTM to right forearm                       PT Short Term Goals - 04/18/21 1405       PT SHORT TERM GOAL #1   Title Pt will be independent in her intiial HEP.    Status On-going               PT Long Term Goals - 04/05/21 1609  PT LONG TERM GOAL #1   Title Pt will be independent in her advanced HEP.    Time 8    Status New    Target Date 06/03/21      PT LONG TERM GOAL #2   Title Pt will be able to improve right shoulder strength to grossly 5/5 in order to improve ADL's.    Time 8    Period Weeks    Status New      PT LONG TERM GOAL #3   Title Pt will be able to improve her right grip strength to >/= 67 ppsi.    Time 8    Period Weeks    Status New    Target Date 06/03/21      PT LONG TERM GOAL #4   Title Pt will be able to report pain in right arm of </= 2/10 when working a full day.    Time 8    Period Weeks    Status New    Target Date 06/03/21      PT LONG TERM GOAL #5   Title Pt will improve her FOTO score from 48% to >/= 68 %.    Time 8    Period Weeks    Status New    Target Date 06/03/21                   Plan - 04/18/21 1359     Clinical Impression Statement Pt tolerating exercises well today with review of pt's HEP. Pt still reproting 6/10 pain today. Pt reporting driving seems to make it worse along with working at her computer for longer periods. Pt still reproting difficutly with lifting  and reaching. Continue skilled PT to progress toward maximizing pt's function.    Personal Factors and Comorbidities Comorbidity 3+    Comorbidities bursitis of hip, mitral valvew prolapse, plantar fascitis, tendonitis right elbow,    Examination-Activity Limitations Lift;Dressing;Other    Examination-Participation Restrictions Community Activity;Laundry;Occupation;Other    Stability/Clinical Decision Making Stable/Uncomplicated    Rehab Potential Good    PT Frequency 1x / week    PT Duration 8 weeks    PT Treatment/Interventions ADLs/Self Care Home Management;Cryotherapy;Electrical Stimulation;Iontophoresis 4mg /ml Dexamethasone;Moist Heat;Ultrasound;Therapeutic exercise;Therapeutic activities;Functional mobility training;Neuromuscular re-education;Patient/family education;Manual techniques;Dry needling;Taping;Passive range of motion    PT Next Visit Plan shoulder stretching, shoulder ROM, forearm exercises, Manual therapy as needed, assess for DN if appropriate    PT Home Exercise Plan Access Code: [email or web address]    Consulted and Agree with Plan of Care Patient             Patient will benefit from skilled therapeutic intervention in order to improve the following deficits and impairments:  Obesity, Pain, Decreased strength, Postural dysfunction, Improper body mechanics, Impaired UE functional use, Decreased range of motion  Visit Diagnosis: Pain in right arm  Stiffness of right upper arm joint  Muscle weakness (generalized)     Problem List Patient Active Problem List   Diagnosis Date Noted   Cracked lips 07/04/2020   Deviated septum 04/08/2019   Chronic nasal congestion 04/08/2019   Hypertrophy of both inferior nasal turbinates 04/08/2019   Myofascial pain syndrome 08/17/2016   HLA B27 (HLA B27 positive) 08/17/2016   Trochanteric bursitis of both hips 08/17/2016   History of migraine 08/17/2016   Primary osteoarthritis of both hands 08/17/2016   Primary osteoarthritis of both  feet 08/17/2016   Plantar fasciitis 08/17/2016   Hyperparathyroidism ([place]) 09/20/2015   High risk HPV infection  05/19/2013    Oretha Caprice, PT,  MPT  04/18/2021, 2:25 PM  [facility] [address] [place], [place], [address] Phone: [phone]   Fax:  [phone]  Name: YUKIKO MINNICH MRN: [id number] Date of Birth: [birth year]

## 2021-04-25 ENCOUNTER — Encounter: Payer: 59 | Admitting: Physical Therapy

## 2021-04-28 NOTE — Progress Notes (Deleted)
Office Visit Note  Patient: Brianna [name]             Date of Birth: 11/04/1965           MRN: [id number]             PCP: Glenis Smoker, MD Referring: Glenis Smoker, * Visit Date: 05/12/2021 Occupation: @GUAROCC @  Subjective:  No chief complaint on file.   History of Present Illness: Brianna [name] is a 55 y.o. female ***   Activities of Daily Living:  Patient reports morning stiffness for *** {minute/hour:19697}.   Patient {ACTIONS;DENIES/REPORTS:21021675::"Denies"} nocturnal pain.  Difficulty dressing/grooming: {ACTIONS;DENIES/REPORTS:21021675::"Denies"} Difficulty climbing stairs: {ACTIONS;DENIES/REPORTS:21021675::"Denies"} Difficulty getting out of chair: {ACTIONS;DENIES/REPORTS:21021675::"Denies"} Difficulty using hands for taps, buttons, cutlery, and/or writing: {ACTIONS;DENIES/REPORTS:21021675::"Denies"}  No Rheumatology ROS completed.   PMFS History:  Patient Active Problem List   Diagnosis Date Noted   Cracked lips 07/04/2020   Deviated septum 04/08/2019   Chronic nasal congestion 04/08/2019   Hypertrophy of both inferior nasal turbinates 04/08/2019   Myofascial pain syndrome 08/17/2016   HLA B27 (HLA B27 positive) 08/17/2016   Trochanteric bursitis of both hips 08/17/2016   History of migraine 08/17/2016   Primary osteoarthritis of both hands 08/17/2016   Primary osteoarthritis of both feet 08/17/2016   Plantar fasciitis 08/17/2016   Hyperparathyroidism ([place]) 09/20/2015   High risk HPV infection 05/19/2013    Past Medical History:  Diagnosis Date   Bursitis of hip    bilateral   Deviated nasal septum    Factor V Leiden ([place])    HPV test positive 9/13   neg Pap, +16/18 HPV   Mitral valve prolapse    Molluscum contagiosum    Myofascial pain 2013   Dr. Estanislado Pandy   Obesity    Plantar fasciitis    Tendonitis of elbow, right     Family History  Problem Relation Age of Onset   Colon cancer Sister 58       pre-colon cancer-had  surgery   Heart disease Sister    Tyriq Moragne' disease Sister    Hashimoto's thyroiditis Sister    Diabetes Father    Hypertension Father    Hyperlipidemia Father    Parkinson's disease Father    COPD Mother    Rheum arthritis Mother    Hyperlipidemia Mother    Heart attack Mother        01/2016   Breast cancer Maternal Aunt    Hyperparathyroidism Neg Hx    Past Surgical History:  Procedure Laterality Date   CESAREAN SECTION  1997, 2000   [place] EXTRACTION     Social History   Social History Narrative   Patient is right-handed. She lives with her husband in a 2 level home. She drinks tea 1-2 x a day. And an occasional soda. She walks most days for 30 minutes.   Immunization History  Administered Date(s) Administered   Influenza,inj,Quad PF,6+ Mos 04/22/2019   Influenza-Unspecified 05/18/2020   Moderna Sars-Covid-2 Vaccination 07/29/2019, 08/26/2019   Tdap 07/30/2015   Zoster Recombinat (Shingrix) 08/24/2018     Objective: Vital Signs: LMP 12/22/2012    Physical Exam   Musculoskeletal Exam: ***  CDAI Exam: CDAI Score: -- Patient Global: --; Provider Global: -- Swollen: --; Tender: -- Joint Exam 05/12/2021   No joint exam has been documented for this visit   There is currently no information documented on the homunculus. Go to the Rheumatology activity and complete the homunculus joint  exam.  Investigation: No additional findings.  Imaging: No results found.  Recent Labs: Lab Results  Component Value Date   WBC 5.8 05/16/2019   HGB 13.6 05/16/2019   PLT 275 05/16/2019   NA 143 05/16/2019   K 4.5 05/16/2019   CL 105 05/16/2019   CO2 24 05/16/2019   GLUCOSE 82 05/16/2019   BUN 14 05/16/2019   CREATININE 0.72 05/16/2019   BILITOT 0.6 05/16/2019   ALKPHOS 182 (H) 05/16/2019   AST 19 05/16/2019   ALT 16 05/16/2019   PROT 7.1 05/16/2019   ALBUMIN 4.6 05/16/2019   CALCIUM 9.6 05/16/2019   GFRAA 110  05/16/2019    Speciality Comments: No specialty comments available.  Procedures:  No procedures performed Allergies: Z-pak [azithromycin], Amoxicillin-pot clavulanate, and Sulfa antibiotics   Assessment / Plan:     Visit Diagnoses: No diagnosis found.  Orders: No orders of the defined types were placed in this encounter.  No orders of the defined types were placed in this encounter.   Face-to-face time spent with patient was *** minutes. Greater than 50% of time was spent in counseling and coordination of care.  Follow-Up Instructions: No follow-ups on file.   Earnestine Mealing, CMA  Note - This record has been created using Editor, commissioning.  Chart creation errors have been sought, but may not always  have been located. Such creation errors do not reflect on  the standard of medical care.

## 2021-05-02 ENCOUNTER — Ambulatory Visit (INDEPENDENT_AMBULATORY_CARE_PROVIDER_SITE_OTHER): Payer: 59 | Admitting: Physical Therapy

## 2021-05-02 ENCOUNTER — Encounter: Payer: Self-pay | Admitting: Physical Therapy

## 2021-05-02 ENCOUNTER — Other Ambulatory Visit: Payer: Self-pay

## 2021-05-02 DIAGNOSIS — M79601 Pain in right arm: Secondary | ICD-10-CM | POA: Diagnosis not present

## 2021-05-02 DIAGNOSIS — M25621 Stiffness of right elbow, not elsewhere classified: Secondary | ICD-10-CM | POA: Diagnosis not present

## 2021-05-02 DIAGNOSIS — M6281 Muscle weakness (generalized): Secondary | ICD-10-CM

## 2021-05-02 NOTE — Therapy (Addendum)
[facility] Physical Therapy [address] [place], [place], [address] Phone: [phone]   Fax:  [phone]  Physical Therapy Treatment Discharge  Patient Details  Name: DEJIA EBRON MRN: [id number] Date of Birth: 02-10-1966 Referring Provider (PT): Eunice Blase, MD   Encounter Date: 05/02/2021   PT End of Session - 05/02/21 1345     Visit Number 3    Number of Visits 8    Date for PT Re-Evaluation 06/03/21    Authorization Type U[employer]    PT Start Time 5436    PT Stop Time 0677    PT Time Calculation (min) 30 min    Activity Tolerance Patient tolerated treatment well    Behavior During Therapy [facility] for tasks assessed/performed             Past Medical History:  Diagnosis Date   Bursitis of hip    bilateral   Deviated nasal septum    Factor V Leiden ([place])    HPV test positive 9/13   neg Pap, +16/18 HPV   Mitral valve prolapse    Molluscum contagiosum    Myofascial pain 2013   Dr. Estanislado Pandy   Obesity    Plantar fasciitis    Tendonitis of elbow, right     Past Surgical History:  Procedure Laterality Date   [place] EXTRACTION      There were no vitals filed for this visit.   Subjective Assessment - 05/02/21 1344     Subjective Pt arriving today reporting 4/10 pain in her right forearm and left shoulder.    Pertinent History bursitis of hip, mitral valvew prolapse, plantar fascitis, tendonitis right elbow, obesity    Limitations Lifting;House hold activities    Patient Stated Goals Stop hurting, be able to work and do household chores without pain    Currently in Pain? Yes    Pain Score 4     Pain Location Arm    Pain Orientation Right    Pain Descriptors / Indicators Sore    Pain Type Chronic pain    Pain Onset More than a month ago                               [facility] Adult PT Treatment/Exercise - 05/02/21 0001       Exercises    Exercises Shoulder;Other Exercises    Other Exercises  elbow pronation/supination using 2# weight x 15      Shoulder Exercises: Supine   External Rotation AAROM;Right;20 reps;Limitations    External Rotation Limitations 2# bar    Flexion AAROM;Right;20 reps;Limitations    Flexion Limitations 2# bar    ABduction AROM;Strengthening;Right;15 reps;Weights    Shoulder ABduction Weight (lbs) 2#      Shoulder Exercises: Seated   External Rotation Strengthening;Both;10 reps;Theraband   2 sets, bilateral ER pull "money" position   Theraband Level (Shoulder External Rotation) Level 2 (Red)    Other Seated Exercises cervical retraction x 10 holding 5 seconds each      Shoulder Exercises: Pulleys   Flexion 3 minutes    Scaption 3 minutes      Manual Therapy   Manual Therapy Soft tissue mobilization    Soft tissue mobilization IASTM to right forearm using biofreeze  PT Short Term Goals - 05/02/21 1355       PT SHORT TERM GOAL #1   Title Pt will be independent in her intiial HEP.    Status On-going               PT Long Term Goals - 05/02/21 1355       PT LONG TERM GOAL #1   Title Pt will be independent in her advanced HEP.    Status On-going      PT LONG TERM GOAL #2   Title Pt will be able to improve right shoulder strength to grossly 5/5 in order to improve ADL's.    Status On-going      PT LONG TERM GOAL #3   Title Pt will be able to improve her right grip strength to >/= 67 ppsi.    Status On-going      PT LONG TERM GOAL #4   Title Pt will be able to report pain in right arm of </= 2/10 when working a full day.    Status On-going      PT LONG TERM GOAL #5   Title Pt will improve her FOTO score from 48% to >/= 68 %.    Status On-going                   Plan - 05/02/21 1350     Clinical Impression Statement Pt tolerating exercises reporting mild burning sensation in right elbow with flexion/extension exercises. Pt  reporting driving is better but long distance driving she notices a "bid difference". Exercises focused on overall shoulder and elbow ROM and gentle strengthening. Continue skilled PT to maximize pt's function.    Personal Factors and Comorbidities Comorbidity 3+    Comorbidities bursitis of hip, mitral valvew prolapse, plantar fascitis, tendonitis right elbow,    Examination-Activity Limitations Lift;Dressing;Other    Examination-Participation Restrictions Community Activity;Laundry;Occupation;Other    Stability/Clinical Decision Making Stable/Uncomplicated    Rehab Potential Good    PT Frequency 1x / week    PT Duration 8 weeks    PT Treatment/Interventions ADLs/Self Care Home Management;Cryotherapy;Electrical Stimulation;Iontophoresis 4mg /ml Dexamethasone;Moist Heat;Ultrasound;Therapeutic exercise;Therapeutic activities;Functional mobility training;Neuromuscular re-education;Patient/family education;Manual techniques;Dry needling;Taping;Passive range of motion    PT Next Visit Plan continue shoulder stretching, shoulder ROM, forearm exercises, Manual therapy as needed, assess for DN if appropriate    PT Home Exercise Plan Access Code: [email or web address]    Consulted and Agree with Plan of Care Patient             Patient will benefit from skilled therapeutic intervention in order to improve the following deficits and impairments:  Obesity, Pain, Decreased strength, Postural dysfunction, Improper body mechanics, Impaired UE functional use, Decreased range of motion  Visit Diagnosis: Pain in right arm  Stiffness of right upper arm joint  Muscle weakness (generalized)     Problem List Patient Active Problem List   Diagnosis Date Noted   Cracked lips 07/04/2020   Deviated septum 04/08/2019   Chronic nasal congestion 04/08/2019   Hypertrophy of both inferior nasal turbinates 04/08/2019   Myofascial pain syndrome 08/17/2016   HLA B27 (HLA B27 positive) 08/17/2016   Trochanteric bursitis  of both hips 08/17/2016   History of migraine 08/17/2016   Primary osteoarthritis of both hands 08/17/2016   Primary osteoarthritis of both feet 08/17/2016   Plantar fasciitis 08/17/2016   Hyperparathyroidism ([place]) 09/20/2015   High risk HPV infection 05/19/2013    Oretha Caprice, PT, MPT 05/02/2021, 2:27 PM  [facility] Physical Therapy [address] [place], [place], [address] Phone: [phone]   Fax:  [phone]  Name: NIEMA CARRARA MRN: [id number] Date of Birth: 1965-08-03  PHYSICAL THERAPY DISCHARGE SUMMARY  Visits from Start of Care: 3  Current functional level related to goals / functional outcomes: See above   Remaining deficits: See above   Education / Equipment: HEP   Patient agrees to discharge. Patient goals were partially met. Patient is being discharged due to not returning since the last visit.  Kearney Hard, PT, MPT 07/19/21 9:14 AM

## 2021-05-09 ENCOUNTER — Telehealth: Payer: 59 | Admitting: Family Medicine

## 2021-05-09 ENCOUNTER — Encounter: Payer: 59 | Admitting: Physical Therapy

## 2021-05-09 DIAGNOSIS — J069 Acute upper respiratory infection, unspecified: Secondary | ICD-10-CM | POA: Diagnosis not present

## 2021-05-09 MED ORDER — PROMETHAZINE-DM 6.25-15 MG/5ML PO SYRP: 2.5000 mL | ORAL_SOLUTION | Freq: Three times a day (TID) | ORAL | 0 refills | Status: DC | PRN

## 2021-05-09 MED ORDER — DOXYCYCLINE HYCLATE 100 MG PO TABS: 100.0000 mg | ORAL_TABLET | Freq: Two times a day (BID) | ORAL | 0 refills | Status: AC

## 2021-05-09 NOTE — Progress Notes (Signed)
E-Visit for Sinus Problems  We are sorry that you are not feeling well.  Here is how we plan to help!  Based on what you have shared with me it looks like you have sinusitis.  Sinusitis is inflammation and infection in the sinus cavities of the head.  Based on your presentation I believe you most likely have Acute Bacterial Sinusitis.  This is an infection caused by bacteria and is treated with antibiotics. I have prescribed Doxycycline 100mg  by mouth twice a day for 10 days. You may use an oral decongestant such as Mucinex D or if you have glaucoma or high blood pressure use plain Mucinex. Saline nasal spray help and can safely be used as often as needed for congestion.  If you develop worsening sinus pain, fever or notice severe headache and vision changes, or if symptoms are not better after completion of antibiotic, please schedule an appointment with a health care provider.    And a cough syrup as been ordered- please read ingredients on all medications OTC so that you do not double the dosage on anything. This medication might make you sleepy.   Sinus infections are not as easily transmitted as other respiratory infection, however we still recommend that you avoid close contact with loved ones, especially the very young and elderly.  Remember to wash your hands thoroughly throughout the day as this is the number one way to prevent the spread of infection!  Home Care: Only take medications as instructed by your medical team. Complete the entire course of an antibiotic. Do not take these medications with alcohol. A steam or ultrasonic humidifier can help congestion.  You can place a towel over your head and breathe in the steam from hot water coming from a faucet. Avoid close contacts especially the very young and the elderly. Cover your mouth when you cough or sneeze. Always remember to wash your hands.  Get Help Right Away If: You develop worsening fever or sinus pain. You develop a severe  head ache or visual changes. Your symptoms persist after you have completed your treatment plan.  Make sure you Understand these instructions. Will watch your condition. Will get help right away if you are not doing well or get worse.  Thank you for choosing an e-visit.  Your e-visit answers were reviewed by a board certified advanced clinical practitioner to complete your personal care plan. Depending upon the condition, your plan could have included both over the counter or prescription medications.  Please review your pharmacy choice. Make sure the pharmacy is open so you can pick up prescription now. If there is a problem, you may contact your provider through and have the prescription routed to another pharmacy.  Your safety is important to [employer]. If you have drug allergies check your prescription carefully.   For the next 24 hours you can use MyChart to ask questions about today's visit, request a non-urgent call back, or ask for a work or school excuse. You will get an email in the next two days asking about your experience. I hope that your e-visit has been valuable and will speed your recovery.   I provided 5 minutes of non face-to-face time during this encounter for chart review, medication and order placement, as well as and documentation.

## 2021-05-11 NOTE — Progress Notes (Deleted)
Office Visit Note  Patient: Brianna [name]             Date of Birth: [birth year]           MRN: [id number]             PCP: Glenis Smoker, MD Referring: Glenis Smoker, * Visit Date: 05/16/2021 Occupation: @GUAROCC @  Subjective:    History of Present Illness: Brianna [name] is a 55 y.o. female with history of myofascial pain and osteoarthritis.  She takes methocarbamol 500 mg BID PRN and tramadol 50 mg 1 tablet daily as needed for pain relief.   Activities of Daily Living:  Patient reports morning stiffness for *** {minute/hour:19697}.   Patient {ACTIONS;DENIES/REPORTS:21021675::"Denies"} nocturnal pain.  Difficulty dressing/grooming: {ACTIONS;DENIES/REPORTS:21021675::"Denies"} Difficulty climbing stairs: {ACTIONS;DENIES/REPORTS:21021675::"Denies"} Difficulty getting out of chair: {ACTIONS;DENIES/REPORTS:21021675::"Denies"} Difficulty using hands for taps, buttons, cutlery, and/or writing: {ACTIONS;DENIES/REPORTS:21021675::"Denies"}  No Rheumatology ROS completed.   PMFS History:  Patient Active Problem List   Diagnosis Date Noted   Cracked lips 07/04/2020   Deviated septum 04/08/2019   Chronic nasal congestion 04/08/2019   Hypertrophy of both inferior nasal turbinates 04/08/2019   Myofascial pain syndrome 08/17/2016   HLA B27 (HLA B27 positive) 08/17/2016   Trochanteric bursitis of both hips 08/17/2016   History of migraine 08/17/2016   Primary osteoarthritis of both hands 08/17/2016   Primary osteoarthritis of both feet 08/17/2016   Plantar fasciitis 08/17/2016   Hyperparathyroidism ([place]) 09/20/2015   High risk HPV infection 05/19/2013    Past Medical History:  Diagnosis Date   Bursitis of hip    bilateral   Deviated nasal septum    Factor V Leiden ([place])    HPV test positive 9/13   neg Pap, +16/18 HPV   Mitral valve prolapse    Molluscum contagiosum    Myofascial pain 2013   Dr. Estanislado Pandy   Obesity    Plantar fasciitis    Tendonitis of  elbow, right     Family History  Problem Relation Age of Onset   Colon cancer Sister 20       pre-colon cancer-had surgery   Heart disease Sister    Graves' disease Sister    Hashimoto's thyroiditis Sister    Diabetes Father    Hypertension Father    Hyperlipidemia Father    Parkinson's disease Father    COPD Mother    Rheum arthritis Mother    Hyperlipidemia Mother    Heart attack Mother        01/2016   Breast cancer Maternal Aunt    Hyperparathyroidism Neg Hx    Past Surgical History:  Procedure Laterality Date   [place]ARE[place] SECTION  1997, 2000   [place] EXTRACTION     Social History   Social History Narrative   Patient is right-handed. She lives with her husband in a 2 level home. She drinks tea 1-2 x a day. And an occasional soda. She walks most days for 30 minutes.   Immunization History  Administered Date(s) Administered   Influenza,inj,Quad PF,6+ Mos 04/22/2019   Influenza-Unspecified 05/18/2020   Moderna Sars-Covid-2 Vaccination 07/29/2019, 08/26/2019   Tdap 07/30/2015   Zoster Recombinat (Shingrix) 08/24/2018     Objective: Vital Signs: LMP 12/22/2012    Physical Exam Vitals and nursing note reviewed.  Constitutional:      Appearance: She is well-developed.  HENT:     Head: Normocephalic and atraumatic.  Eyes:     Conjunctiva/sclera: Conjunctivae normal.  Pulmonary:     Effort: Pulmonary effort is normal.  Abdominal:     Palpations: Abdomen is soft.  Musculoskeletal:     Cervical back: Normal range of motion.  Skin:    General: Skin is warm and dry.     Capillary Refill: Capillary refill takes less than 2 seconds.  Neurological:     Mental Status: She is alert and oriented to person, place, and time.  Psychiatric:        Behavior: Behavior normal.     Musculoskeletal Exam: ***  CDAI Exam: CDAI Score: -- Patient Global: --; Provider Global: -- Swollen: --; Tender: -- Joint Exam 05/16/2021   No  joint exam has been documented for this visit   There is currently no information documented on the homunculus. Go to the Rheumatology activity and complete the homunculus joint exam.  Investigation: No additional findings.  Imaging: No results found.  Recent Labs: Lab Results  Component Value Date   WBC 5.8 05/16/2019   HGB 13.6 05/16/2019   PLT 275 05/16/2019   NA 143 05/16/2019   K 4.5 05/16/2019   CL 105 05/16/2019   CO2 24 05/16/2019   GLUCOSE 82 05/16/2019   BUN 14 05/16/2019   CREATININE 0.72 05/16/2019   BILITOT 0.6 05/16/2019   ALKPHOS 182 (H) 05/16/2019   AST 19 05/16/2019   ALT 16 05/16/2019   PROT 7.1 05/16/2019   ALBUMIN 4.6 05/16/2019   CALCIUM 9.6 05/16/2019   GFRAA 110 05/16/2019    Speciality Comments: No specialty comments available.  Procedures:  No procedures performed Allergies: Z-pak [azithromycin], Amoxicillin-pot clavulanate, and Sulfa antibiotics   Assessment / Plan:     Visit Diagnoses: Myofascial pain syndrome  Medication monitoring encounter  Primary osteoarthritis of both hands  Trochanteric bursitis of both hips  Primary osteoarthritis of both feet  Plantar fasciitis  HLA B27 (HLA B27 positive)  Vitamin D deficiency  Hyperparathyroidism ([place])  History of migraine  Orders: No orders of the defined types were placed in this encounter.  No orders of the defined types were placed in this encounter.    Follow-Up Instructions: No follow-ups on file.   Ofilia Neas, PA-C  Note - This record has been created using Dragon software.  Chart creation errors have been sought, but may not always  have been located. Such creation errors do not reflect on  the standard of medical care.

## 2021-05-12 ENCOUNTER — Ambulatory Visit: Payer: 59 | Admitting: Rheumatology

## 2021-05-12 DIAGNOSIS — M7918 Myalgia, other site: Secondary | ICD-10-CM

## 2021-05-12 DIAGNOSIS — Z5181 Encounter for therapeutic drug level monitoring: Secondary | ICD-10-CM

## 2021-05-12 DIAGNOSIS — Z8669 Personal history of other diseases of the nervous system and sense organs: Secondary | ICD-10-CM

## 2021-05-12 DIAGNOSIS — E559 Vitamin D deficiency, unspecified: Secondary | ICD-10-CM

## 2021-05-12 DIAGNOSIS — M7062 Trochanteric bursitis, left hip: Secondary | ICD-10-CM

## 2021-05-12 DIAGNOSIS — Z1589 Genetic susceptibility to other disease: Secondary | ICD-10-CM

## 2021-05-12 DIAGNOSIS — M19072 Primary osteoarthritis, left ankle and foot: Secondary | ICD-10-CM

## 2021-05-12 DIAGNOSIS — M722 Plantar fascial fibromatosis: Secondary | ICD-10-CM

## 2021-05-12 DIAGNOSIS — M19041 Primary osteoarthritis, right hand: Secondary | ICD-10-CM

## 2021-05-12 DIAGNOSIS — E213 Hyperparathyroidism, unspecified: Secondary | ICD-10-CM

## 2021-05-16 ENCOUNTER — Ambulatory Visit: Payer: 59 | Admitting: Physician Assistant

## 2021-05-16 DIAGNOSIS — E213 Hyperparathyroidism, unspecified: Secondary | ICD-10-CM

## 2021-05-16 DIAGNOSIS — M7061 Trochanteric bursitis, right hip: Secondary | ICD-10-CM

## 2021-05-16 DIAGNOSIS — E559 Vitamin D deficiency, unspecified: Secondary | ICD-10-CM

## 2021-05-16 DIAGNOSIS — Z5181 Encounter for therapeutic drug level monitoring: Secondary | ICD-10-CM

## 2021-05-16 DIAGNOSIS — M19041 Primary osteoarthritis, right hand: Secondary | ICD-10-CM

## 2021-05-16 DIAGNOSIS — M7918 Myalgia, other site: Secondary | ICD-10-CM

## 2021-05-16 DIAGNOSIS — M722 Plantar fascial fibromatosis: Secondary | ICD-10-CM

## 2021-05-16 DIAGNOSIS — Z1589 Genetic susceptibility to other disease: Secondary | ICD-10-CM

## 2021-05-16 DIAGNOSIS — M19072 Primary osteoarthritis, left ankle and foot: Secondary | ICD-10-CM

## 2021-05-16 DIAGNOSIS — Z8669 Personal history of other diseases of the nervous system and sense organs: Secondary | ICD-10-CM

## 2021-05-16 NOTE — Progress Notes (Signed)
Office Visit Note  Patient: Brianna [name]             Date of Birth: 09-20-1965           MRN: [id number]             PCP: Glenis Smoker, MD Referring: Glenis Smoker, * Visit Date: 05/17/2021 Occupation: _0 @  Subjective:  Generalized pain   History of Present Illness: Brianna [name] is a 55 y.o. female with history of myofascial pain and osteoarthritis.  Patient reports that she continues to have generalized myalgias and muscle tenderness due to myofascial pain.  She states that she has been having more frequent flares and has had interrupted sleep at night due to the severity of her pain.  Her discomfort is typically most severe in her neck, shoulders, and both hips.  She has nocturnal pain in both hips due to trochanter bursitis bilaterally.  She tries to perform stretching exercises on a daily basis.  She is also been walking daily for exercise.  She denies any plantar fasciitis at this time.  She states that with the cooler weather she has been experiencing increased generalized stiffness.  She takes tramadol very sparingly for pain relief which typically takes the "edge off."  She occasionally takes Tylenol or Advil as needed for pain relief instead of taking tramadol.  She remains on turmeric, tart cherry, magnesium, and coenzyme Q10.  She takes methocarbamol 500 mg twice daily as needed for muscle spasms.   Activities of Daily Living:  Patient reports morning stiffness for several hours.   Patient Reports nocturnal pain.  Difficulty dressing/grooming: Denies Difficulty climbing stairs: Denies Difficulty getting out of chair: Denies Difficulty using hands for taps, buttons, cutlery, and/or writing: Denies  Review of Systems  Constitutional:  Positive for fatigue.  HENT:  Positive for nose dryness. Negative for mouth sores and mouth dryness.   Eyes:  Negative for pain, itching and dryness.  Respiratory:  Negative for shortness of breath and difficulty  breathing.   Cardiovascular:  Positive for palpitations. Negative for chest pain.  Gastrointestinal:  Negative for blood in stool, constipation and diarrhea.  Endocrine: Negative for increased urination.  Genitourinary:  Negative for difficulty urinating.  Musculoskeletal:  Positive for joint pain, joint pain, joint swelling, myalgias, morning stiffness, muscle tenderness and myalgias.  Skin:  Negative for color change, rash and redness.  Allergic/Immunologic: Negative for susceptible to infections.  Neurological:  Positive for headaches and weakness. Negative for dizziness, numbness and memory loss.  Hematological:  Positive for bruising/bleeding tendency.  Psychiatric/Behavioral:  Negative for confusion.    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 Estanislado Pandy   Obesity    Plantar fasciitis    Tendonitis of elbow, right     Family History  Problem Relation Age of Onset   Colon cancer Sister 71       pre-colon cancer-had surgery   Heart disease Sister    Berenice Primas' disease Sister    Hashimoto's thyroiditis Sister    Diabetes Father    Hypertension Father    Hyperlipidemia Father     Parkinson's disease Father    COPD Mother    Rheum arthritis Mother    Hyperlipidemia Mother    Heart attack Mother        01/2016   Breast cancer Maternal Aunt    Hyperparathyroidism Neg Hx    Past Surgical History:  Procedure Laterality Date   CESAREAN SECTION  1997, 2000   [place] EXTRACTION     Social History   Social History Narrative   Patient is right-handed. She lives with her husband in a 2 level home. She drinks tea 1-2 x a day. And an occasional soda. She walks most days for 30 minutes.   Immunization History  Administered Date(s) Administered   Influenza,inj,Quad PF,6+ Mos 04/22/2019   Influenza-Unspecified 05/18/2020   Moderna Sars-Covid-2 Vaccination 07/29/2019, 08/26/2019, 06/29/2020   Pfizer Covid-19 Vaccine Bivalent Booster 60yr & up 04/07/2021   Tdap 07/30/2015   Zoster Recombinat (Shingrix) 08/24/2018     Objective: Vital Signs: BP 112/78 (BP Location: Left Arm, Patient Position: Sitting, Cuff Size: Large)   Pulse (!) 55   Ht _0  (1.753 m)   Wt 222 lb 9.6 oz (101 kg)   LMP 12/22/2012   BMI 32.87 kg/m    Physical Exam Vitals and nursing note reviewed.  Constitutional:      Appearance: She is well-developed.  HENT:     Head: Normocephalic and atraumatic.  Eyes:     Conjunctiva/sclera: Conjunctivae normal.  Pulmonary:     Effort: Pulmonary effort is normal.  Abdominal:     Palpations: Abdomen is soft.  Musculoskeletal:     Cervical back: Normal range of motion.  Skin:    General: Skin is warm and dry.     Capillary Refill: Capillary refill takes less than 2 seconds.  Neurological:     Mental Status: She is alert and oriented to person, place, and time.  Psychiatric:        Behavior: Behavior normal.     Musculoskeletal Exam: Generalized hyperalgesia and positive tender points on examination today.  C-spine has painful range of motion with lateral rotation.  Trapezius muscle tension and  tenderness bilaterally.  Shoulder joints have good range of motion with some tenderness over the deltoid insertion site bilaterally.  Elbow joints, wrist joints, MCPs, PIPs, DIPs have good range of motion with no synovitis.  She was able to make a complete fist bilaterally.  Hip joints have good range of motion with no groin pain.  Knee joints have good range of motion with no warmth or effusion.  Ankle joints have good range of motion with no tenderness or joint swelling.  CDAI Exam: CDAI Score: -- Patient Global: --; Provider Global: -- Swollen: --; Tender: -- Joint Exam 05/17/2021   No joint exam has been documented for this visit   There is currently no information documented on the homunculus. Go to the Rheumatology activity and complete the homunculus joint exam.  Investigation: No additional findings.  Imaging:  No results found.  Recent Labs: Lab Results  Component Value Date   WBC 5.8 05/16/2019   HGB 13.6 05/16/2019   PLT 275 05/16/2019   NA 143 05/16/2019   K 4.5 05/16/2019   CL 105 05/16/2019   CO2 24 05/16/2019   GLUCOSE 82 05/16/2019   BUN 14 05/16/2019   CREATININE 0.72 05/16/2019   BILITOT 0.6 05/16/2019   ALKPHOS 182 (H) 05/16/2019   AST 19 05/16/2019   ALT 16 05/16/2019   PROT 7.1 05/16/2019   ALBUMIN 4.6 05/16/2019   CALCIUM 9.6 05/16/2019   GFRAA 110 05/16/2019    Speciality Comments: No specialty comments available.  Procedures:  No procedures performed Allergies: Z-pak [azithromycin], Amoxicillin-pot clavulanate, and Sulfa antibiotics   Assessment / Plan:     Visit Diagnoses: Myofascial pain syndrome: She has generalized hyperalgesia and positive tender points on examination.  She has been experiencing increased generalized myalgias, muscle tenderness, and stiffness with the cooler weather temperatures.  She is currently experiencing trapezius muscle tension and tenderness bilaterally.  She experiences muscle spasms on occasion and takes  methocarbamol 500 mg twice daily as needed.  She has been experiencing an increased frequency of flares which she attributes to colder weather temperatures as well as increased rest.  She has been trying to perform resistive exercises and has been walking on a daily basis for exercise.  She is currently experiencing discomfort due to trochanter bursitis of both hips.  We discussed the importance of performing stretching exercises on a daily basis.  She was given a handout of these exercises to perform.  I also discussed a referral to physical therapy but she declined at this time.  I also discussed water aerobics or water therapy in the future if she continues to have recurrent flares.  Discussed the importance of regular exercise and good sleep hygiene.  Several her sleep hygiene habits were discussed today in detail.  She plans on trying to drink sleepy time tea at night.  She plans on continuing to take magnesium, coenzyme Q 10, turmeric, tart cherry, and adding in ginger and omega-3.  She will follow up in the office in 6 months.   Trapezius muscle spasm: She has trapezius muscle tension and tenderness bilaterally.  She has been experiencing muscle spasms intermittently.  She takes methocarbamol 500 mg twice daily as needed for muscle spasms.  Discussed scheduling a myofascial release to try to alleviate some of her discomfort.  Also discussed the use of warm moist heat.  Primary osteoarthritis of both hands: No tenderness or inflammation was noted on examination today.  She was able to make a complete fist bilaterally.  Discussed the importance of joint protection and muscle strengthening. Discussed the list of natural anti-inflammatories.   Trochanteric bursitis of both hips: She has good range of motion of both hip joints with no groin pain.  Tenderness over bilateral trochanteric bursa noted.  Discussed the importance of performing stretching exercises on a daily basis.  She was given a handout of  these exercises to perform.  She declined referral to physical therapy at this time.  Primary osteoarthritis of both feet: She has not been experiencing any increased discomfort in her feet recently.  She has good range of motion of both ankle joints with no tenderness or joint swelling.  Discussed the importance of wearing proper fitting shoes.  Plantar fasciitis: Resolved.  Discussed the importance of wearing proper fitting shoes.  Medication monitoring encounter -UDS and narcotic agreement were updated today  on 05/17/2021.  She takes tramadol very sparingly for pain relief.  She has only taken 1 dose of tramadol this week and did not take any doses last week.  She tries to take Tylenol or Advil if she is experiencing mild to moderate pain.  Plan: DRUG MONITOR, TRAMADOL,QN, URINE, DRUG MONITOR, PANEL 5, W/CONF, URINE  Other medical conditions are listed as follows:  HLA B27 (HLA B27 positive): No clinical features of spondyloarthropathy at this time.  Vitamin D deficiency: She takes vitamin D 50,000 units every 14 days.  Hyperparathyroidism ([place])  History of migraine  Orders: Orders Placed This Encounter  Procedures   DRUG MONITOR, TRAMADOL,QN, URINE   DRUG MONITOR, PANEL 5, W/CONF, URINE    No orders of the defined types were placed in this encounter.    Follow-Up Instructions: Return in about 6 months (around 11/15/2021) for Myofascial pain, Osteoarthritis.   Ofilia Neas, PA-C  Note - This record has been created using Dragon software.  Chart creation errors have been sought, but may not always  have been located. Such creation errors do not reflect on  the standard of medical care.

## 2021-05-17 ENCOUNTER — Encounter: Payer: Self-pay | Admitting: Physician Assistant

## 2021-05-17 ENCOUNTER — Ambulatory Visit: Payer: 59 | Admitting: Physician Assistant

## 2021-05-17 ENCOUNTER — Other Ambulatory Visit: Payer: Self-pay

## 2021-05-17 VITALS — BP 112/78 | HR 55 | Ht 69.0 in | Wt 222.6 lb

## 2021-05-17 DIAGNOSIS — M7918 Myalgia, other site: Secondary | ICD-10-CM | POA: Diagnosis not present

## 2021-05-17 DIAGNOSIS — E213 Hyperparathyroidism, unspecified: Secondary | ICD-10-CM

## 2021-05-17 DIAGNOSIS — M7062 Trochanteric bursitis, left hip: Secondary | ICD-10-CM

## 2021-05-17 DIAGNOSIS — M7061 Trochanteric bursitis, right hip: Secondary | ICD-10-CM | POA: Diagnosis not present

## 2021-05-17 DIAGNOSIS — E559 Vitamin D deficiency, unspecified: Secondary | ICD-10-CM

## 2021-05-17 DIAGNOSIS — M722 Plantar fascial fibromatosis: Secondary | ICD-10-CM

## 2021-05-17 DIAGNOSIS — M19072 Primary osteoarthritis, left ankle and foot: Secondary | ICD-10-CM

## 2021-05-17 DIAGNOSIS — Z5181 Encounter for therapeutic drug level monitoring: Secondary | ICD-10-CM

## 2021-05-17 DIAGNOSIS — M62838 Other muscle spasm: Secondary | ICD-10-CM

## 2021-05-17 DIAGNOSIS — M19042 Primary osteoarthritis, left hand: Secondary | ICD-10-CM

## 2021-05-17 DIAGNOSIS — M19041 Primary osteoarthritis, right hand: Secondary | ICD-10-CM | POA: Diagnosis not present

## 2021-05-17 DIAGNOSIS — Z1589 Genetic susceptibility to other disease: Secondary | ICD-10-CM

## 2021-05-17 DIAGNOSIS — Z8669 Personal history of other diseases of the nervous system and sense organs: Secondary | ICD-10-CM

## 2021-05-17 DIAGNOSIS — M19071 Primary osteoarthritis, right ankle and foot: Secondary | ICD-10-CM

## 2021-05-17 NOTE — Patient Instructions (Signed)
Hip Bursitis Rehab Ask your health care provider which exercises are safe for you. Do exercises exactly as told by your health care provider and adjust them as directed. It is normal to feel mild stretching, pulling, tightness, or discomfort as you do these exercises. Stop right away if you feel sudden pain or your pain gets worse. Do not begin these exercises until told by your health care provider. Stretching exercise This exercise warms up your muscles and joints and improves the movement and flexibility of your hip. This exercise also helps to relieve pain and stiffness. Iliotibial band stretch An iliotibial band is a strong band of muscle tissue that runs from the outer side of your hip to the outer side of your thigh and knee. Lie on your side with your left / right leg in the top position. Bend your left / right knee and grab your ankle. Stretch out your bottom arm to help you balance. Slowly bring your knee back so your thigh is behind your body. Slowly lower your knee toward the floor until you feel a gentle stretch on the outside of your left / right thigh. If you do not feel a stretch and your knee will not fall farther, place the heel of your other foot on top of your knee and pull your knee down toward the floor with your foot. Hold this position for __________ seconds. Slowly return to the starting position. Repeat __________ times. Complete this exercise __________ times a day. Strengthening exercises These exercises build strength and endurance in your hip and pelvis. Endurance is the ability to use your muscles for a long time, even after they get tired. Bridge This exercise strengthens the muscles that move your thigh backward (hip extensors). Lie on your back on a firm surface with your knees bent and your feet flat on the floor. Tighten your buttocks muscles and lift your buttocks off the floor until your trunk is level with your thighs. Do not arch your back. You should feel  the muscles working in your buttocks and the back of your thighs. If you do not feel these muscles, slide your feet 1-2 inches (2.5-5 cm) farther away from your buttocks. If this exercise is too easy, try doing it with your arms crossed over your chest. Hold this position for __________ seconds. Slowly lower your hips to the starting position. Let your muscles relax completely after each repetition. Repeat __________ times. Complete this exercise __________ times a day. Squats This exercise strengthens the muscles in front of your thigh and knee (quadriceps). Stand in front of a table, with your feet and knees pointing straight ahead. You may rest your hands on the table for balance but not for support. Slowly bend your knees and lower your hips like you are going to sit in a chair. Keep your weight over your heels, not over your toes. Keep your lower legs upright so they are parallel with the table legs. Do not let your hips go lower than your knees. Do not bend lower than told by your health care provider. If your hip pain increases, do not bend as low. Hold the squat position for __________ seconds. Slowly push with your legs to return to standing. Do not use your hands to pull yourself to standing. Repeat __________ times. Complete this exercise __________ times a day. Hip hike Stand sideways on a bottom step. Stand on your left / right leg with your other foot unsupported next to the step. You can hold   on to the railing or wall for balance if needed. Keep your knees straight and your torso square. Then lift your left / right hip up toward the ceiling. Hold this position for __________ seconds. Slowly let your left / right hip lower toward the floor, past the starting position. Your foot should get closer to the floor. Do not lean or bend your knees. Repeat __________ times. Complete this exercise __________ times a day. Single leg stand Without shoes, stand near a railing or in a  doorway. You may hold on to the railing or door frame as needed for balance. Squeeze your left / right buttock muscles, then lift up your other foot. Do not let your left / right hip push out to the side. It is helpful to stand in front of a mirror for this exercise so you can watch your hip. Hold this position for __________ seconds. Repeat __________ times. Complete this exercise __________ times a day. This information is not intended to replace advice given to you by your health care provider. Make sure you discuss any questions you have with your health care provider. Document Revised: 11/04/2018 Document Reviewed: 11/04/2018 Elsevier Patient Education  2022 [employer]   Iliotibial Band Syndrome Rehab Ask your health care provider which exercises are safe for you. Do exercises exactly as told by your health care provider and adjust them as directed. It is normal to feel mild stretching, pulling, tightness, or discomfort as you do these exercises. Stop right away if you feel sudden pain or your pain gets significantly worse. Do not begin these exercises until told by your health care provider. Stretching and range-of-motion exercises These exercises warm up your muscles and joints and improve the movement and flexibility of your hip and pelvis. Quadriceps stretch, prone  Lie on your abdomen (prone position) on a firm surface, such as a bed or padded floor. Bend your left / right knee and reach back to hold your ankle or pant leg. If you cannot reach your ankle or pant leg, loop a belt around your foot and grab the belt instead. Gently pull your heel toward your buttocks. Your knee should not slide out to the side. You should feel a stretch in the front of your thigh and knee (quadriceps). Hold this position for __________ seconds. Repeat __________ times. Complete this exercise __________ times a day. Iliotibial band stretch An iliotibial band is a strong band of muscle tissue that  runs from the outer side of your hip to the outer side of your thigh and knee. Lie on your side with your left / right leg in the top position. Bend both of your knees and grab your left / right ankle. Stretch out your bottom arm to help you balance. Slowly bring your top knee back so your thigh goes behind your trunk. Slowly lower your top leg toward the floor until you feel a gentle stretch on the outside of your left / right hip and thigh. If you do not feel a stretch and your knee will not fall farther, place the heel of your other foot on top of your knee and pull your knee down toward the floor with your foot. Hold this position for __________ seconds. Repeat __________ times. Complete this exercise __________ times a day. Strengthening exercises These exercises build strength and endurance in your hip and pelvis. Endurance is the ability to use your muscles for a long time, even after they get tired. Straight leg raises, side-lying This exercise   strengthens the muscles that rotate the leg at the hip and move it away from your body (hip abductors). Lie on your side with your left / right leg in the top position. Lie so your head, shoulder, hip, and knee line up. You may bend your bottom knee to help you balance. Roll your hips slightly forward so your hips are stacked directly over each other and your left / right knee is facing forward. Tense the muscles in your outer thigh and lift your top leg 4-6 inches (10-15 cm). Hold this position for __________ seconds. Slowly lower your leg to return to the starting position. Let your muscles relax completely before doing another repetition. Repeat __________ times. Complete this exercise __________ times a day. Leg raises, prone This exercise strengthens the muscles that move the hips backward (hip extensors). Lie on your abdomen (prone position) on your bed or a firm surface. You can put a pillow under your hips if that is more comfortable for  your lower back. Bend your left / right knee so your foot is straight up in the air. Squeeze your buttocks muscles and lift your left / right thigh off the bed. Do not let your back arch. Tense your thigh muscle as hard as you can without increasing any knee pain. Hold this position for __________ seconds. Slowly lower your leg to return to the starting position and allow it to relax completely. Repeat __________ times. Complete this exercise __________ times a day. Hip hike Stand sideways on a bottom step. Stand on your left / right leg with your other foot unsupported next to the step. You can hold on to a railing or wall for balance if needed. Keep your knees straight and your torso square. Then lift your left / right hip up toward the ceiling. Slowly let your left / right hip lower toward the floor, past the starting position. Your foot should get closer to the floor. Do not lean or bend your knees. Repeat __________ times. Complete this exercise __________ times a day. This information is not intended to replace advice given to you by your health care provider. Make sure you discuss any questions you have with your health care provider. Document Revised: 09/17/2019 Document Reviewed: 09/17/2019 Elsevier Patient Education  2022 [employer]  

## 2021-05-19 ENCOUNTER — Ambulatory Visit (HOSPITAL_BASED_OUTPATIENT_CLINIC_OR_DEPARTMENT_OTHER): Payer: 59 | Admitting: Obstetrics & Gynecology

## 2021-05-19 LAB — DRUG MONITOR, TRAMADOL,QN, URINE
Desmethyltramadol: 102 ng/mL — ABNORMAL HIGH (ref <100)
Tramadol: 175 ng/mL — ABNORMAL HIGH (ref <100)

## 2021-05-19 LAB — DRUG MONITOR, PANEL 5, W/CONF, URINE
Amphetamines: NEGATIVE ng/mL (ref <500)
Barbiturates: NEGATIVE ng/mL (ref <300)
Benzodiazepines: NEGATIVE ng/mL (ref <100)
Cocaine Metabolite: NEGATIVE ng/mL (ref <150)
Creatinine: 74.3 mg/dL (ref >=20.0)
Marijuana Metabolite: NEGATIVE ng/mL (ref <20)
Methadone Metabolite: NEGATIVE ng/mL (ref <100)
Opiates: NEGATIVE ng/mL (ref <100)
Oxidant: NEGATIVE ug/mL (ref <200)
Oxycodone: NEGATIVE ng/mL (ref <100)
pH: 5.5 (ref 4.5–9.0)

## 2021-05-19 LAB — DM TEMPLATE

## 2021-05-19 NOTE — Progress Notes (Signed)
UDS is consistent with treatment.

## 2021-05-25 ENCOUNTER — Ambulatory Visit
Admission: RE | Admit: 2021-05-25 | Discharge: 2021-05-25 | Disposition: A | Discharge status: Home / Self Care | Payer: 59 | Source: Ambulatory Visit | Attending: Family Medicine | Admitting: Family Medicine

## 2021-05-25 DIAGNOSIS — Z1231 Encounter for screening mammogram for malignant neoplasm of breast: Secondary | ICD-10-CM

## 2021-06-08 ENCOUNTER — Other Ambulatory Visit: Payer: Self-pay | Admitting: Physician Assistant

## 2021-06-08 MED ORDER — METHOCARBAMOL 500 MG PO TABS: ORAL_TABLET | ORAL | 3 refills | Status: DC

## 2021-06-08 NOTE — Telephone Encounter (Signed)
Next Visit: 11/15/2021  Last Visit: 05/17/2021  UDS:05/17/2021   Narc Agreement: 05/17/2021  Last Fill: 03/22/2020 methocarbamol 500 mg twice daily as needed. 03/22/2021 tramadol very sparingly for pain relief.   Okay to refill Methocarbamol and tramadol?

## 2021-06-08 NOTE — Telephone Encounter (Deleted)
Next Visit: 11/15/2021  Last Visit: 05/17/2021  UDS:05/17/2021, UDS is consistent with treatment.   Narc Agreement: 05/17/2021  Last Fill: 03/22/2021  Okay to refill tramadol?

## 2021-06-20 ENCOUNTER — Ambulatory Visit (INDEPENDENT_AMBULATORY_CARE_PROVIDER_SITE_OTHER): Payer: 59 | Admitting: Obstetrics & Gynecology

## 2021-06-20 ENCOUNTER — Other Ambulatory Visit (HOSPITAL_COMMUNITY)
Admission: RE | Admit: 2021-06-20 | Discharge: 2021-06-20 | Disposition: A | Discharge status: Home / Self Care | Payer: 59 | Source: Ambulatory Visit | Attending: Obstetrics & Gynecology | Admitting: Obstetrics & Gynecology

## 2021-06-20 ENCOUNTER — Encounter (HOSPITAL_BASED_OUTPATIENT_CLINIC_OR_DEPARTMENT_OTHER): Payer: Self-pay | Admitting: Obstetrics & Gynecology

## 2021-06-20 ENCOUNTER — Other Ambulatory Visit: Payer: Self-pay

## 2021-06-20 ENCOUNTER — Other Ambulatory Visit (HOSPITAL_BASED_OUTPATIENT_CLINIC_OR_DEPARTMENT_OTHER): Payer: Self-pay

## 2021-06-20 VITALS — BP 128/72 | HR 61 | Ht 69.0 in | Wt 221.6 lb

## 2021-06-20 DIAGNOSIS — Z01419 Encounter for gynecological examination (general) (routine) without abnormal findings: Secondary | ICD-10-CM | POA: Diagnosis not present

## 2021-06-20 DIAGNOSIS — Z124 Encounter for screening for malignant neoplasm of cervix: Secondary | ICD-10-CM

## 2021-06-20 DIAGNOSIS — E559 Vitamin D deficiency, unspecified: Secondary | ICD-10-CM | POA: Diagnosis not present

## 2021-06-20 DIAGNOSIS — Z114 Encounter for screening for human immunodeficiency virus [HIV]: Secondary | ICD-10-CM

## 2021-06-20 DIAGNOSIS — Z0000 Encounter for general adult medical examination without abnormal findings: Secondary | ICD-10-CM

## 2021-06-20 DIAGNOSIS — Z83719 Family history of colon polyps, unspecified: Secondary | ICD-10-CM

## 2021-06-20 DIAGNOSIS — B977 Papillomavirus as the cause of diseases classified elsewhere: Secondary | ICD-10-CM

## 2021-06-20 DIAGNOSIS — D6851 Activated protein C resistance: Secondary | ICD-10-CM

## 2021-06-20 DIAGNOSIS — Z8371 Family history of colonic polyps: Secondary | ICD-10-CM

## 2021-06-20 DIAGNOSIS — M7918 Myalgia, other site: Secondary | ICD-10-CM

## 2021-06-20 MED ORDER — ZOSTER VAC RECOMB ADJUVANTED 50 MCG/0.5ML IM SUSR
INTRAMUSCULAR | 0 refills | Status: DC
  Filled 2021-06-20: qty 0.5, 1d supply, fill #0

## 2021-06-20 MED ORDER — VITAMIN D (ERGOCALCIFEROL) 1.25 MG (50000 UNIT) PO CAPS: 50000.0000 [IU] | ORAL_CAPSULE | ORAL | 4 refills | Status: DC

## 2021-06-20 NOTE — Progress Notes (Signed)
55 y.o. [id number] Married White or Caucasian female here for annual exam.  Lots of stressors.  Father diagnosed with Parkinson about 5 years ago but had really changed this past year.   Denies vaginal bleeding.    On Vit D.  Has not had blood work done in the past year.  Patient's last menstrual period was 12/22/2012.          Sexually active: Yes.    The current method of family planning is post menopausal status.    re discussed. The patient elected to proceed with No data recorded.  Exercising: Yes.     treadmill Smoker:  no  Health Maintenance: Pap:  05/17/2020 Negative History of abnormal Pap:  yes MMG:  05/25/2021 Negative Colonoscopy:  09/24/2015, follow up 7 years BMD:   none Screening Labs: done with Dr. Chanetta Marshall   reports that she has never smoked. She has never used smokeless tobacco. She reports current alcohol use. She reports that she does not use drugs.  Past Medical History:  Diagnosis Date   Bursitis of hip    bilateral   Deviated nasal septum    Factor V Leiden (HCC)    HPV test positive 9/13   neg Pap, +16/18 HPV   Mitral valve prolapse    Molluscum contagiosum    Myofascial pain 2013   Dr. Corliss Skains   Obesity    Plantar fasciitis    Tendonitis of elbow, right     Past Surgical History:  Procedure Laterality Date   CESAREAN SECTION  1997, 2000   TEMPOROMANDIBULAR JOINT SURGERY  1988   WISDOM TOOTH EXTRACTION      Current Outpatient Medications  Medication Sig Dispense Refill   azelastine (ASTELIN) 0.1 % nasal spray Place into both nostrils.     Coenzyme Q10 (CO Q 10 PO) Take by mouth daily.     cyanocobalamin 500 MCG tablet Take 500 mcg by mouth daily.     diclofenac Sodium (VOLTAREN) 1 % GEL Apply 4 g topically 4 (four) times daily as needed. 500 g 6   levocetirizine (XYZAL) 5 MG tablet SMARTSIG:1 Tablet(s) By Mouth Every Evening     Magnesium 500 MG CAPS Take by mouth daily.     methocarbamol (ROBAXIN) 500 MG tablet TAKE 1 TABLET BY MOUTH AT  7 AM AND 1 TABLET AT 2 PM AS NEEDED 60 tablet 3   Misc Natural Products (TART CHERRY ADVANCED PO) Take by mouth.     montelukast (SINGULAIR) 10 MG tablet Take 10 mg by mouth daily.     Multiple Vitamins-Minerals (MULTIVITAMIN WITH MINERALS) tablet Take 1 tablet by mouth daily.     nadolol (CORGARD) 40 MG tablet Take 40 mg by mouth daily.     omeprazole (PRILOSEC) 20 MG capsule Take 20 mg by mouth daily.     Probiotic Product (PROBIOTIC DAILY PO) Take by mouth daily.     traMADol (ULTRAM) 50 MG tablet TAKE 1 TABLET(50 MG) BY MOUTH DAILY AS NEEDED 30 tablet 0   TURMERIC PO Take 450 mg by mouth daily.     cetirizine (ZYRTEC) 10 MG tablet Take 10 mg by mouth daily. (Patient not taking: Reported on 06/20/2021)     fluticasone (FLONASE) 50 MCG/ACT nasal spray Place 2 sprays into both nostrils daily. (Patient not taking: Reported on 06/20/2021) 16 g 6   Vitamin D, Ergocalciferol, (DRISDOL) 1.25 MG (50000 UNIT) CAPS capsule Take 1 capsule (50,000 Units total) by mouth every 14 (fourteen) days. 6 capsule 4  No current facility-administered medications for this visit.    Family History  Problem Relation Age of Onset   Colon cancer Sister 21       pre-colon cancer-had surgery   Heart disease Sister    Luiz Blare' disease Sister    Hashimoto's thyroiditis Sister    Diabetes Father    Hypertension Father    Hyperlipidemia Father    Parkinson's disease Father    COPD Mother    Rheum arthritis Mother    Hyperlipidemia Mother    Heart attack Mother        01/2016   Breast cancer Maternal Aunt    Hyperparathyroidism Neg Hx     Review of Systems  Musculoskeletal:  Positive for myalgias.  All other systems reviewed and are negative.  Exam:   BP 128/72 (BP Location: Left Arm, Patient Position: Sitting, Cuff Size: Large)   Pulse 61   Ht 5\' 9"  (1.753 m) Comment: reported  Wt 221 lb 9.6 oz (100.5 kg)   LMP 12/22/2012   BMI 32.72 kg/m   Height: 5\' 9"  (175.3 cm) (reported)  General appearance:  alert, cooperative and appears stated age Head: Normocephalic, without obvious abnormality, atraumatic Neck: no adenopathy, supple, symmetrical, trachea midline and thyroid normal to inspection and palpation Lungs: clear to auscultation bilaterally Breasts: normal appearance, no masses or tenderness Heart: regular rate and rhythm Abdomen: soft, non-tender; bowel sounds normal; no masses,  no organomegaly Extremities: extremities normal, atraumatic, no cyanosis or edema Skin: Skin color, texture, turgor normal. No rashes or lesions Lymph nodes: Cervical, supraclavicular, and axillary nodes normal. No abnormal inguinal nodes palpated Neurologic: Grossly normal   Pelvic: External genitalia:  no lesions              Urethra:  normal appearing urethra with no masses, tenderness or lesions              Bartholins and Skenes: normal                 Vagina: normal appearing vagina with normal color and no discharge, no lesions              Cervix: no lesions              Pap taken: Yes.   Bimanual Exam:  Uterus:  normal size, contour, position, consistency, mobility, non-tender              Adnexa: normal adnexa and no mass, fullness, tenderness               Rectovaginal: Confirms               Anus:  normal sphincter tone, no lesions  Chaperone, 02/21/2013, CMA, was present for exam.  Assessment/Plan: 1. Well woman exam with routine gynecological exam - pap obtained today.  Had negative HR HPV last year.  Pt desires yearly pap smear. -MMG 05/2021 - colonoscopy 2017 - plan BMD around age 70 - lab work ordered today  2. Blood tests for routine general physical examination - Comprehensive metabolic panel - Hemoglobin A1c - TSH - VITAMIN D 25 Hydroxy (Vit-D Deficiency, Fractures) - CBC with Differential/Platelet - Lipid panel - HIV  3. Vitamin D deficiency - Vitamin D, Ergocalciferol, (DRISDOL) 1.25 MG (50000 UNIT) CAPS capsule; Take 1 capsule (50,000 Units total) by mouth every  14 (fourteen) days.  Dispense: 6 capsule; Refill: 4  4. 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  Follow up has been negative  5.  Family history of colonic polyps in sister  96. Myofascial pain - followed by Dr. Corliss Skains  7. Factor V Leiden (HCC)

## 2021-06-21 LAB — LIPID PANEL
Chol/HDL Ratio: 3.2 ratio (ref 0.0–4.4)
Cholesterol, Total: 219 mg/dL — ABNORMAL HIGH (ref 100–199)
HDL: 68 mg/dL (ref >39)
LDL Chol Calc (NIH): 139 mg/dL — ABNORMAL HIGH (ref 0–99)
Triglycerides: 69 mg/dL (ref 0–149)
VLDL Cholesterol Cal: 12 mg/dL (ref 5–40)

## 2021-06-21 LAB — CBC WITH DIFFERENTIAL/PLATELET
Basophils Absolute: 0 10*3/uL (ref 0.0–0.2)
Basos: 1 %
EOS (ABSOLUTE): 0.1 10*3/uL (ref 0.0–0.4)
Eos: 2 %
Hematocrit: 41.3 % (ref 34.0–46.6)
Hemoglobin: 13.7 g/dL (ref 11.1–15.9)
Immature Grans (Abs): 0 10*3/uL (ref 0.0–0.1)
Immature Granulocytes: 0 %
Lymphocytes Absolute: 1.1 10*3/uL (ref 0.7–3.1)
Lymphs: 24 %
MCH: 30.4 pg (ref 26.6–33.0)
MCHC: 33.2 g/dL (ref 31.5–35.7)
MCV: 92 fL (ref 79–97)
Monocytes Absolute: 0.3 10*3/uL (ref 0.1–0.9)
Monocytes: 7 %
Neutrophils Absolute: 3 10*3/uL (ref 1.4–7.0)
Neutrophils: 66 %
Platelets: 253 10*3/uL (ref 150–450)
RBC: 4.5 x10E6/uL (ref 3.77–5.28)
RDW: 12.8 % (ref 11.7–15.4)
WBC: 4.6 10*3/uL (ref 3.4–10.8)

## 2021-06-21 LAB — COMPREHENSIVE METABOLIC PANEL
ALT: 14 IU/L (ref 0–32)
AST: 18 IU/L (ref 0–40)
Albumin/Globulin Ratio: 2.2 (ref 1.2–2.2)
Albumin: 4.7 g/dL (ref 3.8–4.9)
Alkaline Phosphatase: 151 IU/L — ABNORMAL HIGH (ref 44–121)
BUN/Creatinine Ratio: 23 (ref 9–23)
BUN: 15 mg/dL (ref 6–24)
Bilirubin Total: 0.5 mg/dL (ref 0.0–1.2)
CO2: 23 mmol/L (ref 20–29)
Calcium: 9.5 mg/dL (ref 8.7–10.2)
Chloride: 106 mmol/L (ref 96–106)
Creatinine, Ser: 0.65 mg/dL (ref 0.57–1.00)
Globulin, Total: 2.1 g/dL (ref 1.5–4.5)
Glucose: 93 mg/dL (ref 70–99)
Potassium: 4 mmol/L (ref 3.5–5.2)
Sodium: 144 mmol/L (ref 134–144)
Total Protein: 6.8 g/dL (ref 6.0–8.5)
eGFR: 103 mL/min/{1.73_m2} (ref >59)

## 2021-06-21 LAB — TSH: TSH: 0.965 u[IU]/mL (ref 0.450–4.500)

## 2021-06-21 LAB — CYTOLOGY - PAP: Diagnosis: NEGATIVE

## 2021-06-21 LAB — HEMOGLOBIN A1C
Est. average glucose Bld gHb Est-mCnc: 103 mg/dL
Hgb A1c MFr Bld: 5.2 % (ref 4.8–5.6)

## 2021-06-21 LAB — HIV ANTIBODY (ROUTINE TESTING W REFLEX): HIV Screen 4th Generation wRfx: NONREACTIVE

## 2021-07-02 ENCOUNTER — Other Ambulatory Visit (HOSPITAL_BASED_OUTPATIENT_CLINIC_OR_DEPARTMENT_OTHER): Payer: Self-pay | Admitting: Obstetrics & Gynecology

## 2021-07-02 DIAGNOSIS — R748 Abnormal levels of other serum enzymes: Secondary | ICD-10-CM

## 2021-07-08 ENCOUNTER — Ambulatory Visit (HOSPITAL_BASED_OUTPATIENT_CLINIC_OR_DEPARTMENT_OTHER): Payer: 59

## 2021-07-12 ENCOUNTER — Other Ambulatory Visit (HOSPITAL_BASED_OUTPATIENT_CLINIC_OR_DEPARTMENT_OTHER): Payer: 59

## 2021-07-12 ENCOUNTER — Ambulatory Visit (HOSPITAL_BASED_OUTPATIENT_CLINIC_OR_DEPARTMENT_OTHER)
Admission: RE | Admit: 2021-07-12 | Discharge: 2021-07-12 | Disposition: A | Discharge status: Home / Self Care | Payer: 59 | Source: Ambulatory Visit | Attending: Obstetrics & Gynecology | Admitting: Obstetrics & Gynecology

## 2021-07-12 ENCOUNTER — Other Ambulatory Visit: Payer: Self-pay

## 2021-07-12 DIAGNOSIS — R748 Abnormal levels of other serum enzymes: Secondary | ICD-10-CM

## 2021-07-13 LAB — GAMMA GT: GGT: 20 IU/L (ref 0–60)

## 2021-07-15 ENCOUNTER — Other Ambulatory Visit (HOSPITAL_BASED_OUTPATIENT_CLINIC_OR_DEPARTMENT_OTHER): Payer: Self-pay | Admitting: Obstetrics & Gynecology

## 2021-07-15 DIAGNOSIS — K824 Cholesterolosis of gallbladder: Secondary | ICD-10-CM

## 2021-07-15 DIAGNOSIS — R748 Abnormal levels of other serum enzymes: Secondary | ICD-10-CM

## 2021-08-08 ENCOUNTER — Encounter (HOSPITAL_BASED_OUTPATIENT_CLINIC_OR_DEPARTMENT_OTHER): Payer: Self-pay | Admitting: Obstetrics & Gynecology

## 2021-09-01 ENCOUNTER — Encounter: Payer: Self-pay | Admitting: Internal Medicine

## 2021-09-01 ENCOUNTER — Other Ambulatory Visit: Payer: Self-pay

## 2021-09-01 ENCOUNTER — Ambulatory Visit: Payer: 59 | Admitting: Internal Medicine

## 2021-09-01 VITALS — BP 106/72 | HR 66 | Ht 69.0 in | Wt 222.0 lb

## 2021-09-01 DIAGNOSIS — R748 Abnormal levels of other serum enzymes: Secondary | ICD-10-CM | POA: Diagnosis not present

## 2021-09-01 LAB — BASIC METABOLIC PANEL
BUN: 14 mg/dL (ref 6–23)
CO2: 32 mEq/L (ref 19–32)
Calcium: 9.5 mg/dL (ref 8.4–10.5)
Chloride: 106 mEq/L (ref 96–112)
Creatinine, Ser: 0.7 mg/dL (ref 0.40–1.20)
GFR: 96.17 mL/min (ref >60.00)
Glucose, Bld: 91 mg/dL (ref 70–99)
Potassium: 4.5 mEq/L (ref 3.5–5.1)
Sodium: 143 mEq/L (ref 135–145)

## 2021-09-01 LAB — ALBUMIN: Albumin: 4.1 g/dL (ref 3.5–5.2)

## 2021-09-01 LAB — MAGNESIUM: Magnesium: 2 mg/dL (ref 1.5–2.5)

## 2021-09-01 LAB — TSH: TSH: 1.17 u[IU]/mL (ref 0.35–5.50)

## 2021-09-01 LAB — PHOSPHORUS: Phosphorus: 3 mg/dL (ref 2.3–4.6)

## 2021-09-01 LAB — VITAMIN D 25 HYDROXY (VIT D DEFICIENCY, FRACTURES): VITD: 32.42 ng/mL (ref 30.00–100.00)

## 2021-09-01 NOTE — Progress Notes (Signed)
Name: Brianna [name]  MRN/ DOB: [id number], Sep 29, 1965    Age/ Sex: 56 y.o., female    PCP: Glenis Smoker, MD   Reason for Endocrinology Evaluation: Elevated Alkaline Phosphatase     Date of Initial Endocrinology Evaluation: 09/01/2021     HPI: Ms. Brianna [name] is a 56 y.o. female with a past medical history of OA and MVP. The patient presented for initial endocrinology clinic visit on 09/01/2021 for consultative assistance with her elevated Alkaline Phosphatase.   The pt has been noted with elevated alkaline phosphatase since 2014.  Of note, she has been noted with elevated PTH in 2017 with a max of 71 pg/mL but this normalized by 2018. She was evaluated by Endocrinology (Dr. Loanne Drilling) in 2017 and hyperparathyroidism was attributed to vitamin D deficiency     She has not been evaluated by GI in  the past  She has no hx of bone density I the pats Has OA in hands and feet , but has pain all over body  No history of fractures in the past   Has occasional right neck swelling   She is on Vitamin D 50,000 iu Q 2 weeks   Mother with osteoporosis and RA  Denies  prior dx of fatty liver  Denies nausea, vomiting abdominal pain or constipation nor diarrhea  No Fh of hypercalcemia  Menopause in early 50's    On MVI       HISTORY:  Past Medical History:  Past Medical History:  Diagnosis Date   Bursitis of hip    bilateral   Deviated nasal septum    Factor V Leiden ([place])    HPV test positive 9/13   neg Pap, +16/18 HPV   Mitral valve prolapse    Molluscum contagiosum    Myofascial pain 2013   Dr. Estanislado Pandy   Obesity    Plantar fasciitis    Tendonitis of elbow, right    Past Surgical History:  Past Surgical History:  Procedure Laterality Date   CESAREAN SECTION  1997, 2000   TEMPOROMANDIBULAR JOINT SURGERY  1988   WISDOM TOOTH EXTRACTION      Social History:  reports that she has never smoked. She has never used smokeless tobacco. She reports current  alcohol use. She reports that she does not use drugs. Family History: family history includes Breast cancer in her maternal aunt; COPD in her mother; Colon cancer (age of onset: 77) in her sister; Diabetes in her father; Berenice Primas' disease in her sister; Hashimoto's thyroiditis in her sister; Heart attack in her mother; Heart disease in her sister; Hyperlipidemia in her father and mother; Hypertension in her father; Parkinson's disease in her father; Rheum arthritis in her mother.   HOME MEDICATIONS: Allergies as of 09/01/2021       Reactions   Z-pak [azithromycin]    GI upset   Amoxicillin-pot Clavulanate Rash   Sulfa Antibiotics Rash        Medication List        Accurate as of September 01, 2021  8:41 AM. If you have any questions, ask your nurse or doctor.          STOP taking these medications    cetirizine 10 MG tablet Commonly known as: ZYRTEC Stopped by: Dorita Sciara, MD       TAKE these medications    azelastine 0.1 % nasal spray Commonly known as: ASTELIN Place into both nostrils.   CO Q 10 PO Take by  mouth daily.   diclofenac Sodium 1 % Gel Commonly known as: Voltaren Apply 4 g topically 4 (four) times daily as needed.   fluticasone 50 MCG/ACT nasal spray Commonly known as: FLONASE Place 2 sprays into both nostrils daily.   levocetirizine 5 MG tablet Commonly known as: XYZAL SMARTSIG:1 Tablet(s) By Mouth Every Evening   Magnesium 500 MG Caps Take by mouth daily.   methocarbamol 500 MG tablet Commonly known as: ROBAXIN TAKE 1 TABLET BY MOUTH AT 7 AM AND 1 TABLET AT 2 PM AS NEEDED   montelukast 10 MG tablet Commonly known as: SINGULAIR Take 10 mg by mouth daily.   multivitamin with minerals tablet Take 1 tablet by mouth daily.   nadolol 40 MG tablet Commonly known as: CORGARD Take 40 mg by mouth daily.   omeprazole 20 MG capsule Commonly known as: PRILOSEC Take 20 mg by mouth daily.   PROBIOTIC DAILY PO Take by mouth daily.    Shingrix injection Generic drug: Zoster Vaccine Adjuvanted Inject into the muscle.   TART CHERRY ADVANCED PO Take by mouth.   traMADol 50 MG tablet Commonly known as: ULTRAM TAKE 1 TABLET(50 MG) BY MOUTH DAILY AS NEEDED   TURMERIC PO Take 450 mg by mouth daily.   vitamin B-12 500 MCG tablet Commonly known as: CYANOCOBALAMIN Take 500 mcg by mouth daily.   Vitamin D (Ergocalciferol) 1.25 MG (50000 UNIT) Caps capsule Commonly known as: DRISDOL Take 1 capsule (50,000 Units total) by mouth every 14 (fourteen) days.          REVIEW OF SYSTEMS: A comprehensive ROS was conducted with the patient and is negative except as per HPI   OBJECTIVE:  VS: BP 106/72 (BP Location: Left Arm, Patient Position: Sitting, Cuff Size: Small)    Pulse 66    Ht 5\' 9"  (1.753 m)    Wt 222 lb (100.7 kg)    LMP 12/22/2012    SpO2 98%    BMI 32.78 kg/m    Wt Readings from Last 3 Encounters:  09/01/21 222 lb (100.7 kg)  06/20/21 221 lb 9.6 oz (100.5 kg)  05/17/21 222 lb 9.6 oz (101 kg)     EXAM: General: Pt appears well and is in NAD  Eyes: External eye exam normal without stare, lid lag or exophthalmos.  EOM intact.    Neck: General: Supple without adenopathy. Thyroid: Thyroid size normal.  No goiter or nodules appreciated.   Lungs: Clear with good BS bilat with no rales, rhonchi, or wheezes  Heart: Auscultation: RRR.  Abdomen: Normoactive bowel sounds, soft, nontender, without masses or organomegaly palpable  Extremities:  BL LE: No pretibial edema normal ROM and strength.  Mental Status: Judgment, insight: Intact Orientation: Oriented to time, place, and person Mood and affect: No depression, anxiety, or agitation     DATA REVIEWED:   Latest Reference Range & Units 09/01/21 08:57  Sodium 135 - 145 mEq/L 143  Potassium 3.5 - 5.1 mEq/L 4.5  Chloride 96 - 112 mEq/L 106  CO2 19 - 32 mEq/L 32  Glucose 70 - 99 mg/dL 91  BUN 6 - 23 mg/dL 14  Creatinine 0.40 - 1.20 mg/dL 0.70  Calcium  8.4 - 10.5 mg/dL 9.5  Phosphorus 2.3 - 4.6 mg/dL 3.0  Magnesium 1.5 - 2.5 mg/dL 2.0  Alkaline Phosphatase 44 - 121 IU/L 168 (H)  Albumin 3.5 - 5.2 g/dL 4.1  GFR >60.00 mL/min 96.17    Latest Reference Range & Units 09/01/21 08:57  VITD 30.00 -  100.00 ng/mL 32.42    Latest Reference Range & Units 09/01/21 08:57  PTH, Intact 16 - 77 pg/mL 39  TSH 0.35 - 5.50 uIU/mL 1.17     Abdominal ultrasound 07/12/2021  FINDINGS: Gallbladder: There are multiple nonshadowing non mobile foci along the gallbladder wall, largest measuring 5 mm. This could be adherent sludge or small polyps. Negative sonographic Murphy sign. No wall thickening or pericholecystic fluid. The gallbladder is nondistended.   Common bile duct: Diameter: 5.3 mm   Liver: No focal lesion identified. Within normal limits in parenchymal echogenicity. Portal vein is patent on color Doppler imaging with normal direction of blood flow towards the liver.   IVC: No abnormality visualized.   Pancreas: Visualized portion unremarkable.   Spleen: Size and appearance within normal limits.   Right Kidney: Length: 10.7. Echogenicity within normal limits. No mass or hydronephrosis visualized.   Left Kidney: Length: 11.4. Echogenicity within normal limits. No mass or hydronephrosis visualized.   Abdominal aorta: No aneurysm visualized.   Other findings: None.   IMPRESSION: Multiple nonshadowing, nonmobile foci along the gallbladder wall, largest measuring 5 mm, which could be small polyps or adherent sludge. No follow-up is required per consensus guidelines.   Otherwise unremarkable abdominal ultrasound with normal sonographic appearance of the liver and biliary ducts.  ASSESSMENT/PLAN/RECOMMENDATIONS:   Elevated Alkaline Phosphatase   - Elevated Alkaline phosphatase could be of hepatic, intestinal or bone origin. - Elevated Alkaline phosphatase of bone origin is due to increase osteoblastic activity.  - Causes  include : hyperthyroidism, hyperparathyroidism, osteomalacia , recent fractures, paget's disease or familial causes.  - Will proceed with alkaline phosphatase isoenzymes  -So far BMP, calcium, PTH, Mg, phos, TSH and vitamin D have come back normal    F/U pending labs results      Signed electronically by: Mack Guise, MD  [facility]  [address] [place], [place] [address] Phone: [phone] FAX: [phone]   CC: Glenis Smoker, MD [place] [address] Phone: [phone] Fax: [phone]   Return to Endocrinology clinic as below: Future Appointments  Date Time Provider [place]  09/01/2021  8:50 AM Leshia Kope, Melanie Crazier, MD [facility] None  10/13/2021  8:40 AM Sanda Klein, MD [facility] [place]  11/15/2021  9:30 AM Bo Merino, MD [facility] None  06/26/2022  8:45 AM Megan Salon, MD [facility]

## 2021-09-01 NOTE — Patient Instructions (Signed)
-   Elevated Alkaline phosphatase could be of hepatic, intestinal or bone origin.

## 2021-09-02 LAB — PARATHYROID HORMONE, INTACT (NO CA): PTH: 39 pg/mL (ref 16–77)

## 2021-09-05 LAB — ALKALINE PHOSPHATASE, ISOENZYMES
Alkaline Phosphatase: 168 IU/L — ABNORMAL HIGH (ref 44–121)
BONE FRACTION: 16 % (ref 14–68)
INTESTINAL FRAC.: 0 % (ref 0–18)
LIVER FRACTION: 84 % (ref 18–85)

## 2021-09-06 ENCOUNTER — Telehealth: Payer: Self-pay | Admitting: Internal Medicine

## 2021-09-06 ENCOUNTER — Encounter: Payer: Self-pay | Admitting: Internal Medicine

## 2021-09-06 DIAGNOSIS — R748 Abnormal levels of other serum enzymes: Secondary | ICD-10-CM

## 2021-09-06 DIAGNOSIS — E2839 Other primary ovarian failure: Secondary | ICD-10-CM

## 2021-09-06 NOTE — Telephone Encounter (Signed)
Brianna [name],   Can you please schedule her for a bone density at [facility] ?   I tried to call her but her voice mail is full and was unable to leave a message. I sent her a portal message that most of her alkaline phophatase is coming from the liver and I have placed a GI referral and to cover endocrinology basis will need a bone density     Thanks

## 2021-09-07 NOTE — Telephone Encounter (Signed)
Appointment has been scheduled and patient notified through [facility]

## 2021-09-09 ENCOUNTER — Encounter: Payer: Self-pay | Admitting: Internal Medicine

## 2021-09-09 ENCOUNTER — Encounter: Payer: Self-pay | Admitting: Physician Assistant

## 2021-09-11 ENCOUNTER — Other Ambulatory Visit: Payer: Self-pay | Admitting: Physician Assistant

## 2021-09-12 ENCOUNTER — Telehealth: Payer: Self-pay | Admitting: *Deleted

## 2021-09-12 NOTE — Telephone Encounter (Signed)
Submitted a Prior Authorization request to [employer] for Tramadol via Cover My Meds. Will update once we receive a response.      

## 2021-09-12 NOTE — Telephone Encounter (Signed)
Next Visit: 11/15/2021   Last Visit: 05/17/2021   UDS:05/17/2021    Narc Agreement: 05/17/2021   Last Fill: 06/08/2021  tramadol very sparingly for pain relief.    Okay to refill Tramadol?

## 2021-09-16 ENCOUNTER — Encounter: Payer: Self-pay | Admitting: Internal Medicine

## 2021-09-19 NOTE — Progress Notes (Signed)
? ? ? ?09/21/2021 ?Brianna [name] ?[id number] ?[birth year] ? ? ?ASSESSMENT AND PLAN:  ? ?Elevated alkaline phosphatase level ?AST 18 ALT 14  ?Alkphos 168 TBili 0.5 ?GGT 20- normal ?Fractioned Alk phos: inconclusive, liver fraction 84%, bone fraction 16, intestinal 0. ?AB [place]:  with no extrahepatic biliary dilatation but did show polyps/sludge- no AB pain ?Will proceed with serologic evaluation for common causes of intrahepatic cholestasis and infiltrative liver disease.  ?No obvious medications but will suggest stopping tumeric ?Pending evaluation will proceed with MRCP versus liver biopsy ? ?HLA B27 (HLA B27 positive) ? ?Screen for colon cancer ?09/24/2015 screening colonoscopy with Brianna [name] showed completely normal colon, recall 7 years 09/2022 ? ?Patient Care Team: ?Brianna Smoker, MD as PCP - General (Family Medicine) ?Croitoru, Dani Gobble, MD as PCP - Cardiology (Cardiology) ?Brianna [name], RD as Dietitian ?Brianna Partridge, DO as Consulting Physician (Neurology) ? ?HISTORY OF PRESENT ILLNESS: ?56 y.o. female referred by Brianna [name], *, with a past medical history of hyperparathyroidism, HLA-B27 positive, migraines, elevated alkaline phosphatase and others listed below presents for evaluation of elevated liver functions.  ? ?06/20/2021: AST 18 06/20/2021: ALT 14  ?09/01/2021: Alkaline Phosphatase 168  ?GGT was 20 ?Patient had fractionated alkaline phosphatase 09/01/2021 that showed liver fraction 84%, bone fraction 16, intestinal 0. ? ?09/24/2015 screening colonoscopy with Brianna [name] showed completely normal colon, recall 7 years 09/2022 ?07/12/2021 abdominal ultrasound showed multiple nonshadowing nonmobile foci in gallbladder wall largest 5 mm, adherent sludge or small polyps.  Gallbladder is nondistended, no wall thickening, common bile duct 5.3, liver normal. ? ?She states she remembers 1 incidence 14 years ago having AB pain with diverticulitis but no regular AB pain, rare twinge.  ?She has occ GERD, on  Prilosec as needed which controls it.  ?No dysphagia, nausea, vomiting.  ?The patient denies any issues with jaundice, scleral icterus, generalized pruritus, darkened/amber urine, clay-colored stools,hematemesis, coffee-ground emesis, abdominal distention, confusion. ?She states she has easy bruising  ?She drinks social, very rare.  ?Paternal aunt with stomach cancer.  ? ?External labs and notes reviewed this visit:  ?06/20/2021  ?HGB 13.7 MCV 92 without evidence of anemia ?WBC 4.6 Platelets 253 ?06/20/2021  ?AST 18 ALT 14 ?Alkphos 168 TBili 0.5 ?09/01/2021  ?TSH 1.17 ? ?Current Medications:  ? ? ?Current Outpatient Medications (Cardiovascular):  ?  nadolol (CORGARD) 40 MG tablet, Take 40 mg by mouth daily. ? ?Current Outpatient Medications (Respiratory):  ?  azelastine (ASTELIN) 0.1 % nasal spray, Place into both nostrils. ?  fluticasone (FLONASE) 50 MCG/ACT nasal spray, Place 2 sprays into both nostrils daily. ?  levocetirizine (XYZAL) 5 MG tablet, SMARTSIG:1 Tablet(s) By Mouth Every Evening ?  montelukast (SINGULAIR) 10 MG tablet, Take 10 mg by mouth daily. ? ?Current Outpatient Medications (Analgesics):  ?  acetaminophen (TYLENOL) 500 MG tablet, Take 500 mg by mouth as needed. ?  Ibuprofen (ADVIL PO), Take 1-2 tablets by mouth as needed. ?  traMADol (ULTRAM) 50 MG tablet, TAKE 1 TABLET(50 MG) BY MOUTH DAILY AS NEEDED ? ?Current Outpatient Medications (Hematological):  ?  cyanocobalamin 500 MCG tablet, Take 500 mcg by mouth daily. ? ?Current Outpatient Medications (Other):  ?  Coenzyme Q10 (CO Q 10 PO), Take by mouth daily. ?  Magnesium 500 MG CAPS, Take by mouth daily. ?  methocarbamol (ROBAXIN) 500 MG tablet, TAKE 1 TABLET BY MOUTH AT 7 AM AND 1 TABLET AT 2 PM AS NEEDED ?  Misc Natural Products (TART CHERRY ADVANCED PO), Take by mouth. ?  Multiple Vitamins-Minerals (MULTIVITAMIN WITH MINERALS) tablet, Take 1 tablet by mouth daily. ?  omeprazole (PRILOSEC) 20 MG capsule, Take 20 mg by mouth daily. ?  Probiotic  Product (PROBIOTIC DAILY PO), Take by mouth daily. ?  TURMERIC PO, Take 450 mg by mouth daily. ?  Vitamin D, Ergocalciferol, (DRISDOL) 1.25 MG (50000 UNIT) CAPS capsule, Take 1 capsule (50,000 Units total) by mouth every 14 (fourteen) days. ? ?Medical History:  ?Past Medical History:  ?Diagnosis Date  ? Arthritis   ? Bursitis of hip   ? bilateral  ? Chronic headaches   ? Deviated nasal septum   ? Factor V Leiden ([place])   ? Fibromyalgia   ? HPV test positive 03/2012  ? neg Pap, +16/18 HPV  ? Mitral valve prolapse   ? Molluscum contagiosum   ? Myofascial pain 2013  ? Dr. Estanislado Pandy  ? Obesity   ? Osteopenia   ? Plantar fasciitis   ? Seizures ([place])   ? Tendonitis of elbow, right   ? ?Allergies:  ?Allergies  ?Allergen Reactions  ? Z-Pak [Azithromycin]   ?  GI upset  ? Amoxicillin-Pot Clavulanate Rash  ? Sulfa Antibiotics Rash  ?  ? ?Surgical History:  ?She  has a past surgical history that includes Cesarean section (1997, 2000); Temporomandibular joint surgery (Left, 1988); and Wisdom tooth extraction. ?Family History:  ?Her family history includes Breast cancer in her maternal aunt; COPD in her mother; Colon polyps in her mother; Colon polyps (age of onset: 60) in her sister; Diabetes in her father; Factor V Leiden deficiency in her mother, sister, and son; Brianna [name]' disease in her sister; Hashimoto's thyroiditis in her sister; Heart attack in her mother; Heart disease in her sister; Hyperlipidemia in her father and mother; Hypertension in her father; Parkinson's disease in her father; Rheum arthritis in her mother; Stomach cancer in her paternal aunt. ?Social History:  ? reports that she has never smoked. She has never used smokeless tobacco. She reports current alcohol use. She reports that she does not use drugs. ? ?REVIEW OF SYSTEMS  : All other systems reviewed and negative except where noted in the History of Present Illness. ? ? ?PHYSICAL EXAM: ?BP 102/78 (BP Location: Left Arm, Patient Position: Sitting, Cuff Size:  Normal)   Pulse 68   Ht 5' 6.75" (1.695 m) Comment: height measured without shoes  Wt 220 lb (99.8 kg)   LMP 12/22/2012   BMI 34.72 kg/m?  ?General:   Pleasant, well developed female in no acute distress ?Head:  Normocephalic and atraumatic. ?Eyes: sclerae anicteric,conjunctive pink  ?Heart:  regular rate and rhythm ?Pulm: Clear anteriorly; no wheezing ?Abdomen:  Soft, Obese AB, skin exam normal, Normal bowel sounds.  no  tenderness . Without guarding and Without rebound, without hepatomegaly. ?Extremities:  Without edema. ?Msk:  Symmetrical without gross deformities. Peripheral pulses intact.  ?Neurologic:  Alert and  oriented x4;  grossly normal neurologically. ?Skin:   Dry and intact without significant lesions or rashes. ?Psychiatric: Demonstrates good judgement and reason without abnormal affect or behaviors. ? ? ?Vladimir Crofts, PA-C ?3:06 PM ? ? ?

## 2021-09-21 ENCOUNTER — Encounter: Payer: Self-pay | Admitting: Physician Assistant

## 2021-09-21 ENCOUNTER — Other Ambulatory Visit (INDEPENDENT_AMBULATORY_CARE_PROVIDER_SITE_OTHER): Payer: 59

## 2021-09-21 ENCOUNTER — Ambulatory Visit: Payer: 59 | Admitting: Physician Assistant

## 2021-09-21 VITALS — BP 102/78 | HR 68 | Ht 66.75 in | Wt 220.0 lb

## 2021-09-21 DIAGNOSIS — Z1589 Genetic susceptibility to other disease: Secondary | ICD-10-CM

## 2021-09-21 DIAGNOSIS — R748 Abnormal levels of other serum enzymes: Secondary | ICD-10-CM | POA: Diagnosis not present

## 2021-09-21 DIAGNOSIS — Z1211 Encounter for screening for malignant neoplasm of colon: Secondary | ICD-10-CM

## 2021-09-21 LAB — HEPATIC FUNCTION PANEL
ALT: 13 U/L (ref 0–35)
AST: 16 U/L (ref 0–37)
Albumin: 4.5 g/dL (ref 3.5–5.2)
Alkaline Phosphatase: 155 U/L — ABNORMAL HIGH (ref 39–117)
Bilirubin, Direct: 0.1 mg/dL (ref 0.0–0.3)
Total Bilirubin: 0.8 mg/dL (ref 0.2–1.2)
Total Protein: 7.7 g/dL (ref 6.0–8.3)

## 2021-09-21 LAB — IBC + FERRITIN
Ferritin: 190.6 ng/mL (ref 10.0–291.0)
Iron: 103 ug/dL (ref 42–145)
Saturation Ratios: 29.5 % (ref 20.0–50.0)
TIBC: 348.6 ug/dL (ref 250.0–450.0)
Transferrin: 249 mg/dL (ref 212.0–360.0)

## 2021-09-21 LAB — C-REACTIVE PROTEIN: CRP: <1 mg/dL (ref 0.5–20.0)

## 2021-09-21 NOTE — Patient Instructions (Signed)
If you are age 58 or older, your body mass index should be between 23-30. Your Body mass index is 34.72 kg/m?Marland Kitchen If this is out of the aforementioned range listed, please consider follow up with your Primary Care Provider. ? ?If you are age 64 or younger, your body mass index should be between 19-25. Your Body mass index is 34.72 kg/m?Marland Kitchen If this is out of the aformentioned range listed, please consider follow up with your Primary Care Provider.  ? ?________________________________________________________ ? ?The [facility] providers would like to encourage you to use [facility] to communicate with providers for non-urgent requests or questions.  Due to long hold times on the telephone, sending your provider a message by [facility] may be a faster and more efficient way to get a response.  Please allow 48 business hours for a response.  Please remember that this is for non-urgent requests.  ?_______________________________________________________ ? ?Your provider has requested that you go to the basement level for lab work before leaving today. Press "B" on the elevator. The lab is located at the first door on the left as you exit the elevator. ? ?Due to recent changes in healthcare laws, you may see the results of your imaging and laboratory studies on MyChart before your provider has had a chance to review them.  We understand that in some cases there may be results that are confusing or concerning to you. Not all laboratory results come back in the same time frame and the provider may be waiting for multiple results in order to interpret others.  Please give [place] 48 hours in order for your provider to thoroughly review all the results before contacting the office for clarification of your results.  ? ?It was a pleasure to see you today! ? ?Thank you for trusting me with your gastrointestinal care!   ? ? ? ? ?

## 2021-09-22 NOTE — Progress Notes (Signed)
____________________________________________________________ ? ?Attending physician addendum: ? ?Thank you for sending this case to me. ?I have reviewed the entire note/ ? ?Normal GGT with no evidence of biliary ductal dilatation on ultrasound indicates that this modestly elevated alkaline phosphatase is not of biliary origin. ?She appears to have already gone to the lab for the additional serologic testing, but she needs no further imaging. ? ?Amada Jupiter, MD ? ?____________________________________________________________ ? ?

## 2021-09-26 LAB — HEPATITIS B SURFACE ANTIBODY,QUALITATIVE: Hep B S Ab: NONREACTIVE

## 2021-09-26 LAB — IGA: Immunoglobulin A: 158 mg/dL (ref 47–310)

## 2021-09-26 LAB — HEPATITIS C ANTIBODY
Hepatitis C Ab: NONREACTIVE
SIGNAL TO CUT-OFF: <0.02 (ref <1.00)

## 2021-09-26 LAB — TISSUE TRANSGLUTAMINASE, IGA: (tTG) Ab, IgA: <1 U/mL

## 2021-09-26 LAB — HEPATITIS A ANTIBODY, TOTAL: Hepatitis A AB,Total: NONREACTIVE

## 2021-09-26 LAB — CERULOPLASMIN: Ceruloplasmin: 30 mg/dL (ref 18–53)

## 2021-09-26 LAB — MITOCHONDRIAL ANTIBODIES: Mitochondrial M2 Ab, IgG: <=20 U (ref <=20.0)

## 2021-09-26 LAB — IGG: IgG (Immunoglobin G), Serum: 1031 mg/dL (ref 600–1640)

## 2021-09-26 LAB — ANA: Anti Nuclear Antibody (ANA): NEGATIVE

## 2021-09-26 LAB — HEPATITIS B SURFACE ANTIGEN: Hepatitis B Surface Ag: NONREACTIVE

## 2021-09-26 LAB — ALPHA-1-ANTITRYPSIN: A-1 Antitrypsin, Ser: 116 mg/dL (ref 83–199)

## 2021-09-26 LAB — ANTI-SMOOTH MUSCLE ANTIBODY, IGG: Actin (Smooth Muscle) Antibody (IGG): <20 U (ref <20)

## 2021-10-13 ENCOUNTER — Encounter: Payer: Self-pay | Admitting: Cardiovascular Disease

## 2021-10-13 ENCOUNTER — Ambulatory Visit: Payer: 59 | Admitting: Cardiovascular Disease

## 2021-10-13 ENCOUNTER — Other Ambulatory Visit: Payer: Self-pay

## 2021-10-13 VITALS — BP 120/74 | HR 60 | Ht 69.0 in | Wt 219.0 lb

## 2021-10-13 DIAGNOSIS — I4719 Other supraventricular tachycardia: Secondary | ICD-10-CM

## 2021-10-13 DIAGNOSIS — M25512 Pain in left shoulder: Secondary | ICD-10-CM

## 2021-10-13 DIAGNOSIS — I471 Supraventricular tachycardia: Secondary | ICD-10-CM | POA: Diagnosis not present

## 2021-10-13 DIAGNOSIS — E7800 Pure hypercholesterolemia, unspecified: Secondary | ICD-10-CM

## 2021-10-13 NOTE — Patient Instructions (Signed)
Medication Instructions:  ?No changes ?*If you need a refill on your cardiac medications before your next appointment, please call your pharmacy* ? ? ?Lab Work: ?None ordered ?If you have labs (blood work) drawn today and your tests are completely normal, you will receive your results only by: ?MyChart Message (if you have MyChart) OR ?A paper copy in the mail ?If you have any lab test that is abnormal or we need to change your treatment, we will call you to review the results. ? ? ?Testing/Procedures: ?Dr. Croitoru has ordered a CT coronary calcium score.  ? ?Test locations:  ?[facility] ([address] 3rd Floor [place], [place] [address]) ?[facility] ([address] [place], [place] [address])  ? ?This is $99 out of pocket. ? ? ?Coronary CalciumScan ?A coronary calcium scan is an imaging test used to look for deposits of calcium and other fatty materials (plaques) in the inner lining of the blood vessels of the heart (coronary arteries). These deposits of calcium and plaques can partly clog and narrow the coronary arteries without producing any symptoms or warning signs. This puts a person at risk for a heart attack. This test can detect these deposits before symptoms develop. ?Tell a health care provider about: ?Any allergies you have. ?All medicines you are taking, including vitamins, herbs, eye drops, creams, and over-the-counter medicines. ?Any problems you or family members have had with anesthetic medicines. ?Any blood disorders you have. ?Any surgeries you have had. ?Any medical conditions you have. ?Whether you are pregnant or may be pregnant. ?What are the risks? ?Generally, this is a safe procedure. However, problems may occur, including: ?Harm to a pregnant woman and her unborn baby. This test involves the use of radiation. Radiation exposure can be dangerous to a pregnant woman and her unborn baby. If you are pregnant, you generally should not have this procedure done. ?Slight increase in  the risk of cancer. This is because of the radiation involved in the test. ?What happens before the procedure? ?No preparation is needed for this procedure. ?What happens during the procedure? ?You will undress and remove any jewelry around your neck or chest. ?You will put on a hospital gown. ?Sticky electrodes will be placed on your chest. The electrodes will be connected to an electrocardiogram (ECG) machine to record a tracing of the electrical activity of your heart. ?A CT scanner will take pictures of your heart. During this time, you will be asked to lie still and hold your breath for 2-3 seconds while a picture of your heart is being taken. ?The procedure may vary among health care providers and hospitals. ?What happens after the procedure? ?You can get dressed. ?You can return to your normal activities. ?It is up to you to get the results of your test. Ask your health care provider, or the department that is doing the test, when your results will be ready. ?Summary ?A coronary calcium scan is an imaging test used to look for deposits of calcium and other fatty materials (plaques) in the inner lining of the blood vessels of the heart (coronary arteries). ?Generally, this is a safe procedure. Tell your health care provider if you are pregnant or may be pregnant. ?No preparation is needed for this procedure. ?A CT scanner will take pictures of your heart. ?You can return to your normal activities after the scan is done. ?This information is not intended to replace advice given to you by your health care provider. Make sure you discuss any questions you have   with your health care provider. ?Document Released: 01/06/2008 Document Revised: 05/29/2016 Document Reviewed: 05/29/2016 ?Elsevier Interactive Patient Education ? 2017 Elsevier Inc. ? ? ? ?Follow-Up: ?At [facility], you and your health needs are our priority.  As part of our continuing mission to provide you with exceptional heart care, we have created  designated Provider Care Teams.  These Care Teams include your primary Cardiologist (physician) and Advanced Practice Providers (APPs -  Physician Assistants and Nurse Practitioners) who all work together to provide you with the care you need, when you need it. ? ?We recommend signing up for the patient portal called "MyChart".  Sign up information is provided on this After Visit Summary.  MyChart is used to connect with patients for Virtual Visits (Telemedicine).  Patients are able to view lab/test results, encounter notes, upcoming appointments, etc.  Non-urgent messages can be sent to your provider as well.   ?To learn more about what you can do with MyChart, go to [email or web address].   ? ?Your next appointment:   ?12 month(s) ? ?The format for your next appointment:   ?In Person ? ?Provider:   ?Mihai Croitoru, MD { ? ? ?

## 2021-10-13 NOTE — Progress Notes (Signed)
Cardiology Consultation Note:    Date:  10/13/2021   ID:  Brianna [name], DOB 03/26/1966, MRN [id number]  PCP:  Shon Hale, MD  Cardiologist:  Thurmon Fair, MD  Electrophysiologist:  None   Referring MD: Shon Hale, * MD  Chief Complaint  Patient presents with   Palpitations   Shoulder Pain     History of Present Illness:    Brianna [name] is a 56 y.o. female with a hx of questionable diagnosis of mitral valve prolapse, fibromyalgia, factor V Leiden without clinical thromboembolic events, referred for palpitations.  She has mildly elevated cholesterol, but also has an excellent HDL cholesterol.  Her father, Lynita Lombard, is also my patient.  She continues to have occasional palpitations.  They occur randomly without any noticeable pattern.  They occur at anytime of the day and are not associated with periods of increased emotions or physical activity.  They usually last less than 5 minutes, sometimes just a few seconds.  Event monitor showed ectopic atrial tachycardia, no atrial fibrillation.  She has recently had some problems with pain in her left shoulder.  She had 2 episodes of pressure-like sensation in both the anterior and posterior aspect of her left shoulder.  Changing position appeared to improve the symptoms.  Both occasions the symptoms occurred at rest and were not associated with physical activity.  They did occur at the end of a long day when she had worked more than usual.  Evaluation for palpitations in the late 80s led to a diagnosis of mitral valve prolapse, but an echocardiogram in 2016 did not confirm this.  The mitral valve appears structurally normal and showed only mild regurgitation left ventricular ejection fraction was 55 to 60%.  The study was interpreted showing pseudonormal mitral inflow pattern, but I believe that her mitral annulus Doppler velocities are probably normal (normal diastolic function).  The left atrium was mildly  dilated.  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 Corliss Skains   Obesity    Osteopenia    Plantar fasciitis    Seizures (HCC)    Tendonitis of elbow, right     Past Surgical History:  Procedure Laterality Date   CESAREAN SECTION  1997, 2000   TEMPOROMANDIBULAR JOINT SURGERY Left 1988   WISDOM TOOTH EXTRACTION      Current Medications: Current Meds  Medication Sig   acetaminophen (TYLENOL) 500 MG tablet Take 500 mg by mouth as needed.   azelastine (ASTELIN) 0.1 % nasal spray Place into both nostrils.   Coenzyme Q10 (CO Q 10 PO) Take by mouth daily.   cyanocobalamin 500 MCG tablet Take 500 mcg by mouth daily.   Ibuprofen (ADVIL PO) Take 1-2 tablets by mouth as needed.   levocetirizine (XYZAL) 5 MG tablet SMARTSIG:1 Tablet(s) By Mouth Every Evening   Magnesium 500 MG CAPS Take by mouth daily.   methocarbamol (ROBAXIN) 500 MG tablet TAKE 1 TABLET BY MOUTH AT 7 AM AND 1 TABLET AT 2 PM AS NEEDED   Misc Natural Products (TART CHERRY ADVANCED PO) Take by mouth.   montelukast (SINGULAIR) 10 MG tablet Take 10  mg by mouth daily.   Multiple Vitamins-Minerals (MULTIVITAMIN WITH MINERALS) tablet Take 1 tablet by mouth daily.   nadolol (CORGARD) 40 MG tablet Take 40 mg by mouth daily.   omeprazole (PRILOSEC) 20 MG capsule Take 20 mg by mouth daily.   Probiotic Product (PROBIOTIC DAILY PO) Take by mouth daily.   traMADol (ULTRAM) 50 MG tablet TAKE 1 TABLET(50 MG) BY MOUTH DAILY AS NEEDED   TURMERIC PO Take 450 mg by mouth daily.   Vitamin D, Ergocalciferol, (DRISDOL) 1.25 MG (50000 UNIT) CAPS capsule Take 1 capsule (50,000 Units total) by mouth every 14 (fourteen) days.     Allergies:   Z-pak [azithromycin], Amoxicillin-pot  clavulanate, and Sulfa antibiotics   Social History   Socioeconomic History   Marital status: Married    Spouse name: Casimiro Needle   Number of children: 2   Years of education: Not on file   Highest education level: Associate degree: academic program  Occupational History   Occupation: Accounting  Tobacco Use   Smoking status: Never   Smokeless tobacco: Never  Vaping Use   Vaping Use: Never used  Substance and Sexual Activity   Alcohol use: Yes    Comment: rarely   Drug use: No   Sexual activity: Yes    Partners: Male    Birth control/protection: Other-see comments, Post-menopausal    Comment: vasectomy  Other Topics Concern   Not on file  Social History Narrative   Patient is right-handed. She lives with her husband in a 2 level home. She drinks tea 1-2 x a day. And an occasional soda. She walks most days for 30 minutes.   Social Determinants of Health   Financial Resource Strain: Not on file  Food Insecurity: Not on file  Transportation Needs: Not on file  Physical Activity: Not on file  Stress: Not on file  Social Connections: Not on file     Family History: The patient's family history includes Breast cancer in her maternal aunt; COPD in her mother; Colon polyps in her mother; Colon polyps (age of onset: 72) in her sister; Diabetes in her father; Factor V Leiden deficiency in her mother, sister, and son; Luiz Blare' disease in her sister; Hashimoto's thyroiditis in her sister; Heart attack in her mother; Heart disease in her sister; Hyperlipidemia in her father and mother; Hypertension in her father; Parkinson's disease in her father; Rheum arthritis in her mother; Stomach cancer in her paternal aunt. There is no history of Hyperparathyroidism.  ROS:   Please see the history of present illness.     All other systems reviewed and are negative.  EKGs/Labs/Other Studies Reviewed:    The following studies were reviewed today: EVENT MONITOR 07/31/2019 The dominant rhythm is  normal sinus rhythm with normal circadian variation. There are occasional brief episodes of nonsustained ectopic atrial tachycardia, 5-8 beats long. There is no meaningful ventricular arrhythmia and no atrial fibrillation. There is no significant bradycardia   Mildly abnormal event monitor due to the presence of occasional brief nonsustained atrial tachycardia.     EKG:  EKG is  ordered today. Shows normal sinus rhythm and is a completely normal tracing.  Recent Labs: 06/20/2021: Hemoglobin 13.7; Platelets 253 09/01/2021: BUN 14; Creatinine, Ser 0.70; Magnesium 2.0; Potassium 4.5; Sodium 143; TSH 1.17 09/21/2021: ALT 13  Recent Lipid Panel    Component Value Date/Time   CHOL 219 (H) 06/20/2021 1012   TRIG 69 06/20/2021 1012   HDL 68 06/20/2021 1012   CHOLHDL 3.2 06/20/2021 1012  CHOLHDL 2.9 11/16/2016 1012   VLDL 11 11/16/2016 1012   LDLCALC 139 (H) 06/20/2021 1012    Physical Exam:    VS:  BP 120/74   Pulse 60   Ht 5\' 9"  (1.753 m)   Wt 219 lb (99.3 kg)   LMP 12/22/2012   SpO2 94%   BMI 32.34 kg/m     Wt Readings from Last 3 Encounters:  10/13/21 219 lb (99.3 kg)  09/21/21 220 lb (99.8 kg)  09/01/21 222 lb (100.7 kg)      General: Alert, oriented x3, no distress Head: no evidence of trauma, PERRL, EOMI, no exophtalmos or lid lag, no myxedema, no xanthelasma; normal ears, nose and oropharynx Neck: normal jugular venous pulsations and no hepatojugular reflux; brisk carotid pulses without delay and no carotid bruits Chest: clear to auscultation, no signs of consolidation by percussion or palpation, normal fremitus, symmetrical and full respiratory excursions Cardiovascular: normal position and quality of the apical impulse, regular rhythm, normal first and second heart sounds, no murmurs, rubs or gallops Abdomen: no tenderness or distention, no masses by palpation, no abnormal pulsatility or arterial bruits, normal bowel sounds, no hepatosplenomegaly Extremities: no  clubbing, cyanosis or edema; 2+ radial, ulnar and brachial pulses bilaterally; 2+ right femoral, posterior tibial and dorsalis pedis pulses; 2+ left femoral, posterior tibial and dorsalis pedis pulses; no subclavian or femoral bruits Neurological: grossly nonfocal Psych: Normal mood and affect   ASSESSMENT:    1. Ectopic atrial tachycardia (HCC)   2. Hypercholesterolemia   3. Acute pain of left shoulder     PLAN:    In order of problems listed above:  Palpitations: Episodes are brief and infrequent.  Taking nadolol as needed only.  Event monitor showed ectopic atrial tachycardia. No significant structural abnormalities. Continue current care. HLP: Borderline indication for lipid-lowering therapy.  Only other coronary risk factor is family history (not at a young age anyway).  Reluctant to start empirical statin therapy due to baseline problems with musculoskeletal pain and recent issues with abnormal liver function tests.  Check calcium score. L shoulder pain: symptoms strongly suggest musculoskeletal etiology.    Medication Adjustments/Labs and Tests Ordered: Current medicines are reviewed at length with the patient today.  Concerns regarding medicines are outlined above.  Orders Placed This Encounter  Procedures   CT CARDIAC SCORING (SELF PAY ONLY)   EKG 12-Lead   No orders of the defined types were placed in this encounter.   Patient Instructions  Medication Instructions:  No changes *If you need a refill on your cardiac medications before your next appointment, please call your pharmacy*   Lab Work: None ordered If you have labs (blood work) drawn today and your tests are completely normal, you will receive your results only by: MyChart Message (if you have MyChart) OR A paper copy in the mail If you have any lab test that is abnormal or we need to change your treatment, we will call you to review the results.   Testing/Procedures: Dr. Royann Shivers has ordered a CT  coronary calcium score.   Test locations:  [facility] ([address] Floor [place], [place] [address]) [facility] ([address] [place], [place] [address])   This is $99 out of pocket.   Coronary CalciumScan A coronary calcium scan is an imaging test used to look for deposits of calcium and other fatty materials (plaques) in the inner lining of the blood vessels of the heart (coronary arteries). These deposits of calcium and plaques can partly  clog and narrow the coronary arteries without producing any symptoms or warning signs. This puts a person at risk for a heart attack. This test can detect these deposits before symptoms develop. Tell a health care provider about: Any allergies you have. All medicines you are taking, including vitamins, herbs, eye drops, creams, and over-the-counter medicines. Any problems you or family members have had with anesthetic medicines. Any blood disorders you have. Any surgeries you have had. Any medical conditions you have. Whether you are pregnant or may be pregnant. What are the risks? Generally, this is a safe procedure. However, problems may occur, including: Harm to a pregnant woman and her unborn baby. This test involves the use of radiation. Radiation exposure can be dangerous to a pregnant woman and her unborn baby. If you are pregnant, you generally should not have this procedure done. Slight increase in the risk of cancer. This is because of the radiation involved in the test. What happens before the procedure? No preparation is needed for this procedure. What happens during the procedure? You will undress and remove any jewelry around your neck or chest. You will put on a hospital gown. Sticky electrodes will be placed on your chest. The electrodes will be connected to an electrocardiogram (ECG) machine to record a tracing of the electrical activity of your heart. A CT scanner will take pictures of your heart. During this  time, you will be asked to lie still and hold your breath for 2-3 seconds while a picture of your heart is being taken. The procedure may vary among health care providers and hospitals. What happens after the procedure? You can get dressed. You can return to your normal activities. It is up to you to get the results of your test. Ask your health care provider, or the department that is doing the test, when your results will be ready. Summary A coronary calcium scan is an imaging test used to look for deposits of calcium and other fatty materials (plaques) in the inner lining of the blood vessels of the heart (coronary arteries). Generally, this is a safe procedure. Tell your health care provider if you are pregnant or may be pregnant. No preparation is needed for this procedure. A CT scanner will take pictures of your heart. You can return to your normal activities after the scan is done. This information is not intended to replace advice given to you by your health care provider. Make sure you discuss any questions you have with your health care provider. Document Released: 01/06/2008 Document Revised: 05/29/2016 Document Reviewed: 05/29/2016 Elsevier Interactive Patient Education  2017 [employer].    Follow-Up: At [facility], you and your health needs are our priority.  As part of our continuing mission to provide you with exceptional heart care, we have created designated Provider Care Teams.  These Care Teams include your primary Cardiologist (physician) and Advanced Practice Providers (APPs -  Physician Assistants and Nurse Practitioners) who all work together to provide you with the care you need, when you need it.  We recommend signing up for the patient portal called "MyChart".  Sign up information is provided on this After Visit Summary.  MyChart is used to connect with patients for Virtual Visits (Telemedicine).  Patients are able to view lab/test results, encounter notes,  upcoming appointments, etc.  Non-urgent messages can be sent to your provider as well.   To learn more about what you can do with MyChart, go to [email or web address].    Your next  appointment:   12 month(s)  The format for your next appointment:   In Person  Provider:   Thurmon Fair, MD {    Signed, Thurmon Fair, MD  10/13/2021 9:17 AM    [facility]

## 2021-11-01 NOTE — Progress Notes (Deleted)
Office Visit Note  Patient: Brianna [name]             Date of Birth: Dec 14, 1965           MRN: [id number]             PCP: Glenis Smoker, MD Referring: Glenis Smoker, * Visit Date: 11/15/2021 Occupation: @GUAROCC @  Subjective:  No chief complaint on file.   History of Present Illness: Brianna [name] is a 56 y.o. female ***   Activities of Daily Living:  Patient reports morning stiffness for *** {minute/hour:19697}.   Patient {ACTIONS;DENIES/REPORTS:21021675::"Denies"} nocturnal pain.  Difficulty dressing/grooming: {ACTIONS;DENIES/REPORTS:21021675::"Denies"} Difficulty climbing stairs: {ACTIONS;DENIES/REPORTS:21021675::"Denies"} Difficulty getting out of chair: {ACTIONS;DENIES/REPORTS:21021675::"Denies"} Difficulty using hands for taps, buttons, cutlery, and/or writing: {ACTIONS;DENIES/REPORTS:21021675::"Denies"}  No Rheumatology ROS completed.   PMFS History:  Patient Active Problem List   Diagnosis Date Noted  . Elevated alkaline phosphatase level 09/01/2021  . Cracked lips 07/04/2020  . Deviated septum 04/08/2019  . Chronic nasal congestion 04/08/2019  . Hypertrophy of both inferior nasal turbinates 04/08/2019  . Myofascial pain syndrome 08/17/2016  . HLA B27 (HLA B27 positive) 08/17/2016  . Trochanteric bursitis of both hips 08/17/2016  . History of migraine 08/17/2016  . Primary osteoarthritis of both hands 08/17/2016  . Primary osteoarthritis of both feet 08/17/2016  . Plantar fasciitis 08/17/2016  . Hyperparathyroidism ([place]) 09/20/2015  . 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 Arthritis   . Bursitis of hip    bilateral  . Chronic headaches   . Deviated nasal septum   . Factor V Leiden ([place])   . Fibromyalgia   . HPV test positive 03/2012   neg Pap, +16/18 HPV  . Mitral valve prolapse   . Molluscum contagiosum   . Myofascial pain 2013   Dr. Estanislado Pandy  . Obesity   . Osteopenia   . Plantar  fasciitis   . Seizures ([place])   . Tendonitis of elbow, right     Family History  Problem Relation Age of Onset  . COPD Mother   . Rheum arthritis Mother   . Hyperlipidemia Mother   . Heart attack Mother        01/2016  . Colon polyps Mother   . Factor V Leiden deficiency Mother   . Diabetes Father   . Hypertension Father   . Hyperlipidemia Father   . Parkinson's disease Father   . Colon polyps Sister 73       pre-cancerous polyps-had surgery  . Heart disease Sister   . Latia Mataya' disease Sister   . Hashimoto's thyroiditis Sister   . Factor V Leiden deficiency Sister   . Breast cancer Maternal Aunt   . Stomach cancer Paternal Aunt   . Factor V Leiden deficiency Son   . Hyperparathyroidism Neg Hx    Past Surgical History:  Procedure Laterality Date  . CESAREAN SECTION  1997, 2000  . TEMPOROMANDIBULAR JOINT SURGERY Left 1988  . WISDOM TOOTH EXTRACTION     Social History   Social History Narrative   Patient is right-handed. She lives with her husband in a 2 level home. She drinks tea 1-2 x a day. And an occasional soda. She walks most days for 30 minutes.   Immunization History  Administered Date(s) Administered  . Influenza,inj,Quad PF,6+ Mos 04/22/2019  . Influenza-Unspecified 05/18/2020, 05/20/2021  . Moderna Sars-Covid-2 Vaccination 07/29/2019, 08/26/2019, 06/29/2020  . Pension scheme manager 65yrs & up 04/07/2021  .  Tdap 07/30/2015  . Zoster Recombinat (Shingrix) 08/24/2018     Objective: Vital Signs: LMP 12/22/2012    Physical Exam   Musculoskeletal Exam: ***  CDAI Exam: CDAI Score: -- Patient Global: --; Provider Global: -- Swollen: --; Tender: -- Joint Exam 11/15/2021   No joint exam has been documented for this visit   There is currently no information documented on the homunculus. Go to the Rheumatology activity and complete the homunculus joint exam.  Investigation: No additional findings.  Imaging: No results found.  Recent  Labs: Lab Results  Component Value Date   WBC 4.6 06/20/2021   HGB 13.7 06/20/2021   PLT 253 06/20/2021   NA 143 09/01/2021   K 4.5 09/01/2021   CL 106 09/01/2021   CO2 32 09/01/2021   GLUCOSE 91 09/01/2021   BUN 14 09/01/2021   CREATININE 0.70 09/01/2021   BILITOT 0.8 09/21/2021   ALKPHOS 155 (H) 09/21/2021   AST 16 09/21/2021   ALT 13 09/21/2021   PROT 7.7 09/21/2021   ALBUMIN 4.5 09/21/2021   CALCIUM 9.5 09/01/2021   GFRAA 110 05/16/2019    Speciality Comments: No specialty comments available.  Procedures:  No procedures performed Allergies: Z-pak [azithromycin], Amoxicillin-pot clavulanate, and Sulfa antibiotics   Assessment / Plan:     Visit Diagnoses: No diagnosis found.  Orders: No orders of the defined types were placed in this encounter.  No orders of the defined types were placed in this encounter.   Face-to-face time spent with patient was *** minutes. Greater than 50% of time was spent in counseling and coordination of care.  Follow-Up Instructions: No follow-ups on file.   Earnestine Mealing, CMA  Note - This record has been created using Editor, commissioning.  Chart creation errors have been sought, but may not always  have been located. Such creation errors do not reflect on  the standard of medical care.

## 2021-11-09 ENCOUNTER — Encounter (HOSPITAL_BASED_OUTPATIENT_CLINIC_OR_DEPARTMENT_OTHER): Payer: Self-pay | Admitting: Obstetrics & Gynecology

## 2021-11-09 ENCOUNTER — Encounter: Payer: Self-pay | Admitting: Internal Medicine

## 2021-11-14 NOTE — Progress Notes (Deleted)
 Office Visit Note  Patient: Brianna [name]             Date of Birth: 01/01/1966           MRN: [id number]             PCP: Timberlake, Kathryn S, MD Referring: Timberlake, Kathryn S, * Visit Date: 11/22/2021 Occupation: @GUAROCC@  Subjective:  No chief complaint on file.   History of Present Illness: Brianna [name] is a 55 y.o. female ***   Activities of Daily Living:  Patient reports morning stiffness for *** {minute/hour:19697}.   Patient {ACTIONS;DENIES/REPORTS:21021675::"Denies"} nocturnal pain.  Difficulty dressing/grooming: {ACTIONS;DENIES/REPORTS:21021675::"Denies"} Difficulty climbing stairs: {ACTIONS;DENIES/REPORTS:21021675::"Denies"} Difficulty getting out of chair: {ACTIONS;DENIES/REPORTS:21021675::"Denies"} Difficulty using hands for taps, buttons, cutlery, and/or writing: {ACTIONS;DENIES/REPORTS:21021675::"Denies"}  No Rheumatology ROS completed.   PMFS History:  Patient Active Problem List   Diagnosis Date Noted   Elevated alkaline phosphatase level 09/01/2021   Cracked lips 07/04/2020   Deviated septum 04/08/2019   Chronic nasal congestion 04/08/2019   Hypertrophy of both inferior nasal turbinates 04/08/2019   Myofascial pain syndrome 08/17/2016   HLA B27 (HLA B27 positive) 08/17/2016   Trochanteric bursitis of both hips 08/17/2016   History of migraine 08/17/2016   Primary osteoarthritis of both hands 08/17/2016   Primary osteoarthritis of both feet 08/17/2016   Plantar fasciitis 08/17/2016   Hyperparathyroidism ([facility]) 09/20/2015   High risk HPV infection 05/19/2013    Past Medical History:  Diagnosis Date   Arthritis    Bursitis of hip    bilateral   Chronic headaches    Deviated nasal septum    Factor V Leiden ([facility])    Fibromyalgia    HPV test positive 03/2012   neg Pap, +16/18 HPV   Mitral valve prolapse    Molluscum contagiosum    Myofascial pain 2013   Dr. Deveshwar   Obesity    Osteopenia    Plantar fasciitis    Seizures ([facility])     Tendonitis of elbow, right     Family History  Problem Relation Age of Onset   COPD Mother    Rheum arthritis Mother    Hyperlipidemia Mother    Heart attack Mother        01/2016   Colon polyps Mother    Factor V Leiden deficiency Mother    Diabetes Father    Hypertension Father    Hyperlipidemia Father    Parkinson's disease Father    Colon polyps Sister 46       pre-cancerous polyps-had surgery   Heart disease Sister    Graves' disease Sister    Hashimoto's thyroiditis Sister    Factor V Leiden deficiency Sister    Breast cancer Maternal Aunt    Stomach cancer Paternal Aunt    Factor V Leiden deficiency Son    Hyperparathyroidism Neg Hx    Past Surgical History:  Procedure Laterality Date   CESAREAN SECTION  1997, 2000   TEMPOROMANDIBULAR JOINT SURGERY Left 1988   WISDOM TOOTH EXTRACTION     Social History   Social History Narrative   Patient is right-handed. She lives with her husband in a 2 level home. She drinks tea 1-2 x a day. And an occasional soda. She walks most days for 30 minutes.   Immunization History  Administered Date(s) Administered   Influenza,inj,Quad PF,6+ Mos 04/22/2019   Influenza-Unspecified 05/18/2020, 05/20/2021   Moderna Sars-Covid-2 Vaccination 07/29/2019, 08/26/2019, 06/29/2020   Pfizer Covid-19 Vaccine Bivalent Booster 12yrs & up 04/07/2021     Tdap 07/30/2015   Zoster Recombinat (Shingrix) 08/24/2018     Objective: Vital Signs: LMP 12/22/2012    Physical Exam   Musculoskeletal Exam: ***  CDAI Exam: CDAI Score: -- Patient Global: --; Provider Global: -- Swollen: --; Tender: -- Joint Exam 11/22/2021   No joint exam has been documented for this visit   There is currently no information documented on the homunculus. Go to the Rheumatology activity and complete the homunculus joint exam.  Investigation: No additional findings.  Imaging: No results found.  Recent Labs: Lab Results  Component Value Date   WBC 4.6  06/20/2021   HGB 13.7 06/20/2021   PLT 253 06/20/2021   NA 143 09/01/2021   K 4.5 09/01/2021   CL 106 09/01/2021   CO2 32 09/01/2021   GLUCOSE 91 09/01/2021   BUN 14 09/01/2021   CREATININE 0.70 09/01/2021   BILITOT 0.8 09/21/2021   ALKPHOS 155 (H) 09/21/2021   AST 16 09/21/2021   ALT 13 09/21/2021   PROT 7.7 09/21/2021   ALBUMIN 4.5 09/21/2021   CALCIUM 9.5 09/01/2021   GFRAA 110 05/16/2019    Speciality Comments: No specialty comments available.  Procedures:  No procedures performed Allergies: Z-pak [azithromycin], Amoxicillin-pot clavulanate, and Sulfa antibiotics   Assessment / Plan:     Visit Diagnoses: Myofascial pain syndrome  Trapezius muscle spasm  Primary osteoarthritis of both hands  Trochanteric bursitis of both hips  Primary osteoarthritis of both feet  Plantar fasciitis  HLA B27 (HLA B27 positive)  Vitamin D deficiency  Hyperparathyroidism ([place])  History of migraine  Orders: No orders of the defined types were placed in this encounter.  No orders of the defined types were placed in this encounter.   Face-to-face time spent with patient was *** minutes. Greater than 50% of time was spent in counseling and coordination of care.  Follow-Up Instructions: No follow-ups on file.   Ofilia Neas, PA-C  Note - This record has been created using Dragon software.  Chart creation errors have been sought, but may not always  have been located. Such creation errors do not reflect on  the standard of medical care.

## 2021-11-15 ENCOUNTER — Ambulatory Visit: Payer: 59 | Admitting: Rheumatology

## 2021-11-15 DIAGNOSIS — M19071 Primary osteoarthritis, right ankle and foot: Secondary | ICD-10-CM

## 2021-11-15 DIAGNOSIS — E213 Hyperparathyroidism, unspecified: Secondary | ICD-10-CM

## 2021-11-15 DIAGNOSIS — M19041 Primary osteoarthritis, right hand: Secondary | ICD-10-CM

## 2021-11-15 DIAGNOSIS — Z5181 Encounter for therapeutic drug level monitoring: Secondary | ICD-10-CM

## 2021-11-15 DIAGNOSIS — M7918 Myalgia, other site: Secondary | ICD-10-CM

## 2021-11-15 DIAGNOSIS — M722 Plantar fascial fibromatosis: Secondary | ICD-10-CM

## 2021-11-15 DIAGNOSIS — Z1589 Genetic susceptibility to other disease: Secondary | ICD-10-CM

## 2021-11-15 DIAGNOSIS — M7061 Trochanteric bursitis, right hip: Secondary | ICD-10-CM

## 2021-11-15 DIAGNOSIS — Z8669 Personal history of other diseases of the nervous system and sense organs: Secondary | ICD-10-CM

## 2021-11-15 DIAGNOSIS — M62838 Other muscle spasm: Secondary | ICD-10-CM

## 2021-11-15 DIAGNOSIS — E559 Vitamin D deficiency, unspecified: Secondary | ICD-10-CM

## 2021-11-21 ENCOUNTER — Encounter (HOSPITAL_BASED_OUTPATIENT_CLINIC_OR_DEPARTMENT_OTHER): Payer: Self-pay | Admitting: Obstetrics & Gynecology

## 2021-11-21 NOTE — Progress Notes (Deleted)
Office Visit Note  Patient: Brianna [name]             Date of Birth: 04-May-1966           MRN: [id number]             PCP: Glenis Smoker, MD Referring: Glenis Smoker, * Visit Date: 11/30/2021 Occupation: _0 @  Subjective:  No chief complaint on file.   History of Present Illness: Brianna [name] is a 56 y.o. female ***   Activities of Daily Living:  Patient reports morning stiffness for *** {minute/hour:19697}.   Patient {ACTIONS;DENIES/REPORTS:21021675::"Denies"} nocturnal pain.  Difficulty dressing/grooming: {ACTIONS;DENIES/REPORTS:21021675::"Denies"} Difficulty climbing stairs: {ACTIONS;DENIES/REPORTS:21021675::"Denies"} Difficulty getting out of chair: {ACTIONS;DENIES/REPORTS:21021675::"Denies"} Difficulty using hands for taps, buttons, cutlery, and/or writing: {ACTIONS;DENIES/REPORTS:21021675::"Denies"}  No Rheumatology ROS completed.   PMFS History:  Patient Active Problem List   Diagnosis Date Noted   Elevated alkaline phosphatase level 09/01/2021   Cracked lips 07/04/2020   Deviated septum 04/08/2019   Chronic nasal congestion 04/08/2019   Hypertrophy of both inferior nasal turbinates 04/08/2019   Myofascial pain syndrome 08/17/2016   HLA B27 (HLA B27 positive) 08/17/2016   Trochanteric bursitis of both hips 08/17/2016   History of migraine 08/17/2016   Primary osteoarthritis of both hands 08/17/2016   Primary osteoarthritis of both feet 08/17/2016   Plantar fasciitis 08/17/2016   Hyperparathyroidism ([place]) 09/20/2015   High risk HPV infection 05/19/2013    Past Medical History:  Diagnosis Date   Arthritis    Bursitis of hip    bilateral   Chronic headaches    Deviated nasal septum    Factor V Leiden ([place])    Fibromyalgia    HPV test positive 03/2012   neg Pap, +16/18 HPV   Mitral valve prolapse    Molluscum contagiosum    Myofascial pain 2013   Dr. Estanislado Pandy   Obesity    Osteopenia    Plantar fasciitis    Seizures ([facility])     Tendonitis of elbow, right     Family History  Problem Relation Age of Onset   COPD Mother    Rheum arthritis Mother    Hyperlipidemia Mother    Heart attack Mother        01/2016   Colon polyps Mother    Factor V Leiden deficiency Mother    Diabetes Father    Hypertension Father    Hyperlipidemia Father    Parkinson's disease Father    Colon polyps Sister 72       pre-cancerous polyps-had surgery   Heart disease Sister    Olanda Boughner' disease Sister    Hashimoto's thyroiditis Sister    Factor V Leiden deficiency Sister    Breast cancer Maternal Aunt    Stomach cancer Paternal Aunt    Factor V Leiden deficiency Son    Hyperparathyroidism Neg Hx    Past Surgical History:  Procedure Laterality Date   CESAREAN SECTION  1997, 2000   TEMPOROMANDIBULAR JOINT SURGERY Left 1988   WISDOM TOOTH EXTRACTION     Social History   Social History Narrative   Patient is right-handed. She lives with her husband in a 2 level home. She drinks tea 1-2 x a day. And an occasional soda. She walks most days for 30 minutes.   Immunization History  Administered Date(s) Administered   Influenza,inj,Quad PF,6+ Mos 04/22/2019   Influenza-Unspecified 05/18/2020, 05/20/2021   Moderna Sars-Covid-2 Vaccination 07/29/2019, 08/26/2019, 06/29/2020   Pfizer Covid-19 Vaccine Bivalent Booster 20yr & up 04/07/2021  Tdap 07/30/2015   Zoster Recombinat (Shingrix) 08/24/2018     Objective: Vital Signs: LMP 12/22/2012    Physical Exam   Musculoskeletal Exam: ***  CDAI Exam: CDAI Score: -- Patient Global: --; Provider Global: -- Swollen: --; Tender: -- Joint Exam 11/30/2021   No joint exam has been documented for this visit   There is currently no information documented on the homunculus. Go to the Rheumatology activity and complete the homunculus joint exam.  Investigation: No additional findings.  Imaging: No results found.  Recent Labs: Lab Results  Component Value Date   WBC 4.6  06/20/2021   HGB 13.7 06/20/2021   PLT 253 06/20/2021   NA 143 09/01/2021   K 4.5 09/01/2021   CL 106 09/01/2021   CO2 32 09/01/2021   GLUCOSE 91 09/01/2021   BUN 14 09/01/2021   CREATININE 0.70 09/01/2021   BILITOT 0.8 09/21/2021   ALKPHOS 155 (H) 09/21/2021   AST 16 09/21/2021   ALT 13 09/21/2021   PROT 7.7 09/21/2021   ALBUMIN 4.5 09/21/2021   CALCIUM 9.5 09/01/2021   GFRAA 110 05/16/2019    Speciality Comments: No specialty comments available.  Procedures:  No procedures performed Allergies: Z-pak [azithromycin], Amoxicillin-pot clavulanate, and Sulfa antibiotics   Assessment / Plan:     Visit Diagnoses: No diagnosis found.  Orders: No orders of the defined types were placed in this encounter.  No orders of the defined types were placed in this encounter.   Face-to-face time spent with patient was *** minutes. Greater than 50% of time was spent in counseling and coordination of care.  Follow-Up Instructions: No follow-ups on file.   Earnestine Mealing, CMA  Note - This record has been created using Editor, commissioning.  Chart creation errors have been sought, but may not always  have been located. Such creation errors do not reflect on  the standard of medical care.

## 2021-11-22 ENCOUNTER — Ambulatory Visit: Payer: 59 | Admitting: Physician Assistant

## 2021-11-22 DIAGNOSIS — M7918 Myalgia, other site: Secondary | ICD-10-CM

## 2021-11-22 DIAGNOSIS — Z8669 Personal history of other diseases of the nervous system and sense organs: Secondary | ICD-10-CM

## 2021-11-22 DIAGNOSIS — M7061 Trochanteric bursitis, right hip: Secondary | ICD-10-CM

## 2021-11-22 DIAGNOSIS — M722 Plantar fascial fibromatosis: Secondary | ICD-10-CM

## 2021-11-22 DIAGNOSIS — E559 Vitamin D deficiency, unspecified: Secondary | ICD-10-CM

## 2021-11-22 DIAGNOSIS — M19072 Primary osteoarthritis, left ankle and foot: Secondary | ICD-10-CM

## 2021-11-22 DIAGNOSIS — Z1589 Genetic susceptibility to other disease: Secondary | ICD-10-CM

## 2021-11-22 DIAGNOSIS — E213 Hyperparathyroidism, unspecified: Secondary | ICD-10-CM

## 2021-11-22 DIAGNOSIS — M62838 Other muscle spasm: Secondary | ICD-10-CM

## 2021-11-22 DIAGNOSIS — M19041 Primary osteoarthritis, right hand: Secondary | ICD-10-CM

## 2021-11-23 ENCOUNTER — Encounter (HOSPITAL_BASED_OUTPATIENT_CLINIC_OR_DEPARTMENT_OTHER): Payer: Self-pay | Admitting: Obstetrics & Gynecology

## 2021-11-24 ENCOUNTER — Ambulatory Visit (INDEPENDENT_AMBULATORY_CARE_PROVIDER_SITE_OTHER)
Admission: RE | Admit: 2021-11-24 | Discharge: 2021-11-24 | Disposition: A | Discharge status: Home / Self Care | Payer: Self-pay | Source: Ambulatory Visit | Attending: Cardiovascular Disease | Admitting: Cardiovascular Disease

## 2021-11-24 DIAGNOSIS — E7800 Pure hypercholesterolemia, unspecified: Secondary | ICD-10-CM

## 2021-11-25 ENCOUNTER — Other Ambulatory Visit: Payer: Self-pay | Admitting: Physician Assistant

## 2021-11-25 NOTE — Telephone Encounter (Signed)
Next Visit: 11/30/2021 ? ?Last Visit: 05/17/2021 ? ?UDS:05/17/2021, UDS is consistent with treatment.  ? ?Narc Agreement: 05/15/2021 ? ?Last Fill: 09/12/2021 ? ?Okay to refill Tramadol? ? ?

## 2021-11-30 ENCOUNTER — Ambulatory Visit: Payer: 59 | Admitting: Physician Assistant

## 2021-11-30 DIAGNOSIS — M19071 Primary osteoarthritis, right ankle and foot: Secondary | ICD-10-CM

## 2021-11-30 DIAGNOSIS — M62838 Other muscle spasm: Secondary | ICD-10-CM

## 2021-11-30 DIAGNOSIS — M722 Plantar fascial fibromatosis: Secondary | ICD-10-CM

## 2021-11-30 DIAGNOSIS — M7061 Trochanteric bursitis, right hip: Secondary | ICD-10-CM

## 2021-11-30 DIAGNOSIS — E213 Hyperparathyroidism, unspecified: Secondary | ICD-10-CM

## 2021-11-30 DIAGNOSIS — Z1589 Genetic susceptibility to other disease: Secondary | ICD-10-CM

## 2021-11-30 DIAGNOSIS — Z8669 Personal history of other diseases of the nervous system and sense organs: Secondary | ICD-10-CM

## 2021-11-30 DIAGNOSIS — Z5181 Encounter for therapeutic drug level monitoring: Secondary | ICD-10-CM

## 2021-11-30 DIAGNOSIS — E559 Vitamin D deficiency, unspecified: Secondary | ICD-10-CM

## 2021-11-30 DIAGNOSIS — M7918 Myalgia, other site: Secondary | ICD-10-CM

## 2021-11-30 DIAGNOSIS — M19042 Primary osteoarthritis, left hand: Secondary | ICD-10-CM

## 2021-11-30 NOTE — Progress Notes (Deleted)
Office Visit Note  Patient: Brianna [name]             Date of Birth: 10/28/1965           MRN: [id number]             PCP: Glenis Smoker, MD Referring: Glenis Smoker, * Visit Date: 12/07/2021 Occupation: @GUAROCC @  Subjective:    History of Present Illness: Brianna [name] is a 56 y.o. female with history of myofascial pain and osteoarthritis. She takes robaxin 500 mg 1 tablet twice daily as needed and tramadol 50 mg 1 tablet daily as needed for pain relief.   Activities of Daily Living:  Patient reports morning stiffness for *** {minute/hour:19697}.   Patient {ACTIONS;DENIES/REPORTS:21021675::"Denies"} nocturnal pain.  Difficulty dressing/grooming: {ACTIONS;DENIES/REPORTS:21021675::"Denies"} Difficulty climbing stairs: {ACTIONS;DENIES/REPORTS:21021675::"Denies"} Difficulty getting out of chair: {ACTIONS;DENIES/REPORTS:21021675::"Denies"} Difficulty using hands for taps, buttons, cutlery, and/or writing: {ACTIONS;DENIES/REPORTS:21021675::"Denies"}  No Rheumatology ROS completed.   PMFS History:  Patient Active Problem List   Diagnosis Date Noted   Elevated alkaline phosphatase level 09/01/2021   Cracked lips 07/04/2020   Deviated septum 04/08/2019   Chronic nasal congestion 04/08/2019   Hypertrophy of both inferior nasal turbinates 04/08/2019   Myofascial pain syndrome 08/17/2016   HLA B27 (HLA B27 positive) 08/17/2016   Trochanteric bursitis of both hips 08/17/2016   History of migraine 08/17/2016   Primary osteoarthritis of both hands 08/17/2016   Primary osteoarthritis of both feet 08/17/2016   Plantar fasciitis 08/17/2016   Hyperparathyroidism ([place]) 09/20/2015   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 Estanislado Pandy   Obesity    Osteopenia    Plantar fasciitis    Seizures ([facility])    Tendonitis of elbow, right     Family History  Problem Relation Age of Onset   COPD Mother    Rheum arthritis Mother    Hyperlipidemia Mother    Heart attack Mother        01/2016   Colon polyps Mother    Factor V Leiden deficiency Mother    Diabetes Father    Hypertension Father    Hyperlipidemia Father    Parkinson's disease Father    Colon polyps Sister 81       pre-cancerous polyps-had surgery   Heart disease Sister    Graves' disease Sister    Hashimoto's thyroiditis Sister    Factor V Leiden deficiency Sister    Breast cancer Maternal Aunt    Stomach cancer Paternal Aunt    Factor V Leiden deficiency Son    Hyperparathyroidism Neg Hx    Past Surgical History:  Procedure Laterality Date   CESAREAN SECTION  1997, 2000   TEMPOROMANDIBULAR JOINT SURGERY Left 1988   WISDOM TOOTH EXTRACTION     Social History   Social History Narrative   Patient is right-handed. She lives with her husband in a 2 level home. She drinks tea 1-2 x a day. And an occasional soda. She walks most days for 30 minutes.   Immunization History  Administered Date(s) Administered   Influenza,inj,Quad PF,6+ Mos 04/22/2019  Influenza-Unspecified 05/18/2020, 05/20/2021   Moderna Sars-Covid-2 Vaccination 07/29/2019, 08/26/2019, 06/29/2020   Pfizer Covid-19 Vaccine Bivalent Booster 103yrs & up 04/07/2021   Tdap 07/30/2015   Zoster Recombinat (Shingrix) 08/24/2018     Objective: Vital Signs: LMP 12/22/2012    Physical Exam Vitals and nursing note reviewed.  Constitutional:      Appearance: She is well-developed.  HENT:     Head: Normocephalic and atraumatic.  Eyes:     Conjunctiva/sclera: Conjunctivae normal.  Cardiovascular:     Rate and Rhythm: Normal rate and regular rhythm.     Heart sounds: Normal heart sounds.  Pulmonary:     Effort: Pulmonary effort  is normal.     Breath sounds: Normal breath sounds.  Abdominal:     General: Bowel sounds are normal.     Palpations: Abdomen is soft.  Musculoskeletal:     Cervical back: Normal range of motion.  Lymphadenopathy:     Cervical: No cervical adenopathy.  Skin:    General: Skin is warm and dry.     Capillary Refill: Capillary refill takes less than 2 seconds.  Neurological:     Mental Status: She is alert and oriented to person, place, and time.  Psychiatric:        Behavior: Behavior normal.     Musculoskeletal Exam: ***  CDAI Exam: CDAI Score: -- Patient Global: --; Provider Global: -- Swollen: --; Tender: -- Joint Exam 12/07/2021   No joint exam has been documented for this visit   There is currently no information documented on the homunculus. Go to the Rheumatology activity and complete the homunculus joint exam.  Investigation: No additional findings.  Imaging: CT CARDIAC SCORING (SELF PAY ONLY)  Addendum Date: 11/24/2021   ADDENDUM REPORT: 11/24/2021 18:53 ADDENDUM: Cardiovascular Disease Risk stratification EXAM: Coronary Calcium Score TECHNIQUE: A gated, non-contrast computed tomography scan of the heart was performed using 50mm slice thickness. Axial images were analyzed on a dedicated workstation. Calcium scoring of the coronary arteries was performed using the Agatston method. FINDINGS: Coronary arteries: Normal origins. Coronary Calcium Score: Left main: 0 Left anterior descending artery: 0 Left circumflex artery: 0 Right coronary artery: 0 Total: 0 Percentile: 0 Pericardium: Normal. Ascending Aorta: Normal caliber. Non-cardiac: See separate report from [facility]. IMPRESSION: Coronary calcium score of 0. This was 0 percentile for age-, race-, and sex-matched controls. RECOMMENDATIONS: Coronary artery calcium (CAC) score is a strong predictor of incident coronary heart disease (CHD) and provides predictive information beyond traditional risk factors. CAC scoring  is reasonable to use in the decision to withhold, postpone, or initiate statin therapy in intermediate-risk or selected borderline-risk asymptomatic adults (age 41-75 years and LDL-C >=70 to <190 mg/dL) who do not have diabetes or established atherosclerotic cardiovascular disease (ASCVD).* In intermediate-risk (10-year ASCVD risk >=7.5% to <20%) adults or selected borderline-risk (10-year ASCVD risk >=5% to <7.5%) adults in whom a CAC score is measured for the purpose of making a treatment decision the following recommendations have been made: If CAC=0, it is reasonable to withhold statin therapy and reassess in 5 to 10 years, as long as higher risk conditions are absent (diabetes mellitus, family history of premature CHD in first degree relatives (males <55 years; females <65 years), cigarette smoking, or LDL >=190 mg/dL). If CAC is 1 to 99, it is reasonable to initiate statin therapy for patients >=43 years of age. If CAC is >=100 or >=75th percentile, it is reasonable to initiate statin therapy at any age. Cardiology referral should be considered  for patients with CAC scores >=400 or >=75th percentile. *2018 AHA/ACC/AACVPR/AAPA/ABC/ACPM/ADA/AGS/APhA/ASPC/NLA/PCNA Guideline on the Management of Blood Cholesterol: A Report of the American College of Cardiology/American Heart Association Task Force on Clinical Practice Guidelines. J Am Coll Cardiol. 2019;73(24):3168-3209. Berniece Salines, DO The noncardiac portion of this study will be interpreted in separate report by the radiologist. Electronically Signed   By: Berniece Salines D.O.   On: 11/24/2021 18:53   Result Date: 11/24/2021 CLINICAL DATA:  This over-read does not include interpretation of cardiac or coronary anatomy or pathology. The coronary calcium score interpretation by the cardiologist is attached. COMPARISON:  None Available. FINDINGS: Within the visualized portions of the thorax there are no suspicious appearing pulmonary nodules or masses, there is no  acute consolidative airspace disease, no pleural effusions, no pneumothorax and no lymphadenopathy. Visualized portions of the upper abdomen are unremarkable. There are no aggressive appearing lytic or blastic lesions noted in the visualized portions of the skeleton. IMPRESSION: 1. No significant incidental noncardiac findings are noted. Electronically Signed: By: Vinnie Langton M.D. On: 11/24/2021 09:02    Recent Labs: Lab Results  Component Value Date   WBC 4.6 06/20/2021   HGB 13.7 06/20/2021   PLT 253 06/20/2021   NA 143 09/01/2021   K 4.5 09/01/2021   CL 106 09/01/2021   CO2 32 09/01/2021   GLUCOSE 91 09/01/2021   BUN 14 09/01/2021   CREATININE 0.70 09/01/2021   BILITOT 0.8 09/21/2021   ALKPHOS 155 (H) 09/21/2021   AST 16 09/21/2021   ALT 13 09/21/2021   PROT 7.7 09/21/2021   ALBUMIN 4.5 09/21/2021   CALCIUM 9.5 09/01/2021   GFRAA 110 05/16/2019    Speciality Comments: No specialty comments available.  Procedures:  No procedures performed Allergies: Z-pak [azithromycin], Amoxicillin-pot clavulanate, and Sulfa antibiotics   Assessment / Plan:     Visit Diagnoses: No diagnosis found.  Orders: No orders of the defined types were placed in this encounter.  No orders of the defined types were placed in this encounter.   Face-to-face time spent with patient was *** minutes. Greater than 50% of time was spent in counseling and coordination of care.  Follow-Up Instructions: No follow-ups on file.   Earnestine Mealing, CMA  Note - This record has been created using Editor, commissioning.  Chart creation errors have been sought, but may not always  have been located. Such creation errors do not reflect on  the standard of medical care.

## 2021-12-01 ENCOUNTER — Ambulatory Visit (HOSPITAL_BASED_OUTPATIENT_CLINIC_OR_DEPARTMENT_OTHER): Admission: RE | Admit: 2021-12-01 | Payer: 59 | Source: Ambulatory Visit

## 2021-12-03 ENCOUNTER — Ambulatory Visit (HOSPITAL_BASED_OUTPATIENT_CLINIC_OR_DEPARTMENT_OTHER)
Admission: RE | Admit: 2021-12-03 | Discharge: 2021-12-03 | Disposition: A | Discharge status: Home / Self Care | Payer: 59 | Source: Ambulatory Visit | Attending: Obstetrics & Gynecology | Admitting: Obstetrics & Gynecology

## 2021-12-03 DIAGNOSIS — K824 Cholesterolosis of gallbladder: Secondary | ICD-10-CM | POA: Insufficient documentation

## 2021-12-06 ENCOUNTER — Other Ambulatory Visit (HOSPITAL_BASED_OUTPATIENT_CLINIC_OR_DEPARTMENT_OTHER): Payer: Self-pay | Admitting: *Deleted

## 2021-12-06 DIAGNOSIS — K824 Cholesterolosis of gallbladder: Secondary | ICD-10-CM

## 2021-12-06 NOTE — Progress Notes (Signed)
Referral placed to [place] for treatment of gallbladder polyps ?

## 2021-12-07 ENCOUNTER — Ambulatory Visit: Payer: 59 | Admitting: Physician Assistant

## 2021-12-07 DIAGNOSIS — Z5181 Encounter for therapeutic drug level monitoring: Secondary | ICD-10-CM

## 2021-12-07 DIAGNOSIS — E559 Vitamin D deficiency, unspecified: Secondary | ICD-10-CM

## 2021-12-07 DIAGNOSIS — E213 Hyperparathyroidism, unspecified: Secondary | ICD-10-CM

## 2021-12-07 DIAGNOSIS — M19041 Primary osteoarthritis, right hand: Secondary | ICD-10-CM

## 2021-12-07 DIAGNOSIS — M7918 Myalgia, other site: Secondary | ICD-10-CM

## 2021-12-07 DIAGNOSIS — Z1589 Genetic susceptibility to other disease: Secondary | ICD-10-CM

## 2021-12-07 DIAGNOSIS — M722 Plantar fascial fibromatosis: Secondary | ICD-10-CM

## 2021-12-07 DIAGNOSIS — Z8669 Personal history of other diseases of the nervous system and sense organs: Secondary | ICD-10-CM

## 2021-12-07 DIAGNOSIS — M62838 Other muscle spasm: Secondary | ICD-10-CM

## 2021-12-07 DIAGNOSIS — M7061 Trochanteric bursitis, right hip: Secondary | ICD-10-CM

## 2021-12-07 DIAGNOSIS — M19072 Primary osteoarthritis, left ankle and foot: Secondary | ICD-10-CM

## 2021-12-26 ENCOUNTER — Encounter (HOSPITAL_BASED_OUTPATIENT_CLINIC_OR_DEPARTMENT_OTHER): Payer: Self-pay | Admitting: Obstetrics & Gynecology

## 2022-01-03 ENCOUNTER — Other Ambulatory Visit: Payer: Self-pay | Admitting: Surgery

## 2022-01-03 ENCOUNTER — Encounter: Payer: Self-pay | Admitting: Physician Assistant

## 2022-01-03 ENCOUNTER — Ambulatory Visit: Payer: 59 | Admitting: Physician Assistant

## 2022-01-03 VITALS — BP 104/72 | HR 56 | Resp 13 | Ht 69.0 in | Wt 223.8 lb

## 2022-01-03 DIAGNOSIS — M7918 Myalgia, other site: Secondary | ICD-10-CM | POA: Diagnosis not present

## 2022-01-03 DIAGNOSIS — M7061 Trochanteric bursitis, right hip: Secondary | ICD-10-CM

## 2022-01-03 DIAGNOSIS — M62838 Other muscle spasm: Secondary | ICD-10-CM

## 2022-01-03 DIAGNOSIS — M7062 Trochanteric bursitis, left hip: Secondary | ICD-10-CM

## 2022-01-03 DIAGNOSIS — Z1589 Genetic susceptibility to other disease: Secondary | ICD-10-CM

## 2022-01-03 DIAGNOSIS — K824 Cholesterolosis of gallbladder: Secondary | ICD-10-CM

## 2022-01-03 DIAGNOSIS — Z8669 Personal history of other diseases of the nervous system and sense organs: Secondary | ICD-10-CM

## 2022-01-03 DIAGNOSIS — M19041 Primary osteoarthritis, right hand: Secondary | ICD-10-CM | POA: Diagnosis not present

## 2022-01-03 DIAGNOSIS — M19072 Primary osteoarthritis, left ankle and foot: Secondary | ICD-10-CM

## 2022-01-03 DIAGNOSIS — Z5181 Encounter for therapeutic drug level monitoring: Secondary | ICD-10-CM

## 2022-01-03 DIAGNOSIS — E213 Hyperparathyroidism, unspecified: Secondary | ICD-10-CM

## 2022-01-03 DIAGNOSIS — M19071 Primary osteoarthritis, right ankle and foot: Secondary | ICD-10-CM

## 2022-01-03 DIAGNOSIS — M722 Plantar fascial fibromatosis: Secondary | ICD-10-CM

## 2022-01-03 DIAGNOSIS — E559 Vitamin D deficiency, unspecified: Secondary | ICD-10-CM

## 2022-01-03 DIAGNOSIS — M19042 Primary osteoarthritis, left hand: Secondary | ICD-10-CM

## 2022-01-03 NOTE — Progress Notes (Signed)
Office Visit Note  Patient: Brianna [name]             Date of Birth: 1965/08/26           MRN: [id number]             PCP: Glenis Smoker, MD Referring: Glenis Smoker, * Visit Date: 01/03/2022 Occupation: _0 @  Subjective:  Trapezius muscle tension and tenderness  History of Present Illness: Brianna [name] is a 56 y.o. female with history of myofascial pain and osteoarthritis.  She has intermittent myalgias and muscle tenderness due to myofascial pain.  She has trapezius muscle tension and tenderness bilaterally.  She has ongoing discomfort due to trochanter bursitis of both hips.  She has interrupted sleep at night due to having to change positions frequently.  She has been sleeping 4 to 6 hours per night.  She continues to have daytime fatigue.  She typically only takes methocarbamol and tramadol during flares.  She denies any joint swelling currently. She has established care with a nutritionist and plans on trying to work on weight loss.   Activities of Daily Living:  Patient reports morning stiffness for 3 hours.   Patient Reports nocturnal pain.  Difficulty dressing/grooming: Denies Difficulty climbing stairs: Denies Difficulty getting out of chair: Denies Difficulty using hands for taps, buttons, cutlery, and/or writing: Denies  Review of Systems  Constitutional:  Positive for fatigue.  HENT:  Negative for mouth sores, mouth dryness and nose dryness.   Eyes:  Negative for pain, visual disturbance and dryness.  Respiratory:  Negative for cough, hemoptysis, shortness of breath and difficulty breathing.   Cardiovascular:  Positive for swelling in legs/feet. Negative for chest pain, palpitations and hypertension.  Gastrointestinal:  Negative for blood in stool, constipation and diarrhea.  Endocrine: Positive for heat intolerance. Negative for increased urination.  Genitourinary:  Negative for difficulty urinating and painful urination.  Musculoskeletal:   Positive for joint pain, joint pain, joint swelling, muscle weakness, morning stiffness and muscle tenderness. Negative for myalgias and myalgias.  Skin:  Negative for color change, pallor, rash, hair loss, nodules/bumps, skin tightness, ulcers and sensitivity to sunlight.  Allergic/Immunologic: Negative for susceptible to infections.  Neurological:  Positive for weakness. Negative for dizziness, numbness and headaches.  Hematological:  Positive for bruising/bleeding tendency. Negative for swollen glands.  Psychiatric/Behavioral:  Positive for sleep disturbance. Negative for depressed mood. The patient is not nervous/anxious.     PMFS History:  Patient Active Problem List   Diagnosis Date Noted   Elevated alkaline phosphatase level 09/01/2021   Cracked lips 07/04/2020   Deviated septum 04/08/2019   Chronic nasal congestion 04/08/2019   Hypertrophy of both inferior nasal turbinates 04/08/2019   Myofascial pain syndrome 08/17/2016   HLA B27 (HLA B27 positive) 08/17/2016   Trochanteric bursitis of both hips 08/17/2016   History of migraine 08/17/2016   Primary osteoarthritis of both hands 08/17/2016   Primary osteoarthritis of both feet 08/17/2016   Plantar fasciitis 08/17/2016   Hyperparathyroidism ([place]) 09/20/2015   High risk HPV infection 05/19/2013    Past Medical History:  Diagnosis Date   Arthritis    Bursitis of hip    bilateral   Chronic headaches    Deviated nasal septum    Factor V Leiden ([facility])    Fibromyalgia    Gall bladder polyp    x3   HPV test positive 03/2012   neg Pap, +16/18 HPV   Mitral valve prolapse    Molluscum contagiosum  Myofascial pain 2013   Dr. Estanislado Pandy   Obesity    Osteopenia    Plantar fasciitis    Seizures ([facility])    Tendonitis of elbow, right     Family History  Problem Relation Age of Onset   COPD Mother    Rheum arthritis Mother    Hyperlipidemia Mother    Heart attack Mother        01/2016   Colon polyps Mother    Factor V  Leiden deficiency Mother    Diabetes Father    Hypertension Father    Hyperlipidemia Father    Parkinson's disease Father    Colon polyps Sister 77       pre-cancerous polyps-had surgery   Heart disease Sister    Graves' disease Sister    Hashimoto's thyroiditis Sister    Factor V Leiden deficiency Sister    Breast cancer Maternal Aunt    Stomach cancer Paternal Aunt    Factor V Leiden deficiency Son    Hyperparathyroidism Neg Hx    Past Surgical History:  Procedure Laterality Date   CESAREAN SECTION  1997, 2000   TEMPOROMANDIBULAR JOINT SURGERY Left 1988   WISDOM TOOTH EXTRACTION     Social History   Social History Narrative   Patient is right-handed. She lives with her husband in a 2 level home. She drinks tea 1-2 x a day. And an occasional soda. She walks most days for 30 minutes.   Immunization History  Administered Date(s) Administered   Influenza,inj,Quad PF,6+ Mos 04/22/2019   Influenza-Unspecified 05/18/2020, 05/20/2021   Moderna Sars-Covid-2 Vaccination 07/29/2019, 08/26/2019, 06/29/2020   Pfizer Covid-19 Vaccine Bivalent Booster 19yr & up 04/07/2021   Tdap 07/30/2015   Zoster Recombinat (Shingrix) 08/24/2018     Objective: Vital Signs: BP 104/72 (BP Location: Left Arm, Patient Position: Sitting, Cuff Size: Large)   Pulse (!) 56   Resp 13   Ht 5' 9" (1.753 m)   Wt 223 lb 12.8 oz (101.5 kg)   LMP 12/22/2012   BMI 33.05 kg/m    Physical Exam Vitals and nursing note reviewed.  Constitutional:      Appearance: She is well-developed.  HENT:     Head: Normocephalic and atraumatic.  Eyes:     Conjunctiva/sclera: Conjunctivae normal.  Cardiovascular:     Rate and Rhythm: Normal rate and regular rhythm.     Heart sounds: Normal heart sounds.  Pulmonary:     Effort: Pulmonary effort is normal.     Breath sounds: Normal breath sounds.  Abdominal:     General: Bowel sounds are normal.     Palpations: Abdomen is soft.  Musculoskeletal:     Cervical back:  Normal range of motion.  Skin:    General: Skin is warm and dry.     Capillary Refill: Capillary refill takes less than 2 seconds.  Neurological:     Mental Status: She is alert and oriented to person, place, and time.  Psychiatric:        Behavior: Behavior normal.      Musculoskeletal Exam: C-spine has good range of motion.  Trapezius muscle tension and tenderness bilaterally.  No midline spinal tenderness or SI joint tenderness.  Shoulder joints, elbow joints, wrist joints, MCPs, PIPs, DIPs have good range of motion with no synovitis.  Complete this formation bilaterally.  Hip joints have good range of motion with no groin pain.  Tenderness palpation over bilateral trochanteric bursa left greater than right.  Knee joints have good range  of motion with no warmth or effusion.  Ankle joints have good range of motion with no tenderness or joint swelling.    CDAI Exam: CDAI Score: -- Patient Global: --; Provider Global: -- Swollen: --; Tender: -- Joint Exam 01/03/2022   No joint exam has been documented for this visit   There is currently no information documented on the homunculus. Go to the Rheumatology activity and complete the homunculus joint exam.  Investigation: No additional findings.  Imaging: No results found.  Recent Labs: Lab Results  Component Value Date   WBC 4.6 06/20/2021   HGB 13.7 06/20/2021   PLT 253 06/20/2021   NA 143 09/01/2021   K 4.5 09/01/2021   CL 106 09/01/2021   CO2 32 09/01/2021   GLUCOSE 91 09/01/2021   BUN 14 09/01/2021   CREATININE 0.70 09/01/2021   BILITOT 0.8 09/21/2021   ALKPHOS 155 (H) 09/21/2021   AST 16 09/21/2021   ALT 13 09/21/2021   PROT 7.7 09/21/2021   ALBUMIN 4.5 09/21/2021   CALCIUM 9.5 09/01/2021   GFRAA 110 05/16/2019    Speciality Comments: No specialty comments available.  Procedures:  No procedures performed Allergies: Z-pak [azithromycin], Amoxicillin-pot clavulanate, and Sulfa antibiotics   Assessment / Plan:      Visit Diagnoses: Myofascial pain syndrome -She continues to experience intermittent myalgias and muscle tenderness consistent with myofascial pain syndrome.  She is having trapezius muscle tension and tenderness bilaterally and has ongoing discomfort due to trochanter bursitis of both hips.  She has interrupted sleep at night due to nocturnal pain.  She has been sleeping about 4 to 6 hours per night.  She takes methocarbamol 500 mg twice daily as needed and tramadol 50 mg at bedtime as needed during flares.  UDS and narcotic agreement were updated today on 01/03/2022.  Discussed the importance of regular exercise and good sleep hygiene.  She declined trigger point injections today.  A referral to integrative therapies was placed today to try to alleviate some of her myofascial pain.  She will follow-up in the office in 6 months or sooner if needed.  Plan: Ambulatory referral to Physical Therapy  Trapezius muscle spasm -She has trapezius muscle tension and tenderness bilaterally.  She has been experiencing muscle spasms intermittently.  She takes methocarbamol 500 mg twice daily as needed during flares.  She declined trigger point injections today due to inadequate response in the past.  Referral to integrative therapies was placed today to discuss other treatment options including possible dry needling.  Plan: Ambulatory referral to Physical Therapy  Primary osteoarthritis of both hands: She has PIP and DIP thickening consistent with osteoarthritis of both hands.  No tenderness or synovitis was noted.  Complete fist formation bilaterally.  Trochanteric bursitis of both hips -She has good range of motion of both hip joints with no groin pain.  She has tenderness palpation over bilateral trochanteric bursa, left greater than right.  She experiences interrupted sleep at night due to nocturnal pain when lying on her sides.  Discussed the importance of performing stretching exercises daily.  She declined  cortisone injection today.  A referral to physical therapy was placed today to integrative therapies.   Plan: Ambulatory referral to Physical Therapy  Primary osteoarthritis of both feet: She is not experiencing any increased discomfort in her feet at this time.  She has good range of motion of both ankle joints with no tenderness or synovitis.  Plantar fasciitis: Resolved.   Medication monitoring encounter -UDS and narcotic  agreement were updated today on 01/03/2022.  Plan: DRUG MONITOR, PANEL 5, W/CONF, URINE, DRUG MONITOR, TRAMADOL,QN, URINE  HLA B27 (HLA B27 positive)  Other medical conditions are listed as follows:  Vitamin D deficiency: She is taking vitamin D 5000 units every 14 days.  Hyperparathyroidism ([place])  History of migraine  Orders: Orders Placed This Encounter  Procedures   DRUG MONITOR, PANEL 5, W/CONF, URINE   DRUG MONITOR, TRAMADOL,QN, URINE   Ambulatory referral to Physical Therapy   No orders of the defined types were placed in this encounter.  Follow-Up Instructions: Return in about 6 months (around 07/05/2022) for Myofascial pain, Osteoarthritis.   Ofilia Neas, PA-C  Note - This record has been created using Dragon software.  Chart creation errors have been sought, but may not always  have been located. Such creation errors do not reflect on  the standard of medical care.

## 2022-01-05 LAB — DRUG MONITOR, PANEL 5, W/CONF, URINE
Amphetamines: NEGATIVE ng/mL (ref <500)
Barbiturates: NEGATIVE ng/mL (ref <300)
Benzodiazepines: NEGATIVE ng/mL (ref <100)
Cocaine Metabolite: NEGATIVE ng/mL (ref <150)
Creatinine: 107.3 mg/dL (ref >=20.0)
Marijuana Metabolite: NEGATIVE ng/mL (ref <20)
Methadone Metabolite: NEGATIVE ng/mL (ref <100)
Opiates: NEGATIVE ng/mL (ref <100)
Oxidant: NEGATIVE ug/mL (ref <200)
Oxycodone: NEGATIVE ng/mL (ref <100)
pH: 6.1 (ref 4.5–9.0)

## 2022-01-05 LAB — DM TEMPLATE

## 2022-01-05 LAB — DRUG MONITOR, TRAMADOL,QN, URINE
Desmethyltramadol: 209 ng/mL — ABNORMAL HIGH (ref <100)
Tramadol: 275 ng/mL — ABNORMAL HIGH (ref <100)

## 2022-01-05 NOTE — Progress Notes (Signed)
UDS is consistent with treatment.

## 2022-02-17 ENCOUNTER — Other Ambulatory Visit: Payer: Self-pay | Admitting: Rheumatology

## 2022-02-17 NOTE — Telephone Encounter (Signed)
Next Visit: 07/05/2022  Last Visit: 01/03/2022  UDS:01/03/2022  Narc Agreement: 01/03/2022  Last Fill: 11/25/2021 tramadol 50 mg at bedtime as needed during flares.  Okay to refill Tramadol?

## 2022-04-12 ENCOUNTER — Encounter: Payer: Self-pay | Admitting: Physician Assistant

## 2022-04-12 ENCOUNTER — Telehealth: Payer: Self-pay | Admitting: Rheumatology

## 2022-04-12 ENCOUNTER — Ambulatory Visit: Payer: 59 | Attending: Physician Assistant | Admitting: Physician Assistant

## 2022-04-12 VITALS — BP 117/80 | HR 59 | Resp 15 | Ht 69.0 in | Wt 213.2 lb

## 2022-04-12 DIAGNOSIS — Z1589 Genetic susceptibility to other disease: Secondary | ICD-10-CM

## 2022-04-12 DIAGNOSIS — M7918 Myalgia, other site: Secondary | ICD-10-CM | POA: Diagnosis not present

## 2022-04-12 DIAGNOSIS — M7062 Trochanteric bursitis, left hip: Secondary | ICD-10-CM

## 2022-04-12 DIAGNOSIS — M62838 Other muscle spasm: Secondary | ICD-10-CM | POA: Diagnosis not present

## 2022-04-12 DIAGNOSIS — E213 Hyperparathyroidism, unspecified: Secondary | ICD-10-CM

## 2022-04-12 DIAGNOSIS — Z8669 Personal history of other diseases of the nervous system and sense organs: Secondary | ICD-10-CM

## 2022-04-12 DIAGNOSIS — M19072 Primary osteoarthritis, left ankle and foot: Secondary | ICD-10-CM

## 2022-04-12 DIAGNOSIS — M7061 Trochanteric bursitis, right hip: Secondary | ICD-10-CM | POA: Diagnosis not present

## 2022-04-12 DIAGNOSIS — E559 Vitamin D deficiency, unspecified: Secondary | ICD-10-CM

## 2022-04-12 DIAGNOSIS — Z5181 Encounter for therapeutic drug level monitoring: Secondary | ICD-10-CM

## 2022-04-12 DIAGNOSIS — M19071 Primary osteoarthritis, right ankle and foot: Secondary | ICD-10-CM

## 2022-04-12 DIAGNOSIS — M19042 Primary osteoarthritis, left hand: Secondary | ICD-10-CM

## 2022-04-12 DIAGNOSIS — M19041 Primary osteoarthritis, right hand: Secondary | ICD-10-CM

## 2022-04-12 DIAGNOSIS — M722 Plantar fascial fibromatosis: Secondary | ICD-10-CM

## 2022-04-12 MED ORDER — LIDOCAINE HCL 1 % IJ SOLN
1.5000 mL | INTRAMUSCULAR | Status: AC | PRN
  Administered 2022-04-12: 1.5 mL

## 2022-04-12 MED ORDER — TRIAMCINOLONE ACETONIDE 40 MG/ML IJ SUSP
40.0000 mg | INTRAMUSCULAR | Status: AC | PRN
  Administered 2022-04-12: 40 mg via INTRA_ARTICULAR

## 2022-04-12 NOTE — Telephone Encounter (Signed)
Patient called stating she is experiencing pain in her hips, neck, and feet.  Patient states she is having difficulty sleeping due to throbbing pain in her neck.  Patient states she is more tender than normal and requested a return call to let her know if she needs to be seen.

## 2022-04-12 NOTE — Telephone Encounter (Signed)
Spoke with patient and scheduled patient for 04/12/2022 at 2:20 pm.

## 2022-04-12 NOTE — Progress Notes (Signed)
 Office Visit Note  Patient: Brianna [name]             Date of Birth: 05/23/1966           MRN: [id number]             PCP: Timberlake, Kathryn S, MD Referring: Timberlake, Kathryn S, * Visit Date: 04/12/2022 Occupation: @GUAROCC@  Subjective:  Generalized pain   History of Present Illness: Brianna [name] is a 56 y.o. female with history of fibromyalgia, osteoarthritis, and DDD.  Patient presents today experiencing generalized pain consistent with a flare.  She states that this flare started about 2 weeks ago with no identifiable trigger.  She states she is having significant discomfort in the trapezius muscles bilaterally as well as discomfort on the lateral aspect of both hips consistent with trochanteric bursitis.  She has been experiencing interrupted sleep at night due to nocturnal pain in her hips.  She is typically a side sleeper so she has been having to change positions frequently to try to get comfortable.  She is also been experiencing muscle spasms in her back intermittently.  She is not taking methocarbamol very sparingly as needed for muscle spasms and tramadol as needed for pain relief.  On the days that she does not take methocarbamol or tramadol she has tried Tylenol and Advil as needed.  She has also tried using a heated massager which provided temporary relief.  She has not yet scheduled an appointment at integrative therapies.  Activities of Daily Living:  Patient reports morning stiffness for 2 hours.   Patient Reports nocturnal pain.  Difficulty dressing/grooming: Denies Difficulty climbing stairs: Denies Difficulty getting out of chair: Denies Difficulty using hands for taps, buttons, cutlery, and/or writing: Denies  Review of Systems  Constitutional:  Positive for fatigue.  HENT:  Negative for mouth sores and mouth dryness.   Eyes:  Negative for dryness.  Respiratory:  Negative for shortness of breath.   Cardiovascular:  Positive for palpitations. Negative for  chest pain.  Gastrointestinal:  Negative for blood in stool, constipation and diarrhea.  Endocrine: Negative for increased urination.  Genitourinary:  Negative for involuntary urination.  Musculoskeletal:  Positive for joint pain, joint pain, joint swelling, myalgias, muscle weakness, morning stiffness, muscle tenderness and myalgias. Negative for gait problem.  Skin:  Negative for color change, rash, hair loss and sensitivity to sunlight.  Allergic/Immunologic: Negative for susceptible to infections.  Neurological:  Positive for headaches. Negative for dizziness.  Hematological:  Negative for swollen glands.  Psychiatric/Behavioral:  Positive for sleep disturbance. Negative for depressed mood. The patient is not nervous/anxious.     PMFS History:  Patient Active Problem List   Diagnosis Date Noted   Elevated alkaline phosphatase level 09/01/2021   Cracked lips 07/04/2020   Deviated septum 04/08/2019   Chronic nasal congestion 04/08/2019   Hypertrophy of both inferior nasal turbinates 04/08/2019   Myofascial pain syndrome 08/17/2016   HLA B27 (HLA B27 positive) 08/17/2016   Trochanteric bursitis of both hips 08/17/2016   History of migraine 08/17/2016   Primary osteoarthritis of both hands 08/17/2016   Primary osteoarthritis of both feet 08/17/2016   Plantar fasciitis 08/17/2016   Hyperparathyroidism (HCC) 09/20/2015   High risk HPV infection 05/19/2013    Past Medical History:  Diagnosis Date   Arthritis    Bursitis of hip    bilateral   Chronic headaches    Deviated nasal septum    Factor V Leiden (HCC)    Fibromyalgia      Gall bladder polyp    x3   HPV test positive 03/2012   neg Pap, +16/18 HPV   Mitral valve prolapse    Molluscum contagiosum    Myofascial pain 2013   Dr. Estanislado Pandy   Obesity    Osteopenia    Plantar fasciitis    Seizures (HCC)    Tendonitis of elbow, right     Family History  Problem Relation Age of Onset   COPD Mother    Rheum arthritis  Mother    Hyperlipidemia Mother    Heart attack Mother        01/2016   Colon polyps Mother    Factor V Leiden deficiency Mother    Diabetes Father    Hypertension Father    Hyperlipidemia Father    Parkinson's disease Father    Colon polyps Sister 52       pre-cancerous polyps-had surgery   Heart disease Sister    Graves' disease Sister    Hashimoto's thyroiditis Sister    Factor V Leiden deficiency Sister    Breast cancer Maternal Aunt    Stomach cancer Paternal Aunt    Factor V Leiden deficiency Son    Hyperparathyroidism Neg Hx    Past Surgical History:  Procedure Laterality Date   CESAREAN SECTION  1997, 2000   TEMPOROMANDIBULAR JOINT SURGERY Left 1988   WISDOM TOOTH EXTRACTION     Social History   Social History Narrative   Patient is right-handed. She lives with her husband in a 2 level home. She drinks tea 1-2 x a day. And an occasional soda. She walks most days for 30 minutes.   Immunization History  Administered Date(s) Administered   Influenza,inj,Quad PF,6+ Mos 04/22/2019   Influenza-Unspecified 05/18/2020, 05/20/2021   Moderna Sars-Covid-2 Vaccination 07/29/2019, 08/26/2019, 06/29/2020   Pfizer Covid-19 Vaccine Bivalent Booster 71yr & up 04/07/2021   Tdap 07/30/2015   Zoster Recombinat (Shingrix) 08/24/2018     Objective: Vital Signs: BP 117/80 (BP Location: Left Arm, Patient Position: Sitting, Cuff Size: Large)   Pulse (!) 59   Resp 15   Ht 5' 9" (1.753 m)   Wt 213 lb 3.2 oz (96.7 kg)   LMP 12/22/2012   BMI 31.48 kg/m    Physical Exam Vitals and nursing note reviewed.  Constitutional:      Appearance: She is well-developed.  HENT:     Head: Normocephalic and atraumatic.  Eyes:     Conjunctiva/sclera: Conjunctivae normal.  Cardiovascular:     Rate and Rhythm: Normal rate and regular rhythm.     Heart sounds: Normal heart sounds.  Pulmonary:     Effort: Pulmonary effort is normal.     Breath sounds: Normal breath sounds.  Abdominal:      General: Bowel sounds are normal.     Palpations: Abdomen is soft.  Musculoskeletal:     Cervical back: Normal range of motion.  Skin:    General: Skin is warm and dry.     Capillary Refill: Capillary refill takes less than 2 seconds.  Neurological:     Mental Status: She is alert and oriented to person, place, and time.  Psychiatric:        Behavior: Behavior normal.      Musculoskeletal Exam: Generalized hyperalgesia and positive tender points on examination.  C-spine has good range of motion with discomfort with lateral Tatian.  Trapezius muscle tension and tenderness bilaterally.  No midline spinal tenderness or SI joint tenderness.  Shoulder joints, elbow joints, wrist  joints, MCPs, PIPs, DIPs have good range of motion with no synovitis.  Complete fist formation bilaterally.  Hip joints have good range of motion with no groin pain.  Tenderness palpation over bilateral trochanteric bursa.  Knee joints have good range of motion with no warmth or effusion.  Ankle joints have good ROM with no tenderness or joint swelling.    CDAI Exam: CDAI Score: -- Patient Global: --; Provider Global: -- Swollen: --; Tender: -- Joint Exam 04/12/2022   No joint exam has been documented for this visit   There is currently no information documented on the homunculus. Go to the Rheumatology activity and complete the homunculus joint exam.  Investigation: No additional findings.  Imaging: No results found.  Recent Labs: Lab Results  Component Value Date   WBC 4.6 06/20/2021   HGB 13.7 06/20/2021   PLT 253 06/20/2021   NA 143 09/01/2021   K 4.5 09/01/2021   CL 106 09/01/2021   CO2 32 09/01/2021   GLUCOSE 91 09/01/2021   BUN 14 09/01/2021   CREATININE 0.70 09/01/2021   BILITOT 0.8 09/21/2021   ALKPHOS 155 (H) 09/21/2021   AST 16 09/21/2021   ALT 13 09/21/2021   PROT 7.7 09/21/2021   ALBUMIN 4.5 09/21/2021   CALCIUM 9.5 09/01/2021   GFRAA 110 05/16/2019    Speciality Comments: No  specialty comments available.  Procedures:  Large Joint Inj: bilateral greater trochanter on 04/12/2022 2:33 PM Indications: pain Details: 27 G 1.5 in needle, lateral approach  Arthrogram: No  Medications (Right): 1.5 mL lidocaine 1 %; 40 mg triamcinolone acetonide 40 MG/ML Aspirate (Right): 0 mL Medications (Left): 1.5 mL lidocaine 1 %; 40 mg triamcinolone acetonide 40 MG/ML Aspirate (Left): 0 mL Outcome: tolerated well, no immediate complications Procedure, treatment alternatives, risks and benefits explained, specific risks discussed. Consent was given by the patient. Immediately prior to procedure a time out was called to verify the correct patient, procedure, equipment, support staff and site/side marked as required. Patient was prepped and draped in the usual sterile fashion.     Allergies: Z-pak [azithromycin], Amoxicillin-pot clavulanate, and Sulfa antibiotics   Assessment / Plan:     Visit Diagnoses: Myofascial pain syndrome - She presents today experiencing symptoms of a flare which started 2 weeks ago.  No change in activity or identifiable trigger for this flare was noted.  She is experiencing trapezius muscle tension and tenderness bilaterally as well as trochanteric bursitis of both hips.  She has tried taking tramadol, methocarbamol, Tylenol, and Advil for pain relief.  She is also tried heat as well as massage which provides minimal to no relief.  Different treatment options were discussed today to treat her current flare.  She requested bilateral trochanteric bursa cortisone injections today.  She tolerated the procedures well.  Procedure notes were completed above.  Aftercare was discussed.  She plans on taking methocarbamol 500 mg twice daily as needed and tramadol 50 mg at bedtime as needed during flares.   Encourage patient to establish care at integrative therapies to help alleviate her generalized hyperalgesia and tender points due to myofascial pain. She will follow-up  in the office in 6 months or sooner if needed.  Trapezius muscle spasm: She presents today with trapezius muscle tension and tenderness bilaterally.  She has been experiencing muscle spasms intermittently.  She takes methocarbamol 500 mg 1 tablet twice daily as needed for muscle spasms.  She took a dose of methocarbamol this morning which provided some relief but has not  resolved her symptoms.  Discussed that during flares she can take methocarbamol twice daily.  She will notify us if she would like to return for trigger point injections in the future.  Primary osteoarthritis of both hands: She has some PIP and DIP thickening consistent with osteoarthritis of both hands.  No tenderness or inflammation noted.  Complete fist formation bilaterally.  She continues to take turmeric and tart cherry for the natural antiinflammatory properties.   Trochanteric bursitis of both hips: She presents today with discomfort on the lateral aspect of both hips.  No recent injury or fall.  Her symptoms have progressively been worsening over the past 2 weeks.  She has had interrupted sleep at night due to being a side sleeper and having to change positions frequently.  Different treatment options were discussed today.  She requested bilateral trochanteric bursa cortisone injections today.  She tolerated the procedure well.  Procedure note was completed above.  Aftercare was discussed.  She was advised to notify us if her symptoms persist or worsen.  Discussed that I would recommend following up with integrative therapies once her symptoms have improved to prevent recurrence of her symptoms.  Primary osteoarthritis of both feet: She experiences intermittent aching in both feet.  She has no synovitis on examination today.  Other medical conditions are listed as follows:   Plantar fasciitis: Resolved.   Medication monitoring encounter - UDS & narcotic agreement: 01/03/2022.  HLA B27 (HLA B27 positive)  Hyperparathyroidism  (HCC)  Vitamin D deficiency: She is taking vitamin D 50,000 units every 14 days.  History of migraine  Orders: Orders Placed This Encounter  Procedures   Large Joint Inj: bilateral greater trochanter   No orders of the defined types were placed in this encounter.    Follow-Up Instructions: Return in about 6 months (around 10/11/2022) for Fibromyalgia.   Ofilia Neas, PA-C  Note - This record has been created using Dragon software.  Chart creation errors have been sought, but may not always  have been located. Such creation errors do not reflect on  the standard of medical care.

## 2022-04-24 ENCOUNTER — Ambulatory Visit
Admission: RE | Admit: 2022-04-24 | Discharge: 2022-04-24 | Disposition: A | Discharge status: Home / Self Care | Payer: 59 | Source: Ambulatory Visit | Attending: Surgery | Admitting: Surgery

## 2022-04-24 ENCOUNTER — Other Ambulatory Visit: Payer: Self-pay | Admitting: Family Medicine

## 2022-04-24 DIAGNOSIS — K824 Cholesterolosis of gallbladder: Secondary | ICD-10-CM

## 2022-04-24 DIAGNOSIS — Z1231 Encounter for screening mammogram for malignant neoplasm of breast: Secondary | ICD-10-CM

## 2022-04-30 ENCOUNTER — Other Ambulatory Visit: Payer: Self-pay | Admitting: Physician Assistant

## 2022-05-01 NOTE — Telephone Encounter (Signed)
Next Visit: 10/11/2022  Last Visit: 04/12/2022  UDS:01/03/2022  Narc Agreement: 01/03/2022  Last Fill:  02/17/2022 tramadol 50 mg at bedtime as needed  Okay to refill Tramadol?

## 2022-05-10 ENCOUNTER — Ambulatory Visit: Payer: Self-pay | Admitting: Surgery

## 2022-05-10 NOTE — H&P (Signed)
Brianna [name] [id number]    Referring Provider:  Garth Bigness, MD     Subjective    Chief Complaint: Follow-up       History of Present Illness: Returns after follow up ultrasound done earlier this month.   Remains asymptomatic, but concerned.   Impression: 1. 2-3 gallbladder polyps are visualized, with the largest measuring 7 mm on today's exam. Morphologically this appears fairly similar to the prior exam where it was measured at 5 mm, potentially secondary to differences in measurement technique. Recommend continued follow-up with ultrasound in 1 year to ensure stability. 2. No acute process. No biliary dilatation.   Last visit May this JJ:Brianna [name] pleasant 56 year old woman with history of ectopic atrial tachycardia/palpitations, seizures, obesity, osteopenia, multiple therapeutic issues, mitral valve prolapse, fibromyalgia, factor V Leiden, chronic headaches referred for gallbladder polyp.  She has 3 polyps noted on ultrasound last month, largest is 5 mm and unchanged from previous exam December 2022.  She has been undergoing a work-up for elevated alkaline phosphatase (which was the reason for the original ultrasound ) and does have a history of vitamin D deficiency causing hyperparathyroidism in 2017.  This is not thought to have been [name] in origin Per notes from Brianna [name] (Dr. Myrtie Neither) noting normal GGT and no biliary dilation on imaging. Previous abdominal surgery includes C-section. She denies any abdominal pain, nausea, or digestive issues that would suggest biliary etiology.     Review of Systems: A complete review of systems was obtained from the patient.  I have reviewed this information and discussed as appropriate with the patient.  See HPI as well for other ROS.     Medical History: Past Medical History      Past Medical History:  Diagnosis Date   Arrhythmia     Arthritis     Seizures (CMS-HCC)          There is no problem list on file for this patient.      Past Surgical History       Past Surgical History:  Procedure Laterality Date   TEMPOROMANDIBULAR JOINT ARTHROPLASTY   1989   CESAREAN SECTION   12/13/1995   REPEAT CESAREAN SECTION   03/14/1999        Allergies      Allergies  Allergen Reactions   Sulfa (Sulfonamide Antibiotics) Rash and Other (See Comments)              Current Outpatient Medications on File Prior to Visit  Medication Sig Dispense Refill   acetaminophen (TYLENOL) 325 MG tablet Take by mouth every 6 (six) hours       azelastine (ASTELIN) 137 mcg nasal spray 1 spray in each nostril       C/sourcherry/celery/grape seed (TART CHERRY ORAL)         co-enzyme Q-10, ubiquinone, 50 mg capsule Take 50 mg by mouth once daily       cyanocobalamin (VITAMIN B12) 500 MCG tablet Take by mouth       ergocalciferol, vitamin D2, 1,250 mcg (50,000 unit) capsule ergocalciferol (vitamin D2) 1,250 mcg (50,000 unit) capsule       ibuprofen (MOTRIN) 100 mg/5 mL suspension Take 200 mg by mouth       levocetirizine (XYZAL) 5 MG tablet levocetirizine 5 mg tablet  TAKE 1 TABLET IN THE EVENING ONCE A DAY AS NEEDED FOR ALLERGY SYMPTOMS       magnesium oxide 500 mg Cap Take by mouth once daily  methocarbamoL (ROBAXIN) 500 MG tablet methocarbamol 500 mg tablet       nadoloL (CORGARD) 40 MG tablet nadolol 40 mg tablet       omeprazole (PRILOSEC) 20 MG DR capsule Take 20 mg by mouth once daily       traMADoL (ULTRAM) 50 mg tablet tramadol 50 mg tablet        No current facility-administered medications on file prior to visit.      Family History       Family History  Problem Relation Age of Onset   Coronary Artery Disease (Blocked arteries around heart) Mother     High blood pressure (Hypertension) Father     Hyperlipidemia (Elevated cholesterol) Father     Diabetes Father     High blood pressure (Hypertension) Sister     Deep vein thrombosis (DVT or abnormal blood clot formation) Sister     High blood pressure  (Hypertension) Brother          Social History       Tobacco Use  Smoking Status Never  Smokeless Tobacco Never      Social History  Social History        Socioeconomic History   Marital status: Married  Tobacco Use   Smoking status: Never   Smokeless tobacco: Never  Vaping Use   Vaping Use: Never used  Substance and Sexual Activity   Alcohol use: Yes   Drug use: Never        Objective:      There were no vitals filed for this visit.   There is no height or weight on file to calculate BMI.   Alert and well-appearing Unlabored respirations     Assessment and Plan:  Diagnoses and all orders for this visit:   Gallbladder polyp     Slight increase in size vs measurement difference. I recommend proceeding with laparoscopic or robotic cholecystectomy with possible cholangiogram.  Discussed the relevant anatomy using a diagram to demonstrate, and went over surgical technique.  Discussed risks of surgery including bleeding, pain, scarring, intraabdominal injury specifically to the common bile duct and sequelae, bile leak, conversion to open surgery, failure to resolve symptoms, blood clots/ pulmonary embolus, heart attack, pneumonia, stroke, death. Questions welcomed and answered to patient's satisfaction.    Brianna [name] Raquel James, MD

## 2022-05-16 ENCOUNTER — Telehealth: Payer: 59 | Admitting: Nurse Practitioner

## 2022-05-16 DIAGNOSIS — J0100 Acute maxillary sinusitis, unspecified: Secondary | ICD-10-CM | POA: Diagnosis not present

## 2022-05-17 MED ORDER — FLUCONAZOLE 150 MG PO TABS: 150.0000 mg | ORAL_TABLET | Freq: Once | ORAL | 0 refills | Status: AC

## 2022-05-17 MED ORDER — DOXYCYCLINE HYCLATE 100 MG PO TABS: 100.0000 mg | ORAL_TABLET | Freq: Two times a day (BID) | ORAL | 0 refills | Status: DC

## 2022-05-17 NOTE — Progress Notes (Signed)
E-Visit for Sinus Problems  We are sorry that you are not feeling well.  Here is how we plan to help!  Based on what you have shared with me it looks like you have sinusitis.  Sinusitis is inflammation and infection in the sinus cavities of the head.  Based on your presentation I believe you most likely have Acute Bacterial Sinusitis.  This is an infection caused by bacteria and is treated with antibiotics. I have prescribed Doxycycline 100mg  by mouth twice a day for 10 days. And diflucan 150mg  1 PO when needed You may use an oral decongestant such as Mucinex D or if you have glaucoma or high blood pressure use plain Mucinex. Saline nasal spray help and can safely be used as often as needed for congestion.  If you develop worsening sinus pain, fever or notice severe headache and vision changes, or if symptoms are not better after completion of antibiotic, please schedule an appointment with a health care provider.    Sinus infections are not as easily transmitted as other respiratory infection, however we still recommend that you avoid close contact with loved ones, especially the very young and elderly.  Remember to wash your hands thoroughly throughout the day as this is the number one way to prevent the spread of infection!  Home Care: Only take medications as instructed by your medical team. Complete the entire course of an antibiotic. Do not take these medications with alcohol. A steam or ultrasonic humidifier can help congestion.  You can place a towel over your head and breathe in the steam from hot water coming from a faucet. Avoid close contacts especially the very young and the elderly. Cover your mouth when you cough or sneeze. Always remember to wash your hands.  Get Help Right Away If: You develop worsening fever or sinus pain. You develop a severe head ache or visual changes. Your symptoms persist after you have completed your treatment plan.  Make sure you Understand these  instructions. Will watch your condition. Will get help right away if you are not doing well or get worse.  Thank you for choosing an e-visit.  Your e-visit answers were reviewed by a board certified advanced clinical practitioner to complete your personal care plan. Depending upon the condition, your plan could have included both over the counter or prescription medications.  Please review your pharmacy choice. Make sure the pharmacy is open so you can pick up prescription now. If there is a problem, you may contact your provider through [employer] and have the prescription routed to another pharmacy.  Your safety is important to [place]. If you have drug allergies check your prescription carefully.   For the next 24 hours you can use MyChart to ask questions about today's visit, request a non-urgent call back, or ask for a work or school excuse. You will get an email in the next two days asking about your experience. I hope that your e-visit has been valuable and will speed your recovery.  Sadonna-Margaret Hassell Done, FNP   5-10 minutes spent reviewing and documenting in chart.

## 2022-05-21 ENCOUNTER — Encounter: Payer: Self-pay | Admitting: Cardiovascular Disease

## 2022-05-21 DIAGNOSIS — R002 Palpitations: Secondary | ICD-10-CM

## 2022-05-26 ENCOUNTER — Ambulatory Visit: Payer: 59

## 2022-05-29 ENCOUNTER — Ambulatory Visit
Admission: RE | Admit: 2022-05-29 | Discharge: 2022-05-29 | Disposition: A | Discharge status: Home / Self Care | Payer: 59 | Source: Ambulatory Visit | Attending: Family Medicine | Admitting: Family Medicine

## 2022-05-29 DIAGNOSIS — Z1231 Encounter for screening mammogram for malignant neoplasm of breast: Secondary | ICD-10-CM

## 2022-05-29 NOTE — Telephone Encounter (Signed)
Please reduce the nadolol to 20 mg daily

## 2022-06-01 ENCOUNTER — Encounter (HOSPITAL_BASED_OUTPATIENT_CLINIC_OR_DEPARTMENT_OTHER): Payer: Self-pay | Admitting: Obstetrics & Gynecology

## 2022-06-01 ENCOUNTER — Ambulatory Visit: Payer: 59 | Attending: Cardiovascular Disease

## 2022-06-01 DIAGNOSIS — R002 Palpitations: Secondary | ICD-10-CM

## 2022-06-01 NOTE — Progress Notes (Unsigned)
Enrolled for [employer] to mail a ZIO XT long term holter monitor to the patients address on file.  

## 2022-06-01 NOTE — Telephone Encounter (Signed)
Please order a 3 day zio monitor.

## 2022-06-08 DIAGNOSIS — R002 Palpitations: Secondary | ICD-10-CM

## 2022-06-21 NOTE — Patient Instructions (Addendum)
SURGICAL WAITING ROOM VISITATION Patients having surgery or a procedure may have no more than 2 support people in the waiting area - these visitors may rotate.   Children under the age of 85 must have an adult with them who is not the patient. If the patient needs to stay at the hospital during part of their recovery, the visitor guidelines for inpatient rooms apply. Pre-op nurse will coordinate an appropriate time for 1 support person to accompany patient in pre-op.  This support person may not rotate.    Please refer to the [facility] website for the visitor guidelines for Inpatients (after your surgery is over and you are in a regular room).      Your procedure is scheduled on: 07-03-22   Report to [facility] Main Entrance    Report to admitting at 12:15 PM   Call this number if you have problems the morning of surgery [phone]   Do not eat food or drink liquids :After Midnight.          If you have questions, please contact your surgeon's office.   FOLLOW ANY ADDITIONAL PRE OP INSTRUCTIONS YOU RECEIVED FROM YOUR SURGEON'S OFFICE!!!     Oral Hygiene is also important to reduce your risk of infection.                                    Remember - BRUSH YOUR TEETH THE MORNING OF SURGERY WITH YOUR REGULAR TOOTHPASTE   Do NOT smoke after Midnight   Take these medicines the morning of surgery with A SIP OF WATER:   Singulair  Nadolol  Omeprazole  Tramadol  Tylenol if needed                              You may not have any metal on your body including hair pins, jewelry, and body piercing             Do not wear make-up, lotions, powders, perfumes or deodorant  Do not wear nail polish including gel and S&S, artificial/acrylic nails, or any other type of covering on natural nails including finger and toenails. If you have artificial nails, gel coating, etc. that needs to be removed by a nail salon please have this removed prior to surgery or surgery may need to be  canceled/ delayed if the surgeon/ anesthesia feels like they are unable to be safely monitored.   Do not shave  48 hours prior to surgery.    Do not bring valuables to the hospital. [facility] IS NOT RESPONSIBLE   FOR VALUABLES.   Contacts, dentures or bridgework may not be worn into surgery.  DO NOT BRING YOUR HOME MEDICATIONS TO THE HOSPITAL. PHARMACY WILL DISPENSE MEDICATIONS LISTED ON YOUR MEDICATION LIST TO YOU DURING YOUR ADMISSION IN THE HOSPITAL!    Patients discharged on the day of surgery will not be allowed to drive home.  Someone NEEDS to stay with you for the first 24 hours after anesthesia.               Please read over the following fact sheets you were given: IF YOU HAVE QUESTIONS ABOUT YOUR PRE-OP INSTRUCTIONS PLEASE CALL [phone] [facility]  If you received a COVID test during your pre-op visit  it is requested that you wear a mask when out in public, stay away from  anyone that may not be feeling well and notify your surgeon if you develop symptoms. If you test positive for Covid or have been in contact with anyone that has tested positive in the last 10 days please notify you surgeon.  [facility] - Preparing for Surgery Before surgery, you can play an important role.  Because skin is not sterile, your skin needs to be as free of germs as possible.  You can reduce the number of germs on your skin by washing with CHG (chlorahexidine gluconate) soap before surgery.  CHG is an antiseptic cleaner which kills germs and bonds with the skin to continue killing germs even after washing. Please DO NOT use if you have an allergy to CHG or antibacterial soaps.  If your skin becomes reddened/irritated stop using the CHG and inform your nurse when you arrive at Short Stay. Do not shave (including legs and underarms) for at least 48 hours prior to the first CHG shower.  You may shave your face/neck.  Please follow these instructions carefully:  1.  Shower with CHG Soap the night before  surgery and the  morning of surgery.  2.  If you choose to wash your hair, wash your hair first as usual with your normal  shampoo.  3.  After you shampoo, rinse your hair and body thoroughly to remove the shampoo.                             4.  Use CHG as you would any other liquid soap.  You can apply chg directly to the skin and wash.  Gently with a scrungie or clean washcloth.  5.  Apply the CHG Soap to your body ONLY FROM THE NECK DOWN.   Do   not use on face/ open                           Wound or open sores. Avoid contact with eyes, ears mouth and   genitals (private parts).                       Wash face,  Genitals (private parts) with your normal soap.             6.  Wash thoroughly, paying special attention to the area where your    surgery  will be performed.  7.  Thoroughly rinse your body with warm water from the neck down.  8.  DO NOT shower/wash with your normal soap after using and rinsing off the CHG Soap.                9.  Pat yourself dry with a clean towel.            10.  Wear clean pajamas.            11.  Place clean sheets on your bed the night of your first shower and do not  sleep with pets. Day of Surgery : Do not apply any lotions/deodorants the morning of surgery.  Please wear clean clothes to the hospital/surgery center.  FAILURE TO FOLLOW THESE INSTRUCTIONS MAY RESULT IN THE CANCELLATION OF YOUR SURGERY  PATIENT SIGNATURE_________________________________  NURSE SIGNATURE__________________________________  ________________________________________________________________________

## 2022-06-21 NOTE — Progress Notes (Addendum)
COVID Vaccine Completed:  Yes  Date of COVID positive in last 90 days:  No  PCP - Garth Bigness, MD Cardiologist - Thurmon Fair, MD  Chest x-ray - N/A EKG - 10-13-21 Epic Stress Test - N/A ECHO - 2016 Epic Cardiac Cath - N/A Pacemaker/ICD device last checked: Spinal Cord Stimulator: N/A Long Term Monitor - 2023 Cardiac CT - 11-24-21 Epic  Bowel Prep - N/A  Sleep Study - N/A CPAP -   Fasting Blood Sugar - N/A Checks Blood Sugar _____ times a day  Last dose of GLP1 agonist-  N/A GLP1 instructions:  N/A   Last dose of SGLT-2 inhibitors-  N/A SGLT-2 instructions: N/A   Blood Thinner Instructions:  N/A Aspirin Instructions: Last Dose:  Activity level:  Can go up a flight of stairs and perform activities of daily living without stopping and without symptoms of chest pain or shortness of breath. Able to exercise without symptoms  Anesthesia review:  Factor V Leiden. Ectopic atrial tacycardia,  Hx of seizures (as a child).  Hx of MVP (pt states one physician said does not have and another says that she does)  Patient denies shortness of breath, fever, cough and chest pain at PAT appointment  Patient verbalized understanding of instructions that were given to them at the PAT appointment. Patient was also instructed that they will need to review over the PAT instructions again at home before surgery.

## 2022-06-26 ENCOUNTER — Encounter (HOSPITAL_BASED_OUTPATIENT_CLINIC_OR_DEPARTMENT_OTHER): Payer: Self-pay | Admitting: Obstetrics & Gynecology

## 2022-06-26 ENCOUNTER — Other Ambulatory Visit (HOSPITAL_COMMUNITY)
Admission: RE | Admit: 2022-06-26 | Discharge: 2022-06-26 | Disposition: A | Discharge status: Home / Self Care | Payer: 59 | Source: Ambulatory Visit | Attending: Obstetrics & Gynecology | Admitting: Obstetrics & Gynecology

## 2022-06-26 ENCOUNTER — Ambulatory Visit (INDEPENDENT_AMBULATORY_CARE_PROVIDER_SITE_OTHER): Payer: 59 | Admitting: Obstetrics & Gynecology

## 2022-06-26 VITALS — BP 110/57 | HR 46 | Ht 69.0 in | Wt 210.4 lb

## 2022-06-26 DIAGNOSIS — Z01419 Encounter for gynecological examination (general) (routine) without abnormal findings: Secondary | ICD-10-CM | POA: Diagnosis not present

## 2022-06-26 DIAGNOSIS — D6851 Activated protein C resistance: Secondary | ICD-10-CM

## 2022-06-26 DIAGNOSIS — Z23 Encounter for immunization: Secondary | ICD-10-CM

## 2022-06-26 DIAGNOSIS — E559 Vitamin D deficiency, unspecified: Secondary | ICD-10-CM | POA: Diagnosis not present

## 2022-06-26 DIAGNOSIS — Z124 Encounter for screening for malignant neoplasm of cervix: Secondary | ICD-10-CM

## 2022-06-26 DIAGNOSIS — B977 Papillomavirus as the cause of diseases classified elsewhere: Secondary | ICD-10-CM | POA: Diagnosis not present

## 2022-06-26 DIAGNOSIS — R748 Abnormal levels of other serum enzymes: Secondary | ICD-10-CM

## 2022-06-26 MED ORDER — VITAMIN D (ERGOCALCIFEROL) 1.25 MG (50000 UNIT) PO CAPS: 50000.0000 [IU] | ORAL_CAPSULE | ORAL | 4 refills | Status: DC

## 2022-06-26 NOTE — Progress Notes (Signed)
56 y.o. [id number] Married White or Caucasian female here for annual exam.  Having cholecystectomy scheduled next week.  This is planned as outpatient.    Denies vaginal bleeding.    Patient's last menstrual period was 12/22/2012.          Sexually active: Yes.    The current method of family planning is post menopausal status.    Smoker:  yes  Health Maintenance: Pap:  06/20/2021 Negative History of abnormal Pap:  h/o HR HPV MMG:  05/29/2022 Negative Colonoscopy:  09/24/2015, follow up 7 years BMD:   09/13/2021 Normal Screening Labs: does with Dr. Lindell Noe   reports that she has never smoked. She has been exposed to tobacco smoke. She has never used smokeless tobacco. She reports current alcohol use. She reports that she does not use drugs.  Past Medical History:  Diagnosis Date   Arthritis    Bursitis of hip    bilateral   Chronic headaches    Deviated nasal septum    Factor V Leiden [facility])    mother with h/o Factor V Leiden   Fibromyalgia    Gall bladder polyp    x3   HPV test positive 03/2012   neg Pap, +16/18 HPV   Mitral valve prolapse    Molluscum contagiosum    Myofascial pain 2013   Dr. Estanislado Pandy   Obesity    Osteopenia    Plantar fasciitis    Seizures (HCC)    Tendonitis of elbow, right     Past Surgical History:  Procedure Laterality Date   CESAREAN SECTION  1997, 2000   TEMPOROMANDIBULAR JOINT SURGERY Left 1988   WISDOM TOOTH EXTRACTION      Current Outpatient Medications  Medication Sig Dispense Refill   acetaminophen (TYLENOL) 500 MG tablet Take 500 mg by mouth as needed.     azelastine (ASTELIN) 0.1 % nasal spray Place into both nostrils.     Coenzyme Q10 (CO Q 10 PO) Take by mouth daily.     cyanocobalamin 500 MCG tablet Take 500 mcg by mouth daily.     doxycycline (VIBRA-TABS) 100 MG tablet Take 1 tablet (100 mg total) by mouth 2 (two) times daily. 1 po bid 20 tablet 0   Ibuprofen (ADVIL PO) Take 1-2 tablets by mouth as needed.      levocetirizine (XYZAL) 5 MG tablet SMARTSIG:1 Tablet(s) By Mouth Every Evening     Magnesium 500 MG CAPS Take by mouth daily.     methocarbamol (ROBAXIN) 500 MG tablet TAKE 1 TABLET BY MOUTH AT 7 AM AND 1 TABLET AT 2 PM AS NEEDED 60 tablet 3   Misc Natural Products (TART CHERRY ADVANCED PO) Take by mouth.     montelukast (SINGULAIR) 10 MG tablet Take 10 mg by mouth daily.     Multiple Vitamins-Minerals (MULTIVITAMIN WITH MINERALS) tablet Take 1 tablet by mouth daily.     nadolol (CORGARD) 40 MG tablet Take 40 mg by mouth daily.     omeprazole (PRILOSEC) 20 MG capsule Take 20 mg by mouth daily.     phentermine 15 MG capsule Take 15 mg by mouth daily.     Probiotic Product (PROBIOTIC DAILY PO) Take by mouth daily.     traMADol (ULTRAM) 50 MG tablet TAKE 1 TABLET(50 MG) BY MOUTH DAILY AS NEEDED 30 tablet 0   Vitamin D, Ergocalciferol, (DRISDOL) 1.25 MG (50000 UNIT) CAPS capsule Take 1 capsule (50,000 Units total) by mouth every 14 (fourteen) days. 6 capsule 4  No current facility-administered medications for this visit.    Family History  Problem Relation Age of Onset   COPD Mother    Rheum arthritis Mother    Hyperlipidemia Mother    Heart attack Mother        01/2016   Colon polyps Mother    Factor V Leiden deficiency Mother    Diabetes Father    Hypertension Father    Hyperlipidemia Father    Parkinson's disease Father    Colon polyps Sister 15       pre-cancerous polyps-had surgery   Heart disease Sister    Graves' disease Sister    Hashimoto's thyroiditis Sister    Factor V Leiden deficiency Sister    Breast cancer Maternal Aunt    Stomach cancer Paternal Aunt    Factor V Leiden deficiency Son    Hyperparathyroidism Neg Hx     ROS: Constitutional: negative Genitourinary:negative  Exam:   BP (!) 110/57 (BP Location: Left Arm, Patient Position: Sitting, Cuff Size: Large)   Pulse (!) 46   Ht _0  (1.753 m) Comment: Reported  Wt 210 lb 6.4 oz (95.4 kg)   LMP  12/22/2012   BMI 31.07 kg/m   Height: _1  (175.3 cm) (Reported)  General appearance: alert, cooperative and appears stated age Head: Normocephalic, without obvious abnormality, atraumatic Neck: no adenopathy, supple, symmetrical, trachea midline and thyroid normal to inspection and palpation Lungs: clear to auscultation bilaterally Breasts: normal appearance, no masses or tenderness Heart: regular rate and rhythm Abdomen: soft, non-tender; bowel sounds normal; no masses,  no organomegaly Extremities: extremities normal, atraumatic, no cyanosis or edema Skin: Skin color, texture, turgor normal. No rashes or lesions Lymph nodes: Cervical, supraclavicular, and axillary nodes normal. No abnormal inguinal nodes palpated Neurologic: Grossly normal   Pelvic: External genitalia:  no lesions              Urethra:  normal appearing urethra with no masses, tenderness or lesions              Bartholins and Skenes: normal                 Vagina: normal appearing vagina with normal color and no discharge, no lesions              Cervix: no lesions              Pap taken: Yes.   Bimanual Exam:  Uterus:  normal size, contour, position, consistency, mobility, non-tender              Adnexa: normal adnexa and no mass, fullness, tenderness               Rectovaginal: Confirms               Anus:  normal sphincter tone, no lesions  Chaperone, Octaviano Batty, CMA, was present for exam.  Assessment/Plan: 1. Well woman exam with routine gynecological exam - Pap smear only obtained today.  Pt desires yearly pap smear.  Will repeat HR HPV next year. - Mammogram 05/2022 - Colonoscopy 2017.  Due next year. - Bone mineral density done earlier this year - lab work done with PCP, Dr. Lindell Noe - vaccines reviewed/updated.  Tdap updated  2. Cervical cancer screening - Cytology - PAP( [facility])  3. Vitamin D deficiency - Vitamin D, Ergocalciferol, (DRISDOL) 1.25 MG (50000 UNIT) CAPS capsule; Take 1  capsule (50,000 Units total) by mouth every 14 (fourteen) days.  Dispense: 6  capsule; Refill: 4  4. 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 Factor V Leiden [facility]) - mother has recent DVT and has factor V Leiden  5.  Elevated alk phos - has cholecystectomy scheduled next week.  Will repeat CMP in 2 months.

## 2022-06-27 ENCOUNTER — Other Ambulatory Visit: Payer: Self-pay

## 2022-06-27 ENCOUNTER — Encounter (HOSPITAL_COMMUNITY)
Admission: RE | Admit: 2022-06-27 | Discharge: 2022-06-27 | Disposition: A | Discharge status: Home / Self Care | Payer: 59 | Source: Ambulatory Visit | Attending: Surgery | Admitting: Surgery

## 2022-06-27 ENCOUNTER — Encounter (HOSPITAL_COMMUNITY): Payer: Self-pay

## 2022-06-27 VITALS — BP 114/76 | HR 59 | Temp 98.4°F | Resp 20 | Ht 69.0 in | Wt 208.0 lb

## 2022-06-27 DIAGNOSIS — Z01818 Encounter for other preprocedural examination: Secondary | ICD-10-CM

## 2022-06-27 DIAGNOSIS — I2510 Atherosclerotic heart disease of native coronary artery without angina pectoris: Secondary | ICD-10-CM | POA: Diagnosis not present

## 2022-06-27 DIAGNOSIS — Z01812 Encounter for preprocedural laboratory examination: Secondary | ICD-10-CM | POA: Diagnosis present

## 2022-06-27 HISTORY — DX: Gastro-esophageal reflux disease without esophagitis: K21.9

## 2022-06-27 LAB — CBC
HCT: 39.9 % (ref 36.0–46.0)
Hemoglobin: 13 g/dL (ref 12.0–15.0)
MCH: 31.2 pg (ref 26.0–34.0)
MCHC: 32.6 g/dL (ref 30.0–36.0)
MCV: 95.7 fL (ref 80.0–100.0)
Platelets: 283 10*3/uL (ref 150–400)
RBC: 4.17 MIL/uL (ref 3.87–5.11)
RDW: 13.1 % (ref 11.5–15.5)
WBC: 4.9 10*3/uL (ref 4.0–10.5)
nRBC: 0 % (ref 0.0–0.2)

## 2022-06-27 LAB — BASIC METABOLIC PANEL
Anion gap: 5 (ref 5–15)
BUN: 21 mg/dL — ABNORMAL HIGH (ref 6–20)
CO2: 27 mmol/L (ref 22–32)
Calcium: 9 mg/dL (ref 8.9–10.3)
Chloride: 109 mmol/L (ref 98–111)
Creatinine, Ser: 0.67 mg/dL (ref 0.44–1.00)
GFR, Estimated: >60 mL/min (ref >60)
Glucose, Bld: 91 mg/dL (ref 70–99)
Potassium: 4 mmol/L (ref 3.5–5.1)
Sodium: 141 mmol/L (ref 135–145)

## 2022-06-27 LAB — CYTOLOGY - PAP
Adequacy: ABSENT
Diagnosis: NEGATIVE

## 2022-07-03 ENCOUNTER — Ambulatory Visit (HOSPITAL_COMMUNITY): Payer: 59 | Admitting: Physician Assistant

## 2022-07-03 ENCOUNTER — Ambulatory Visit (HOSPITAL_BASED_OUTPATIENT_CLINIC_OR_DEPARTMENT_OTHER): Payer: 59 | Admitting: Anesthesiology

## 2022-07-03 ENCOUNTER — Other Ambulatory Visit: Payer: Self-pay

## 2022-07-03 ENCOUNTER — Encounter (HOSPITAL_COMMUNITY): Payer: Self-pay | Admitting: Surgery

## 2022-07-03 ENCOUNTER — Ambulatory Visit (HOSPITAL_COMMUNITY)
Admission: RE | Admit: 2022-07-03 | Discharge: 2022-07-03 | Disposition: A | Discharge status: Home / Self Care | Payer: 59 | Attending: Surgery | Admitting: Surgery

## 2022-07-03 ENCOUNTER — Encounter (HOSPITAL_COMMUNITY)
Admission: RE | Disposition: A | Discharge status: Home / Self Care | Payer: Self-pay | Source: Home / Self Care | Attending: Surgery

## 2022-07-03 DIAGNOSIS — I341 Nonrheumatic mitral (valve) prolapse: Secondary | ICD-10-CM | POA: Diagnosis not present

## 2022-07-03 DIAGNOSIS — K811 Chronic cholecystitis: Secondary | ICD-10-CM | POA: Diagnosis present

## 2022-07-03 DIAGNOSIS — R519 Headache, unspecified: Secondary | ICD-10-CM | POA: Insufficient documentation

## 2022-07-03 DIAGNOSIS — M797 Fibromyalgia: Secondary | ICD-10-CM | POA: Insufficient documentation

## 2022-07-03 DIAGNOSIS — K824 Cholesterolosis of gallbladder: Secondary | ICD-10-CM

## 2022-07-03 DIAGNOSIS — K219 Gastro-esophageal reflux disease without esophagitis: Secondary | ICD-10-CM | POA: Diagnosis not present

## 2022-07-03 DIAGNOSIS — D6851 Activated protein C resistance: Secondary | ICD-10-CM | POA: Insufficient documentation

## 2022-07-03 HISTORY — PX: CHOLECYSTECTOMY: SHX55

## 2022-07-03 SURGERY — LAPAROSCOPIC CHOLECYSTECTOMY
Anesthesia: General | Site: Abdomen

## 2022-07-03 MED ORDER — LIDOCAINE 2% (20 MG/ML) 5 ML SYRINGE
INTRAMUSCULAR | Status: DC | PRN
  Administered 2022-07-03: 100 mg via INTRAVENOUS

## 2022-07-03 MED ORDER — LACTATED RINGERS IV SOLN: INTRAVENOUS | Status: DC

## 2022-07-03 MED ORDER — SPY AGENT GREEN - (INDOCYANINE FOR INJECTION)
INTRAMUSCULAR | Status: DC | PRN
  Administered 2022-07-03: 1 mL via INTRAVENOUS

## 2022-07-03 MED ORDER — SODIUM CHLORIDE (PF) 0.9 % IJ SOLN
INTRAMUSCULAR | Status: AC
  Filled 2022-07-03: qty 10

## 2022-07-03 MED ORDER — EPHEDRINE SULFATE-NACL 50-0.9 MG/10ML-% IV SOSY
PREFILLED_SYRINGE | INTRAVENOUS | Status: DC | PRN
  Administered 2022-07-03: 10 mg via INTRAVENOUS

## 2022-07-03 MED ORDER — ACETAMINOPHEN 500 MG PO TABS: 1000.0000 mg | ORAL_TABLET | ORAL | Status: DC

## 2022-07-03 MED ORDER — BUPIVACAINE HCL (PF) 0.25 % IJ SOLN
INTRAMUSCULAR | Status: AC
  Filled 2022-07-03: qty 30

## 2022-07-03 MED ORDER — ROCURONIUM BROMIDE 10 MG/ML (PF) SYRINGE
PREFILLED_SYRINGE | INTRAVENOUS | Status: DC | PRN
  Administered 2022-07-03: 60 mg via INTRAVENOUS

## 2022-07-03 MED ORDER — FENTANYL CITRATE (PF) 100 MCG/2ML IJ SOLN
INTRAMUSCULAR | Status: AC
  Filled 2022-07-03: qty 2

## 2022-07-03 MED ORDER — ROCURONIUM BROMIDE 10 MG/ML (PF) SYRINGE
PREFILLED_SYRINGE | INTRAVENOUS | Status: AC
  Filled 2022-07-03: qty 10

## 2022-07-03 MED ORDER — CHLORHEXIDINE GLUCONATE 4 % EX LIQD: 60.0000 mL | Freq: Once | CUTANEOUS | Status: DC

## 2022-07-03 MED ORDER — LACTATED RINGERS IV SOLN
INTRAVENOUS | Status: DC | PRN
  Administered 2022-07-03: 1000 mL

## 2022-07-03 MED ORDER — MIDAZOLAM HCL 2 MG/2ML IJ SOLN
INTRAMUSCULAR | Status: AC
  Filled 2022-07-03: qty 2

## 2022-07-03 MED ORDER — 0.9 % SODIUM CHLORIDE (POUR BTL) OPTIME
TOPICAL | Status: DC | PRN
  Administered 2022-07-03: 1000 mL

## 2022-07-03 MED ORDER — BUPIVACAINE LIPOSOME 1.3 % IJ SUSP
INTRAMUSCULAR | Status: DC | PRN
  Administered 2022-07-03: 20 mL

## 2022-07-03 MED ORDER — SODIUM CHLORIDE 0.9 % IV SOLN: 250.0000 mL | INTRAVENOUS | Status: DC | PRN

## 2022-07-03 MED ORDER — GLYCOPYRROLATE 0.2 MG/ML IJ SOLN
INTRAMUSCULAR | Status: DC | PRN
  Administered 2022-07-03: .2 mg via INTRAVENOUS

## 2022-07-03 MED ORDER — CEFAZOLIN SODIUM-DEXTROSE 2-4 GM/100ML-% IV SOLN
2.0000 g | INTRAVENOUS | Status: AC
  Administered 2022-07-03: 2 g via INTRAVENOUS
  Filled 2022-07-03: qty 100

## 2022-07-03 MED ORDER — GABAPENTIN 300 MG PO CAPS: 300.0000 mg | ORAL_CAPSULE | ORAL | Status: DC

## 2022-07-03 MED ORDER — SCOPOLAMINE 1 MG/3DAYS TD PT72
MEDICATED_PATCH | TRANSDERMAL | Status: DC | PRN
  Administered 2022-07-03: 1 via TRANSDERMAL

## 2022-07-03 MED ORDER — FENTANYL CITRATE PF 50 MCG/ML IJ SOSY: 25.0000 ug | PREFILLED_SYRINGE | INTRAMUSCULAR | Status: DC | PRN

## 2022-07-03 MED ORDER — BUPIVACAINE LIPOSOME 1.3 % IJ SUSP: 20.0000 mL | Freq: Once | INTRAMUSCULAR | Status: DC

## 2022-07-03 MED ORDER — BUPIVACAINE HCL 0.25 % IJ SOLN
INTRAMUSCULAR | Status: DC | PRN
  Administered 2022-07-03: 30 mL

## 2022-07-03 MED ORDER — PROPOFOL 10 MG/ML IV BOLUS
INTRAVENOUS | Status: DC | PRN
  Administered 2022-07-03: 160 mg via INTRAVENOUS

## 2022-07-03 MED ORDER — AMISULPRIDE (ANTIEMETIC) 5 MG/2ML IV SOLN: 10.0000 mg | Freq: Once | INTRAVENOUS | Status: DC | PRN

## 2022-07-03 MED ORDER — OXYCODONE HCL 5 MG/5ML PO SOLN: 5.0000 mg | Freq: Once | ORAL | Status: DC | PRN

## 2022-07-03 MED ORDER — SUGAMMADEX SODIUM 200 MG/2ML IV SOLN
INTRAVENOUS | Status: DC | PRN
  Administered 2022-07-03: 200 mg via INTRAVENOUS

## 2022-07-03 MED ORDER — FENTANYL CITRATE (PF) 100 MCG/2ML IJ SOLN
INTRAMUSCULAR | Status: DC | PRN
  Administered 2022-07-03 (×4): 50 ug via INTRAVENOUS

## 2022-07-03 MED ORDER — LIDOCAINE HCL (PF) 2 % IJ SOLN
INTRAMUSCULAR | Status: AC
  Filled 2022-07-03: qty 5

## 2022-07-03 MED ORDER — ORAL CARE MOUTH RINSE: 15.0000 mL | Freq: Once | OROMUCOSAL | Status: AC

## 2022-07-03 MED ORDER — PROPOFOL 10 MG/ML IV BOLUS
INTRAVENOUS | Status: AC
  Filled 2022-07-03: qty 20

## 2022-07-03 MED ORDER — ACETAMINOPHEN 650 MG RE SUPP: 650.0000 mg | RECTAL | Status: DC | PRN

## 2022-07-03 MED ORDER — ONDANSETRON HCL 4 MG/2ML IJ SOLN
INTRAMUSCULAR | Status: DC | PRN
  Administered 2022-07-03: 4 mg via INTRAVENOUS

## 2022-07-03 MED ORDER — CHLORHEXIDINE GLUCONATE 0.12 % MT SOLN
15.0000 mL | Freq: Once | OROMUCOSAL | Status: AC
  Administered 2022-07-03: 15 mL via OROMUCOSAL

## 2022-07-03 MED ORDER — ACETAMINOPHEN 325 MG PO TABS: 650.0000 mg | ORAL_TABLET | ORAL | Status: DC | PRN

## 2022-07-03 MED ORDER — ACETAMINOPHEN 500 MG PO TABS
1000.0000 mg | ORAL_TABLET | Freq: Once | ORAL | Status: AC
  Administered 2022-07-03: 1000 mg via ORAL
  Filled 2022-07-03: qty 2

## 2022-07-03 MED ORDER — SODIUM CHLORIDE 0.9% FLUSH: 3.0000 mL | Freq: Two times a day (BID) | INTRAVENOUS | Status: DC

## 2022-07-03 MED ORDER — SCOPOLAMINE 1 MG/3DAYS TD PT72
MEDICATED_PATCH | TRANSDERMAL | Status: AC
  Filled 2022-07-03: qty 1

## 2022-07-03 MED ORDER — OXYCODONE HCL 5 MG PO TABS: 5.0000 mg | ORAL_TABLET | Freq: Once | ORAL | Status: DC | PRN

## 2022-07-03 MED ORDER — OXYCODONE HCL 5 MG PO TABS: 5.0000 mg | ORAL_TABLET | ORAL | Status: DC | PRN

## 2022-07-03 MED ORDER — INDOCYANINE GREEN 25 MG IV SOLR: 7.5000 mg | Freq: Once | INTRAVENOUS | Status: DC

## 2022-07-03 MED ORDER — KETOROLAC TROMETHAMINE 30 MG/ML IJ SOLN
INTRAMUSCULAR | Status: DC | PRN
  Administered 2022-07-03: 30 mg via INTRAVENOUS

## 2022-07-03 MED ORDER — DOCUSATE SODIUM 100 MG PO CAPS: 100.0000 mg | ORAL_CAPSULE | Freq: Two times a day (BID) | ORAL | 0 refills | Status: AC

## 2022-07-03 MED ORDER — DEXAMETHASONE SODIUM PHOSPHATE 4 MG/ML IJ SOLN
INTRAMUSCULAR | Status: DC | PRN
  Administered 2022-07-03: 5 mg via INTRAVENOUS

## 2022-07-03 MED ORDER — MIDAZOLAM HCL 5 MG/5ML IJ SOLN
INTRAMUSCULAR | Status: DC | PRN
  Administered 2022-07-03: 2 mg via INTRAVENOUS

## 2022-07-03 MED ORDER — BUPIVACAINE LIPOSOME 1.3 % IJ SUSP
INTRAMUSCULAR | Status: AC
  Filled 2022-07-03: qty 20

## 2022-07-03 MED ORDER — SODIUM CHLORIDE 0.9% FLUSH: 3.0000 mL | INTRAVENOUS | Status: DC | PRN

## 2022-07-03 MED ORDER — TRAMADOL HCL 50 MG PO TABS: 50.0000 mg | ORAL_TABLET | Freq: Four times a day (QID) | ORAL | 0 refills | Status: AC | PRN

## 2022-07-03 SURGICAL SUPPLY — 78 items
ADH SKN CLS APL DERMABOND .7 (GAUZE/BANDAGES/DRESSINGS) ×4
ANTIFOG SOL W/FOAM PAD STRL (MISCELLANEOUS) ×2
APL PRP STRL LF DISP 70% ISPRP (MISCELLANEOUS) ×4
APL SKNCLS STERI-STRIP NONHPOA (GAUZE/BANDAGES/DRESSINGS)
APPLIER CLIP 5 13 M/L LIGAMAX5 (MISCELLANEOUS)
APPLIER CLIP ROT 10 11.4 M/L (STAPLE) ×2
APR CLP MED LRG 11.4X10 (STAPLE) ×2
APR CLP MED LRG 5 ANG JAW (MISCELLANEOUS)
BAG COUNTER SPONGE SURGICOUNT (BAG) ×3 IMPLANT
BAG SPNG CNTER NS LX DISP (BAG) ×2
BENZOIN TINCTURE PRP APPL 2/3 (GAUZE/BANDAGES/DRESSINGS) IMPLANT
BLADE SURG SZ11 CARB STEEL (BLADE) ×3 IMPLANT
BNDG ADH 1X3 SHEER STRL LF (GAUZE/BANDAGES/DRESSINGS) IMPLANT
BNDG ADH THN 3X1 STRL LF (GAUZE/BANDAGES/DRESSINGS)
CABLE HIGH FREQUENCY MONO STRZ (ELECTRODE) ×3 IMPLANT
CHLORAPREP W/TINT 26 (MISCELLANEOUS) ×6 IMPLANT
CLIP APPLIE 5 13 M/L LIGAMAX5 (MISCELLANEOUS) IMPLANT
CLIP APPLIE ROT 10 11.4 M/L (STAPLE) ×3 IMPLANT
CLIP LIGATING HEM O LOK PURPLE (MISCELLANEOUS) ×3 IMPLANT
COVER MAYO STAND STRL (DRAPES) IMPLANT
COVER SURGICAL LIGHT HANDLE (MISCELLANEOUS) ×3 IMPLANT
COVER TIP SHEARS 8 DVNC (MISCELLANEOUS) ×3 IMPLANT
COVER TIP SHEARS 8MM DA VINCI (MISCELLANEOUS) ×2
DERMABOND ADVANCED .7 DNX12 (GAUZE/BANDAGES/DRESSINGS) ×6 IMPLANT
DRAPE ARM DVNC X/XI (DISPOSABLE) ×12 IMPLANT
DRAPE C-ARM 42X120 X-RAY (DRAPES) IMPLANT
DRAPE COLUMN DVNC XI (DISPOSABLE) ×3 IMPLANT
DRAPE DA VINCI XI ARM (DISPOSABLE) ×8
DRAPE DA VINCI XI COLUMN (DISPOSABLE) ×2
ELECT REM PT RETURN 15FT ADLT (MISCELLANEOUS) ×6 IMPLANT
GAUZE 4X4 16PLY ~~LOC~~+RFID DBL (SPONGE) ×3 IMPLANT
GLOVE BIO SURGEON STRL SZ 6 (GLOVE) ×9 IMPLANT
GLOVE INDICATOR 6.5 STRL GRN (GLOVE) ×9 IMPLANT
GLOVE SS BIOGEL STRL SZ 6 (GLOVE) ×3 IMPLANT
GOWN STRL REUS W/ TWL LRG LVL3 (GOWN DISPOSABLE) ×9 IMPLANT
GOWN STRL REUS W/ TWL XL LVL3 (GOWN DISPOSABLE) IMPLANT
GOWN STRL REUS W/TWL LRG LVL3 (GOWN DISPOSABLE) ×6
GOWN STRL REUS W/TWL XL LVL3 (GOWN DISPOSABLE)
GRASPER SUT TROCAR 14GX15 (MISCELLANEOUS) ×3 IMPLANT
HEMOSTAT SNOW SURGICEL 2X4 (HEMOSTASIS) IMPLANT
IRRIG SUCT STRYKERFLOW 2 WTIP (MISCELLANEOUS) ×2
IRRIGATION SUCT STRKRFLW 2 WTP (MISCELLANEOUS) ×3 IMPLANT
IRRIGATOR SUCT 8 DISP DVNC XI (IRRIGATION / IRRIGATOR) IMPLANT
IRRIGATOR SUCTION 8MM XI DISP (IRRIGATION / IRRIGATOR)
KIT BASIN OR (CUSTOM PROCEDURE TRAY) ×6 IMPLANT
KIT PROCEDURE DA VINCI SI (MISCELLANEOUS)
KIT PROCEDURE DVNC SI (MISCELLANEOUS) IMPLANT
KIT TURNOVER KIT A (KITS) IMPLANT
MANIFOLD NEPTUNE II (INSTRUMENTS) ×3 IMPLANT
NDL INSUFFLATION 14GA 120MM (NEEDLE) ×4 IMPLANT
NEEDLE HYPO 22GX1.5 SAFETY (NEEDLE) ×3 IMPLANT
NEEDLE INSUFFLATION 14GA 120MM (NEEDLE) ×4 IMPLANT
PACK CARDIOVASCULAR III (CUSTOM PROCEDURE TRAY) ×3 IMPLANT
PENCIL SMOKE EVACUATOR (MISCELLANEOUS) IMPLANT
SCISSORS LAP 5X35 DISP (ENDOMECHANICALS) ×3 IMPLANT
SET CHOLANGIOGRAPH MIX (MISCELLANEOUS) IMPLANT
SET TUBE SMOKE EVAC HIGH FLOW (TUBING) ×3 IMPLANT
SLEEVE Z-THREAD 5X100MM (TROCAR) ×3 IMPLANT
SOLUTION ANTFG W/FOAM PAD STRL (MISCELLANEOUS) ×3 IMPLANT
SOLUTION ELECTROLUBE (MISCELLANEOUS) ×3 IMPLANT
SPIKE FLUID TRANSFER (MISCELLANEOUS) ×6 IMPLANT
STRIP CLOSURE SKIN 1/2X4 (GAUZE/BANDAGES/DRESSINGS) IMPLANT
SUT MNCRL AB 4-0 PS2 18 (SUTURE) ×6 IMPLANT
SUT VICRYL 0 TIES 12 18 (SUTURE) IMPLANT
SUT VICRYL 0 UR6 27IN ABS (SUTURE) IMPLANT
SYR 20ML LL LF (SYRINGE) ×3 IMPLANT
SYS BAG RETRIEVAL 10MM (BASKET) ×2
SYS RETRIEVAL 5MM INZII UNIV (BASKET) ×2
SYSTEM BAG RETRIEVAL 10MM (BASKET) ×3 IMPLANT
SYSTEM RETRIEVL 5MM INZII UNIV (BASKET) ×3 IMPLANT
TOWEL OR 17X26 10 PK STRL BLUE (TOWEL DISPOSABLE) ×6 IMPLANT
TOWEL OR NON WOVEN STRL DISP B (DISPOSABLE) ×3 IMPLANT
TRAY FOLEY MTR SLVR 16FR STAT (SET/KITS/TRAYS/PACK) IMPLANT
TRAY LAPAROSCOPIC (CUSTOM PROCEDURE TRAY) ×3 IMPLANT
TROCAR ADV FIXATION 12X100MM (TROCAR) ×3 IMPLANT
TROCAR XCEL NON-BLD 5MMX100MML (ENDOMECHANICALS) IMPLANT
TROCAR Z-THREAD OPTICAL 5X100M (TROCAR) ×3 IMPLANT
TUBING INSUFFLATION 10FT LAP (TUBING) ×3 IMPLANT

## 2022-07-03 NOTE — Anesthesia Procedure Notes (Signed)
Procedure Name: Intubation Date/Time: 07/03/2022 2:20 PM  Performed by: Vanessa [place], CRNAPre-anesthesia Checklist: Patient identified, Emergency Drugs available, Suction available and Patient being monitored Patient Re-evaluated:Patient Re-evaluated prior to induction Oxygen Delivery Method: Circle system utilized Preoxygenation: Pre-oxygenation with 100% oxygen Induction Type: IV induction Ventilation: Mask ventilation without difficulty Laryngoscope Size: 2 and Miller Grade View: Grade I Tube type: Oral Tube size: 7.0 mm Number of attempts: 1 Airway Equipment and Method: Stylet Placement Confirmation: ETT inserted through vocal cords under direct vision, positive ETCO2 and breath sounds checked- equal and bilateral Secured at: 21 cm Tube secured with: Tape Dental Injury: Teeth and Oropharynx as per pre-operative assessment

## 2022-07-03 NOTE — Op Note (Signed)
Operative Note  Brianna [name] 56 y.o. female [id number]  07/03/2022  Surgeon: Berna Bue MD FACS  Assistant: Myrtie Soman RNFA  Procedure performed: Laparoscopic Cholecystectomy  Preop diagnosis: gallbladder polyp Post-op diagnosis/intraop findings: same  Specimens: gallbladder  Retained items: none  EBL: minimal  Complications: none  Description of procedure: After obtaining informed consent the patient was brought to the operating room. Antibiotics were administered. SCD's were applied. General endotracheal anesthesia was initiated and a formal time-out was performed. The abdomen was prepped and draped in the usual sterile fashion and the abdomen was entered using an infraumbilical veress needle after instilling the site with local. Insufflation to was obtained, 100mm trocar and camera inserted, and gross inspection revealed no evidence of injury from our entry or other intraabdominal abnormalities. Two 71mm trocars were introduced in the right midclavicular and right anterior axillary lines under direct visualization and following infiltration with local. An 29mm trocar was placed in the epigastrium. The gallbladder was retracted cephalad and the infundibulum was retracted laterally. A combination of hook electrocautery and blunt dissection was utilized to clear the peritoneum from the neck and cystic duct, circumferentially isolating the cystic artery and cystic duct and lifting the gallbladder from the cystic plate. The critical view of safety was achieved with the cystic artery, cystic duct, and liver bed visualized between them with no other structures. The artery was clipped with a single clip proximally and distally and divided as was the cystic duct with three clips on the proximal end. The gallbladder was dissected from the liver plate using electrocautery. Once freed the gallbladder was placed in an endocatch bag and removed through the epigastric trocar site. Some  bile had been spilled from the gallbladder fundus during its dissection from the liver bed. This was aspirated and the right upper quadrant was irrigated copiously until the effluent was clear. Hemostasis was once again confirmed, and reinspection of the abdomen revealed no injuries. The clips were well opposed without any bile leak from the duct or the liver bed. The 29mm trocar site in the epigastrium was closed with a 0 vicryl in the fascia under direct visualization using a PMI device. The abdomen was desufflated and all trocars removed. The skin incisions were closed with subcuticular 4-0 monocryl and Dermabond. The patient was awakened, extubated and transported to the recovery room in stable condition.    All counts were correct at the completion of the case.

## 2022-07-03 NOTE — Transfer of Care (Signed)
Immediate Anesthesia Transfer of Care Note  Patient: Brianna [name]  Procedure(s) Performed: LAPAROSCOPIC CHOLECYSTECTOMY (Abdomen)  Patient Location: PACU  Anesthesia Type:General  Level of Consciousness: alert  and patient cooperative  Airway & Oxygen Therapy: Patient Spontanous Breathing and Patient connected to face mask oxygen  Post-op Assessment: Report given to RN and Post -op Vital signs reviewed and stable  Post vital signs: Reviewed and stable  Last Vitals:  Vitals Value Taken Time  BP 122/72 07/03/22 1518  Temp 36.4 C 07/03/22 1518  Pulse 51 07/03/22 1523  Resp 18 07/03/22 1523  SpO2 100 % 07/03/22 1523  Vitals shown include unvalidated device data.  Last Pain:  Vitals:   07/03/22 1518  TempSrc:   PainSc: 0-No pain         Complications: No notable events documented.

## 2022-07-03 NOTE — Anesthesia Preprocedure Evaluation (Addendum)
Anesthesia Evaluation  Patient identified by MRN, date of birth, ID band Patient awake    Reviewed: Allergy & Precautions, NPO status , Patient's Chart, lab work & pertinent test results  History of Anesthesia Complications Negative for: history of anesthetic complications  Airway Mallampati: III  TM Distance: >3 FB Neck ROM: Full    Dental no notable dental hx. (+) Teeth Intact, Dental Advisory Given   Pulmonary neg pulmonary ROS   Pulmonary exam normal breath sounds clear to auscultation       Cardiovascular (-) hypertension(-) CAD, (-) Cardiac Stents and (-) CABG + Valvular Problems/Murmurs MVP  Rhythm:Regular Rate:Normal     Neuro/Psych  Headaches, Seizures - (last seizure in 3rd grade),   negative psych ROS   GI/Hepatic Neg liver ROS,GERD  Medicated,,GALLBLADDER POLYP   Endo/Other  negative endocrine ROS    Renal/GU negative Renal ROS  negative genitourinary   Musculoskeletal  (+) Arthritis ,  Fibromyalgia -  Abdominal   Peds  Hematology  (+) Blood dyscrasia (Factor V Leiden, no h/o clots)   Anesthesia Other Findings Day of surgery medications reviewed with patient.  Reproductive/Obstetrics negative OB ROS                              Anesthesia Physical Anesthesia Plan  ASA: 2  Anesthesia Plan: General   Post-op Pain Management: Tylenol PO (pre-op)*   Induction: Intravenous  PONV Risk Score and Plan: 4 or greater and Treatment may vary due to age or medical condition, Midazolam, Dexamethasone, Ondansetron and Scopolamine patch - Pre-op  Airway Management Planned: Oral ETT  Additional Equipment: None  Intra-op Plan:   Post-operative Plan: Extubation in OR  Informed Consent: I have reviewed the patients History and Physical, chart, labs and discussed the procedure including the risks, benefits and alternatives for the proposed anesthesia with the patient or authorized  representative who has indicated his/her understanding and acceptance.     Dental advisory given  Plan Discussed with: CRNA and Anesthesiologist  Anesthesia Plan Comments: (Risks of general anesthesia discussed including, but not limited to, sore throat, hoarse voice, chipped/damaged teeth, injury to vocal cords, nausea and vomiting, allergic reactions, lung infection, heart attack, stroke, and death. All questions answered. )        Anesthesia Quick Evaluation

## 2022-07-03 NOTE — H&P (Signed)
Brianna [name] [id number]    Referring Provider:  Garth Bigness, MD     Subjective    Chief Complaint: Follow-up       History of Present Illness: Returns after follow up ultrasound done earlier this month.   Remains asymptomatic, but concerned.   Impression: 1. 2-3 gallbladder polyps are visualized, with the largest measuring 7 mm on today's exam. Morphologically this appears fairly similar to the prior exam where it was measured at 5 mm, potentially secondary to differences in measurement technique. Recommend continued follow-up with ultrasound in 1 year to ensure stability. 2. No acute process. No biliary dilatation.   Last visit May this FT:DDUK pleasant 56 year old woman with history of ectopic atrial tachycardia/palpitations, seizures, obesity, osteopenia, multiple therapeutic issues, mitral valve prolapse, fibromyalgia, factor V Leiden, chronic headaches referred for gallbladder polyp.  She has 3 polyps noted on ultrasound last month, largest is 5 mm and unchanged from previous exam December 2022.  She has been undergoing a work-up for elevated alkaline phosphatase (which was the reason for the original ultrasound ) and does have a history of vitamin D deficiency causing hyperparathyroidism in 2017.  This is not thought to have been GI in origin Per notes from [facility] (Dr. Myrtie [name]) noting normal GGT and no biliary dilation on imaging. Previous abdominal surgery includes C-section. She denies any abdominal pain, nausea, or digestive issues that would suggest biliary etiology.     Review of Systems: A complete review of systems was obtained from the patient.  I have reviewed this information and discussed as appropriate with the patient.  See HPI as well for other ROS.     Medical History: Past Medical History         Past Medical History:  Diagnosis Date   Arrhythmia     Arthritis     Seizures (CMS-HCC)          There is no problem list on file for this  patient.     Past Surgical History           Past Surgical History:  Procedure Laterality Date   TEMPOROMANDIBULAR JOINT ARTHROPLASTY   1989   CESAREAN SECTION   12/13/1995   REPEAT CESAREAN SECTION   03/14/1999        Allergies         Allergies  Allergen Reactions   Sulfa (Sulfonamide Antibiotics) Rash and Other (See Comments)                   Current Outpatient Medications on File Prior to Visit  Medication Sig Dispense Refill   acetaminophen (TYLENOL) 325 MG tablet Take by mouth every 6 (six) hours       azelastine (ASTELIN) 137 mcg nasal spray 1 spray in each nostril       C/sourcherry/celery/grape seed (TART CHERRY ORAL)         co-enzyme Q-10, ubiquinone, 50 mg capsule Take 50 mg by mouth once daily       cyanocobalamin (VITAMIN B12) 500 MCG tablet Take by mouth       ergocalciferol, vitamin D2, 1,250 mcg (50,000 unit) capsule ergocalciferol (vitamin D2) 1,250 mcg (50,000 unit) capsule       ibuprofen (MOTRIN) 100 mg/5 mL suspension Take 200 mg by mouth       levocetirizine (XYZAL) 5 MG tablet levocetirizine 5 mg tablet  TAKE 1 TABLET IN THE EVENING ONCE A DAY AS NEEDED FOR ALLERGY SYMPTOMS       magnesium  oxide 500 mg Cap Take by mouth once daily       methocarbamoL (ROBAXIN) 500 MG tablet methocarbamol 500 mg tablet       nadoloL (CORGARD) 40 MG tablet nadolol 40 mg tablet       omeprazole (PRILOSEC) 20 MG DR capsule Take 20 mg by mouth once daily       traMADoL (ULTRAM) 50 mg tablet tramadol 50 mg tablet        No current facility-administered medications on file prior to visit.      Family History           Family History  Problem Relation Age of Onset   Coronary Artery Disease (Blocked arteries around heart) Mother     High blood pressure (Hypertension) Father     Hyperlipidemia (Elevated cholesterol) Father     Diabetes Father     High blood pressure (Hypertension) Sister     Deep vein thrombosis (DVT or abnormal blood clot formation) Sister     High  blood pressure (Hypertension) Brother          Social History         Tobacco Use  Smoking Status Never  Smokeless Tobacco Never      Social History  Social History           Socioeconomic History   Marital status: Married  Tobacco Use   Smoking status: Never   Smokeless tobacco: Never  Vaping Use   Vaping Use: Never used  Substance and Sexual Activity   Alcohol use: Yes   Drug use: Never        Objective:      There were no vitals filed for this visit.   There is no height or weight on file to calculate BMI.   Alert and well-appearing Unlabored respirations     Assessment and Plan:  Diagnoses and all orders for this visit:   Gallbladder polyp     Slight increase in size vs measurement difference. I recommend proceeding with laparoscopic or robotic cholecystectomy with possible cholangiogram.  Discussed the relevant anatomy using a diagram to demonstrate, and went over surgical technique.  Discussed risks of surgery including bleeding, pain, scarring, intraabdominal injury specifically to the common bile duct and sequelae, bile leak, conversion to open surgery, failure to resolve symptoms, blood clots/ pulmonary embolus, heart attack, pneumonia, stroke, death. Questions welcomed and answered to patient's satisfaction.    Brianna Mangione Carlye Grippe, MD

## 2022-07-03 NOTE — Discharge Instructions (Signed)
LAPAROSCOPIC SURGERY: POST OP INSTRUCTIONS   EAT Gradually transition to a high fiber diet with a fiber supplement over the next few weeks after discharge.  Start with a pureed / full liquid diet (see below)  WALK Walk an hour a day (cumulative- not all at once).  Control your pain to do that.    CONTROL PAIN Control pain so that you can walk, sleep, tolerate sneezing/coughing, go up/down stairs.  HAVE A BOWEL MOVEMENT DAILY Keep your bowels regular to avoid problems.  OK to try a laxative to override constipation.  OK to use an antidiarrheal to slow down diarrhea.  Call if not better after 2 tries  CALL IF YOU HAVE PROBLEMS/CONCERNS Call if you are still struggling despite following these instructions. Call if you have concerns not answered by these instructions    DIET: Follow a light bland diet & liquids the first 24 hours after arrival home, such as soup, liquids, starches, etc.  Be sure to drink plenty of fluids.  Quickly advance to a usual solid diet within a few days.  Avoid fast food or heavy meals for the first few days as you are more likely to get nauseated or have irregular bowels.  A low-sugar, high-fiber diet for the rest of your life is ideal.  Take your usually prescribed home medications unless otherwise directed.  PAIN CONTROL: Pain is best controlled by a usual combination of three different methods TOGETHER: Ice/Heat Over the counter pain medication Prescription pain medication Most patients will experience some swelling and bruising around the incisions.  Ice packs or heating pads (30-60 minutes up to 6 times a day) will help. Use ice for the first few days to help decrease swelling and bruising, then switch to heat to help relax tight/sore spots and speed recovery.  Some people prefer to use ice alone, heat alone, alternating between ice & heat.  Experiment to what works for you.  Swelling and bruising can take several weeks to resolve.   It is helpful to take an  over-the-counter pain medication regularly for the first few days: Naproxen (Aleve, etc)  Two 220mg  tabs twice a day OR Ibuprofen (Advil, etc) Three 200mg  tabs four times a day (every meal & bedtime) AND Acetaminophen (Tylenol, etc) 500-650mg  four times a day (every meal & bedtime) A  prescription for pain medication (such as oxycodone, hydrocodone, tramadol, gabapentin, methocarbamol, etc) should be given to you upon discharge.  Take your pain medication as prescribed, IF NEEDED.  If you are having problems/concerns with the prescription medicine (does not control pain, nausea, vomiting, rash, itching, etc), please call [phone] to see if we need to switch you to a different pain medicine that will work better for you and/or control your side effect better. If you need a refill on your pain medication, please give [place] 48 hour notice.  contact your pharmacy.  They will contact our office to request authorization. Prescriptions will not be filled after 5 pm or on week-ends  Avoid getting constipated.   Between the surgery and the pain medications, it is common to experience some constipation.   Increasing fluid intake and taking a fiber supplement (such as Metamucil, Citrucel, FiberCon, MiraLax, etc) 1-2 times a day regularly will usually help prevent this problem from occurring.   A mild laxative (prune juice, Milk of Magnesia, MiraLax, etc) should be taken according to package directions if there are no bowel movements after 48 hours.   Watch out for diarrhea.  If you have many loose bowel movements, simplify your diet to bland foods & liquids for a few days.   Stop any stool softeners and decrease your fiber supplement.   Switching to mild anti-diarrheal medications (Kayopectate, Pepto Bismol) can help.   If this worsens or does not improve, please call us.  Wash / shower every day.  You may shower over the skin glue which is waterproof  Glue will flake off after about 2 weeks.  You may  leave the incision open to air.  You may replace a dressing/Band-Aid to cover the incision for comfort if you wish.   ACTIVITIES as tolerated:   You may resume regular (light) daily activities beginning the next day--such as daily self-care, walking, climbing stairs--gradually increasing activities as tolerated.  If you can walk 30 minutes without difficulty, it is safe to try more intense activity such as jogging, treadmill, bicycling, low-impact aerobics, swimming, etc. Save the most intensive and strenuous activity for last such as sit-ups, heavy lifting, contact sports, etc  Refrain from any heavy lifting or straining until you are off narcotics for pain control.   DO NOT PUSH THROUGH PAIN.  Let pain be your guide: If it hurts to do something, don't do it.  Pain is your body warning you to avoid that activity for another week until the pain goes down. You may drive when you are no longer taking prescription pain medication, you can comfortably wear a seatbelt, and you can safely maneuver your car and apply brakes. You may have sexual intercourse when it is comfortable.  FOLLOW UP in our office Please call [facility] at [phone] to set up an appointment to see your surgeon in the office for a follow-up appointment approximately 2-3 weeks after your surgery. Make sure that you call for this appointment the day you arrive home to insure a convenient appointment time.  10. IF YOU HAVE DISABILITY OR FAMILY LEAVE FORMS, BRING THEM TO THE OFFICE FOR PROCESSING.  DO NOT GIVE THEM TO YOUR DOCTOR.   WHEN TO CALL US [phone]: Poor pain control Reactions / problems with new medications (rash/itching, nausea, etc)  Fever over 101.5 F (38.5 C) Inability to urinate Nausea and/or vomiting Worsening swelling or bruising Continued bleeding from incision. Increased pain, redness, or drainage from the incision   The clinic staff is available to answer your questions during regular business hours  (8:30am-5pm).  Please don't hesitate to call and ask to speak to one of our nurses for clinical concerns.   If you have a medical emergency, go to the nearest emergency room or call 911.  A surgeon from [facility] is always on call at the hospitals   [facility] [address], [address], [place], [place]  [address] ? MAIN: [phone] ? TOLL FREE: [phone] ?  FAX [phone] [email or web address]  

## 2022-07-03 NOTE — Anesthesia Postprocedure Evaluation (Signed)
Anesthesia Post Note  Patient: Brianna [name]  Procedure(s) Performed: LAPAROSCOPIC CHOLECYSTECTOMY (Abdomen)     Patient location during evaluation: PACU Anesthesia Type: General Level of consciousness: awake and alert Pain management: pain level controlled Vital Signs Assessment: post-procedure vital signs reviewed and stable Respiratory status: spontaneous breathing, nonlabored ventilation and respiratory function stable Cardiovascular status: blood pressure returned to baseline Postop Assessment: no apparent nausea or vomiting Anesthetic complications: no   No notable events documented.  Last Vitals:  Vitals:   07/03/22 1530 07/03/22 1545  BP: 118/71 116/71  Pulse: (!) 56 (!) 49  Resp: 18 15  Temp:  (!) 36.4 C  SpO2: 100% 97%    Last Pain:  Vitals:   07/03/22 1545  TempSrc:   PainSc: 0-No pain                 Shanda Howells

## 2022-07-04 ENCOUNTER — Encounter: Payer: Self-pay | Admitting: Rheumatology

## 2022-07-04 ENCOUNTER — Encounter (HOSPITAL_COMMUNITY): Payer: Self-pay | Admitting: Surgery

## 2022-07-05 ENCOUNTER — Ambulatory Visit: Payer: 59 | Admitting: Rheumatology

## 2022-07-05 LAB — SURGICAL PATHOLOGY

## 2022-08-29 ENCOUNTER — Other Ambulatory Visit (HOSPITAL_BASED_OUTPATIENT_CLINIC_OR_DEPARTMENT_OTHER): Payer: Self-pay

## 2022-08-29 DIAGNOSIS — R748 Abnormal levels of other serum enzymes: Secondary | ICD-10-CM

## 2022-08-29 LAB — COMPREHENSIVE METABOLIC PANEL
ALT: 13 IU/L (ref 0–32)
AST: 17 IU/L (ref 0–40)
Albumin/Globulin Ratio: 2 (ref 1.2–2.2)
Albumin: 4.3 g/dL (ref 3.8–4.9)
Alkaline Phosphatase: 169 IU/L — ABNORMAL HIGH (ref 44–121)
BUN/Creatinine Ratio: 20 (ref 9–23)
BUN: 14 mg/dL (ref 6–24)
Bilirubin Total: 0.3 mg/dL (ref 0.0–1.2)
CO2: 22 mmol/L (ref 20–29)
Calcium: 9.1 mg/dL (ref 8.7–10.2)
Chloride: 106 mmol/L (ref 96–106)
Creatinine, Ser: 0.69 mg/dL (ref 0.57–1.00)
Globulin, Total: 2.1 g/dL (ref 1.5–4.5)
Glucose: 86 mg/dL (ref 70–99)
Potassium: 4.2 mmol/L (ref 3.5–5.2)
Sodium: 142 mmol/L (ref 134–144)
Total Protein: 6.4 g/dL (ref 6.0–8.5)
eGFR: 101 mL/min/{1.73_m2} (ref >59)

## 2022-09-01 ENCOUNTER — Encounter (HOSPITAL_BASED_OUTPATIENT_CLINIC_OR_DEPARTMENT_OTHER): Payer: Self-pay | Admitting: Obstetrics & Gynecology

## 2022-09-04 ENCOUNTER — Telehealth: Payer: Self-pay

## 2022-09-04 NOTE — Telephone Encounter (Signed)
Patient states that her liver enzymes are stilled evaluated are having her gallbladder removed. She would like to know what your recommendation is and if she needs to be seen.

## 2022-09-05 NOTE — Telephone Encounter (Signed)
LMTCB and mychart message sent

## 2022-09-05 NOTE — Telephone Encounter (Signed)
Patient advised and  will [facility] to schedule [facility]

## 2022-09-06 NOTE — Telephone Encounter (Signed)
Reviewed message with Dr. Estanislado Pandy.  Alk phos isoenzymes previously were WNL.  Reviewed elevated alk phos trend.   We can order a total body bone scan for further evaluation if she would like [place] to.

## 2022-09-07 ENCOUNTER — Other Ambulatory Visit: Payer: Self-pay | Admitting: *Deleted

## 2022-09-07 DIAGNOSIS — R899 Unspecified abnormal finding in specimens from other organs, systems and tissues: Secondary | ICD-10-CM

## 2022-09-07 NOTE — Telephone Encounter (Signed)
I called patient, patient verbalized understanding, TBBS ordered, pending appt

## 2022-09-11 ENCOUNTER — Encounter: Payer: Self-pay | Admitting: *Deleted

## 2022-09-27 NOTE — Progress Notes (Unsigned)
Office Visit Note  Patient: Brianna [name]             Date of Birth: 1966-05-12           MRN: [id number]             PCP: Glenis Smoker, MD Referring: Glenis Smoker, * Visit Date: 10/11/2022 Occupation: @GUAROCC @  Subjective:  Generalized pain   History of Present Illness: Brianna [name] is a 57 y.o. female with history of myofascial pain and osteoarthritis.  Patient continues to experience intermittent bouts of myalgias and muscle tenderness consistent with myofascial pain.  She has had increased discomfort over the past 2 to 3 weeks which she attributes to frequent weather changes.  Patient presents today with discomfort in both hips secondary to trochanter bursitis.  She had bilateral trochanteric bursa cortisone injections performed on 04/12/2022 which provided temporary relief.  She has been performing stretching exercises.  She is considering starting somatic yoga soon.  She denies any obvious joint swelling.  She continues to take methocarbamol very sparingly during flares and takes tramadol sparingly for pain relief.  A refill of tramadol was sent to the pharmacy on 10/09/2022 so she plans on picking up her prescription today. Patient reports that she is scheduled for a total body bone scan on 10/13/2022 for further evaluation of chronic elevated alk phos.   Activities of Daily Living:  Patient reports morning stiffness for 2 hours.   Patient Reports nocturnal pain.  Difficulty dressing/grooming: Denies Difficulty climbing stairs: Denies Difficulty getting out of chair: Denies Difficulty using hands for taps, buttons, cutlery, and/or writing: Denies  Review of Systems  Constitutional:  Positive for fatigue.  HENT: Negative.  Negative for mouth sores and mouth dryness.   Eyes:  Negative for dryness.  Respiratory: Negative.  Negative for shortness of breath.   Cardiovascular: Negative.  Negative for chest pain and palpitations.  Gastrointestinal: Negative.   Negative for blood in stool, constipation and diarrhea.  Endocrine: Negative.  Negative for increased urination.  Genitourinary: Negative.  Negative for involuntary urination.  Musculoskeletal:  Positive for joint pain, joint pain, joint swelling and morning stiffness. Negative for gait problem, myalgias, muscle weakness, muscle tenderness and myalgias.  Skin: Negative.  Negative for color change, rash, hair loss and sensitivity to sunlight.  Allergic/Immunologic: Negative.  Negative for susceptible to infections.  Neurological:  Positive for headaches. Negative for dizziness.  Hematological: Negative.  Negative for swollen glands.  Psychiatric/Behavioral:  Positive for depressed mood and sleep disturbance. The patient is nervous/anxious.     PMFS History:  Patient Active Problem List   Diagnosis Date Noted   Elevated alkaline phosphatase level 09/01/2021   Cracked lips 07/04/2020   Deviated septum 04/08/2019   Chronic nasal congestion 04/08/2019   Hypertrophy of both inferior nasal turbinates 04/08/2019   Myofascial pain syndrome 08/17/2016   HLA B27 (HLA B27 positive) 08/17/2016   Trochanteric bursitis of both hips 08/17/2016   History of migraine 08/17/2016   Primary osteoarthritis of both hands 08/17/2016   Primary osteoarthritis of both feet 08/17/2016   Plantar fasciitis 08/17/2016   Hyperparathyroidism ([place]) 09/20/2015   High risk HPV infection 05/19/2013    Past Medical History:  Diagnosis Date   Arthritis    Bursitis of hip    bilateral   Chronic headaches    Deviated nasal septum    Factor V Leiden [facility])    mother with h/o Factor V Leiden   Fibromyalgia  Gall bladder polyp    x3   GERD (gastroesophageal reflux disease)    HPV test positive 03/2012   neg Pap, +16/18 HPV   Mitral valve prolapse    Molluscum contagiosum    Myofascial pain 2013   Dr. Estanislado Pandy   Obesity    Osteopenia    Plantar fasciitis    Seizures (HCC)    Tendonitis of elbow, right      Family History  Problem Relation Age of Onset   COPD Mother    Rheum arthritis Mother    Hyperlipidemia Mother    Heart attack Mother        01/2016   Colon polyps Mother    Factor V Leiden deficiency Mother    Diabetes Father    Hypertension Father    Hyperlipidemia Father    Parkinson's disease Father    Colon polyps Sister 34       pre-cancerous polyps-had surgery   Heart disease Sister    Graves' disease Sister    Hashimoto's thyroiditis Sister    Factor V Leiden deficiency Sister    Breast cancer Maternal Aunt    Stomach cancer Paternal Aunt    Factor V Leiden deficiency Son    Hyperparathyroidism Neg Hx    Past Surgical History:  Procedure Laterality Date   CESAREAN SECTION  1997, 2000   CHOLECYSTECTOMY N/A 07/03/2022   Procedure: LAPAROSCOPIC CHOLECYSTECTOMY;  Surgeon: Clovis Riley, MD;  Location: [facility];  Service: General;  Laterality: N/A;   TEMPOROMANDIBULAR JOINT SURGERY Left 1988   WISDOM TOOTH EXTRACTION     Social History   Social History Narrative   Patient is right-handed. She lives with her husband in a 2 level home. She drinks tea 1-2 x a day. And an occasional soda. She walks most days for 30 minutes.   Immunization History  Administered Date(s) Administered   Influenza,inj,Quad PF,6+ Mos 04/22/2019, 06/26/2022   Influenza-Unspecified 05/18/2020, 05/20/2021   Moderna Sars-Covid-2 Vaccination 07/29/2019, 08/26/2019, 06/29/2020   Pfizer Covid-19 Vaccine Bivalent Booster 22yrs & up 04/07/2021   Tdap 07/30/2015   Zoster Recombinat (Shingrix) 08/24/2018, 06/20/2021     Objective: Vital Signs: BP 119/76 (BP Location: Left Arm, Patient Position: Sitting, Cuff Size: Large)   Pulse 63   Resp 16   Ht 5\' 9"  (1.753 m)   Wt 204 lb (92.5 kg)   LMP 12/22/2012   BMI 30.13 kg/m    Physical Exam Vitals and nursing note reviewed.  Constitutional:      Appearance: She is well-developed.  HENT:     Head: Normocephalic and atraumatic.  Eyes:      Conjunctiva/sclera: Conjunctivae normal.  Cardiovascular:     Rate and Rhythm: Normal rate and regular rhythm.     Heart sounds: Normal heart sounds.  Pulmonary:     Effort: Pulmonary effort is normal.     Breath sounds: Normal breath sounds.  Abdominal:     General: Bowel sounds are normal.     Palpations: Abdomen is soft.  Musculoskeletal:     Cervical back: Normal range of motion.  Lymphadenopathy:     Cervical: No cervical adenopathy.  Skin:    General: Skin is warm and dry.     Capillary Refill: Capillary refill takes less than 2 seconds.  Neurological:     Mental Status: She is alert and oriented to person, place, and time.  Psychiatric:        Behavior: Behavior normal.      Musculoskeletal Exam:  C-spine, thoracic spine, lumbar spine have good range of motion with no discomfort.  Shoulder joints, elbow joints, wrist joints, MCPs, PIPs, DIPs have good range of motion with no synovitis.  Complete fist formation bilaterally.  Hip joints have good range of motion with no groin pain.  Knee joints have good range of motion with no warmth or effusion.  Ankle joints have good range of motion with no tenderness or joint swelling.  Some tenderness over bilateral trochanteric bursa.  CDAI Exam: CDAI Score: -- Patient Global: --; Provider Global: -- Swollen: --; Tender: -- Joint Exam 10/11/2022   No joint exam has been documented for this visit   There is currently no information documented on the homunculus. Go to the Rheumatology activity and complete the homunculus joint exam.  Investigation: No additional findings.  Imaging: No results found.  Recent Labs: Lab Results  Component Value Date   WBC 4.9 06/27/2022   HGB 13.0 06/27/2022   PLT 283 06/27/2022   NA 142 08/29/2022   K 4.2 08/29/2022   CL 106 08/29/2022   CO2 22 08/29/2022   GLUCOSE 86 08/29/2022   BUN 14 08/29/2022   CREATININE 0.69 08/29/2022   BILITOT 0.3 08/29/2022   ALKPHOS 169 (H) 08/29/2022   AST  17 08/29/2022   ALT 13 08/29/2022   PROT 6.4 08/29/2022   ALBUMIN 4.3 08/29/2022   CALCIUM 9.1 08/29/2022   GFRAA 110 05/16/2019    Speciality Comments: No specialty comments available.  Procedures:  No procedures performed Allergies: Z-pak [azithromycin], Amoxicillin, Augmentin [amoxicillin-pot clavulanate], and Sulfa antibiotics    Robaxin and tramadol  Assessment / Plan:     Visit Diagnoses: Myofascial pain syndrome -Patient presents today experiencing symptoms consistent with a myofascial pain flare.  She has been having increased myalgias and muscle tenderness over the past 2 to 3 weeks which she attributes to recent weather changes as well as being under increased stress.  She is having trapezius muscle tension and tenderness bilaterally.  She has ongoing discomfort due to trochanter bursitis of both hips.  She had bilateral trochanteric bursa cortisone injection on 04/12/2022 which were only provided temporary relief.  She has continued home exercises.  She is considering proceeding with somatic yoga in the future.  Discussed the importance of stress management, regular exercise, and good sleep hygiene.  A refill of tramadol was sent to the pharmacy on 10/09/2022.  UDS and narcotic agreement will be updated today.  She plans on continuing to take tramadol and methocarbamol as needed during flares.  She was also given a list of natural anti-inflammatories which she can start taking.  She plans on avoiding turmeric as recommended by her gastroenterologist.  She will follow-up in the office in 6 months or sooner if needed.  Plan: DRUG MONITOR, TRAMADOL,QN, URINE, DRUG MONITOR, PANEL 5, W/CONF, URINE  Trapezius muscle spasm: She has trapezius muscle tension and tenderness bilaterally.  She has a prescription for methocarbamol which she can take as needed for muscle spasms.  She will notify us if she would like to return for trigger point injections in the future.  Primary osteoarthritis of  both hands: No tenderness or synovitis noted.  Complete fist formation bilaterally.  Discussed the importance of joint protection and muscle strengthening.  Trochanteric bursitis of both hips - Injected both hips on 04/12/22-temporary relief.  She has continued home exercises.  She is considering proceeding with somatic ago.  She declined repeat cortisone injections today.  Plantar fasciitis: She continues to experience  intermittent discomfort in the left foot.  Primary osteoarthritis of both feet: She has good range of motion of both ankle joints with no tenderness or synovitis.  She experiences intermittent discomfort in her left foot and has intermittent symptoms of plantar fasciitis.  She no tenderness or synovitis over MTP joints.  She is wearing proper fitting shoes.  Medication monitoring encounter -UDS and narcotic agreement were updated today.  Plan: DRUG MONITOR, TRAMADOL,QN, URINE, DRUG MONITOR, PANEL 5, W/CONF, URINE  Elevated alkaline phosphatase level: Chronically elevated alk phos.  Patient has undergone a thorough workup by gastroenterology.  She is scheduled for total body bone scan on 10/13/2022 for further evaluation.  Other medical conditions are listed as follows:  HLA B27 (HLA B27 positive)  Hyperparathyroidism (HCC)  Vitamin D deficiency  History of migraine    Orders: Orders Placed This Encounter  Procedures   DRUG MONITOR, TRAMADOL,QN, URINE   DRUG MONITOR, PANEL 5, W/CONF, URINE   No orders of the defined types were placed in this encounter.   Follow-Up Instructions: Return in about 6 months (around 04/13/2023) for Osteoarthritis, Myofascial pain.   Ofilia Neas, PA-C  Note - This record has been created using Dragon software.  Chart creation errors have been sought, but may not always  have been located. Such creation errors do not reflect on  the standard of medical care.

## 2022-10-03 ENCOUNTER — Encounter (HOSPITAL_COMMUNITY): Payer: 59

## 2022-10-08 ENCOUNTER — Other Ambulatory Visit: Payer: Self-pay | Admitting: Physician Assistant

## 2022-10-09 NOTE — Telephone Encounter (Signed)
Patient did advise [place] she was given a prescription. She had surgery to have her gallbladder removed. Patient is coming in Wednesday and will note appointment to update UDS and narcotic agreement.

## 2022-10-09 NOTE — Telephone Encounter (Signed)
Please check to see if the patient notified us of Bobbe Medico sending in tramadol on 07/03/22? Please clarify what the tramadol prescription was prescribed for?  Due to update UDS and narcotic agreement.

## 2022-10-09 NOTE — Telephone Encounter (Signed)
Next Visit: 10/11/2022   Last Visit: 04/12/2022   UDS:01/03/2022   Narc Agreement: 01/03/2022   Last Fill: 05/01/2022  tramadol 50 mg at bedtime as needed   Okay to refill Tramadol?

## 2022-10-11 ENCOUNTER — Ambulatory Visit: Payer: 59 | Attending: Rheumatology | Admitting: Physician Assistant

## 2022-10-11 ENCOUNTER — Encounter: Payer: Self-pay | Admitting: Physician Assistant

## 2022-10-11 VITALS — BP 119/76 | HR 63 | Resp 16 | Ht 69.0 in | Wt 204.0 lb

## 2022-10-11 DIAGNOSIS — M19042 Primary osteoarthritis, left hand: Secondary | ICD-10-CM

## 2022-10-11 DIAGNOSIS — M7918 Myalgia, other site: Secondary | ICD-10-CM

## 2022-10-11 DIAGNOSIS — M19071 Primary osteoarthritis, right ankle and foot: Secondary | ICD-10-CM

## 2022-10-11 DIAGNOSIS — M7061 Trochanteric bursitis, right hip: Secondary | ICD-10-CM | POA: Diagnosis not present

## 2022-10-11 DIAGNOSIS — M722 Plantar fascial fibromatosis: Secondary | ICD-10-CM | POA: Diagnosis not present

## 2022-10-11 DIAGNOSIS — R748 Abnormal levels of other serum enzymes: Secondary | ICD-10-CM

## 2022-10-11 DIAGNOSIS — M62838 Other muscle spasm: Secondary | ICD-10-CM | POA: Diagnosis not present

## 2022-10-11 DIAGNOSIS — E213 Hyperparathyroidism, unspecified: Secondary | ICD-10-CM

## 2022-10-11 DIAGNOSIS — E559 Vitamin D deficiency, unspecified: Secondary | ICD-10-CM

## 2022-10-11 DIAGNOSIS — Z5181 Encounter for therapeutic drug level monitoring: Secondary | ICD-10-CM

## 2022-10-11 DIAGNOSIS — M19072 Primary osteoarthritis, left ankle and foot: Secondary | ICD-10-CM

## 2022-10-11 DIAGNOSIS — Z8669 Personal history of other diseases of the nervous system and sense organs: Secondary | ICD-10-CM

## 2022-10-11 DIAGNOSIS — M7062 Trochanteric bursitis, left hip: Secondary | ICD-10-CM

## 2022-10-11 DIAGNOSIS — M19041 Primary osteoarthritis, right hand: Secondary | ICD-10-CM | POA: Diagnosis not present

## 2022-10-11 DIAGNOSIS — Z1589 Genetic susceptibility to other disease: Secondary | ICD-10-CM

## 2022-10-12 LAB — DM TEMPLATE

## 2022-10-13 ENCOUNTER — Encounter (HOSPITAL_COMMUNITY)
Admission: RE | Admit: 2022-10-13 | Discharge: 2022-10-13 | Disposition: A | Discharge status: Home / Self Care | Payer: 59 | Source: Ambulatory Visit | Attending: Rheumatology | Admitting: Rheumatology

## 2022-10-13 DIAGNOSIS — R899 Unspecified abnormal finding in specimens from other organs, systems and tissues: Secondary | ICD-10-CM | POA: Diagnosis present

## 2022-10-13 LAB — DRUG MONITOR, PANEL 5, W/CONF, URINE
Amphetamines: NEGATIVE ng/mL (ref <500)
Barbiturates: NEGATIVE ng/mL (ref <300)
Benzodiazepines: NEGATIVE ng/mL (ref <100)
Cocaine Metabolite: NEGATIVE ng/mL (ref <150)
Creatinine: 96.7 mg/dL (ref >=20.0)
Marijuana Metabolite: NEGATIVE ng/mL (ref <20)
Methadone Metabolite: NEGATIVE ng/mL (ref <100)
Opiates: NEGATIVE ng/mL (ref <100)
Oxidant: NEGATIVE ug/mL (ref <200)
Oxycodone: NEGATIVE ng/mL (ref <100)
pH: 5.4 (ref 4.5–9.0)

## 2022-10-13 LAB — DRUG MONITOR, TRAMADOL,QN, URINE
Desmethyltramadol: NEGATIVE ng/mL
Tramadol: NEGATIVE ng/mL

## 2022-10-13 LAB — DM TEMPLATE

## 2022-10-13 MED ORDER — TECHNETIUM TC 99M MEDRONATE IV KIT
20.0000 | PACK | Freq: Once | INTRAVENOUS | Status: AC | PRN
  Administered 2022-10-13: 19.2 via INTRAVENOUS

## 2022-10-16 NOTE — Progress Notes (Signed)
Patient takes tramadol sparingly.  UDS negative for any other substances.

## 2022-10-18 ENCOUNTER — Other Ambulatory Visit: Payer: Self-pay | Admitting: *Deleted

## 2022-10-18 DIAGNOSIS — R748 Abnormal levels of other serum enzymes: Secondary | ICD-10-CM

## 2022-10-18 NOTE — Progress Notes (Signed)
Please discussed the results with the patient and order thoracic spine x-rays at the hospital.

## 2022-10-25 ENCOUNTER — Encounter: Payer: Self-pay | Admitting: Rheumatology

## 2022-10-26 ENCOUNTER — Ambulatory Visit (HOSPITAL_COMMUNITY)
Admission: RE | Admit: 2022-10-26 | Discharge: 2022-10-26 | Disposition: A | Discharge status: Home / Self Care | Payer: 59 | Source: Ambulatory Visit | Attending: Rheumatology | Admitting: Rheumatology

## 2022-10-26 ENCOUNTER — Other Ambulatory Visit: Payer: Self-pay | Admitting: Rheumatology

## 2022-10-26 DIAGNOSIS — R748 Abnormal levels of other serum enzymes: Secondary | ICD-10-CM

## 2022-10-30 NOTE — Progress Notes (Signed)
I reviewed the radiology report.  X-rays showed some arthritic changes in the spine and no other abnormalities.

## 2022-10-31 ENCOUNTER — Telehealth: Payer: Self-pay | Admitting: *Deleted

## 2022-10-31 DIAGNOSIS — M7918 Myalgia, other site: Secondary | ICD-10-CM

## 2022-10-31 NOTE — Progress Notes (Signed)
We can refer her to physical therapy.

## 2022-10-31 NOTE — Telephone Encounter (Signed)
-----   Message from Pollyann Savoy, MD sent at 10/31/2022  8:04 AM EDT ----- We can refer her to physical therapy.

## 2022-11-21 ENCOUNTER — Ambulatory Visit: Payer: 59 | Attending: Rheumatology | Admitting: Physical Therapy

## 2022-11-21 ENCOUNTER — Other Ambulatory Visit: Payer: Self-pay

## 2022-11-21 ENCOUNTER — Encounter: Payer: Self-pay | Admitting: Physical Therapy

## 2022-11-21 DIAGNOSIS — M6281 Muscle weakness (generalized): Secondary | ICD-10-CM | POA: Insufficient documentation

## 2022-11-21 DIAGNOSIS — M25551 Pain in right hip: Secondary | ICD-10-CM | POA: Diagnosis present

## 2022-11-21 DIAGNOSIS — M7918 Myalgia, other site: Secondary | ICD-10-CM | POA: Insufficient documentation

## 2022-11-21 DIAGNOSIS — M542 Cervicalgia: Secondary | ICD-10-CM | POA: Diagnosis not present

## 2022-11-21 DIAGNOSIS — M25552 Pain in left hip: Secondary | ICD-10-CM | POA: Diagnosis present

## 2022-11-21 NOTE — Therapy (Signed)
OUTPATIENT PHYSICAL THERAPY EVALUATION   Patient Name: Brianna [name] MRN: [id number] DOB:[birth year], 57 y.o., female Today's Date: 11/21/2022  END OF SESSION:  PT End of Session - 11/21/22 1415     Visit Number 1    Number of Visits 17    Date for PT Re-Evaluation 01/16/23    Authorization Type [employer]    PT Start Time 1416    PT Stop Time 1510    PT Time Calculation (min) 54 min    Activity Tolerance Patient tolerated treatment well;No increased pain    Behavior During Therapy WFL for tasks assessed/performed             Past Medical History:  Diagnosis Date   Arthritis    Bursitis of hip    bilateral   Chronic headaches    Deviated nasal septum    Factor V Leiden [facility])    mother with h/o Factor V Leiden   Fibromyalgia    Gall bladder polyp    x3   GERD (gastroesophageal reflux disease)    HPV test positive 03/2012   neg Pap, +16/18 HPV   Mitral valve prolapse    Molluscum contagiosum    Myofascial pain 2013   Dr. Corliss Skains   Obesity    Osteopenia    Plantar fasciitis    Seizures (HCC)    Tendonitis of elbow, right    Past Surgical History:  Procedure Laterality Date   CESAREAN SECTION  1997, 2000   CHOLECYSTECTOMY N/A 07/03/2022   Procedure: LAPAROSCOPIC CHOLECYSTECTOMY;  Surgeon: Berna Bue, MD;  Location: [facility];  Service: General;  Laterality: N/A;   TEMPOROMANDIBULAR JOINT SURGERY Left 1988   WISDOM TOOTH EXTRACTION     Patient Active Problem List   Diagnosis Date Noted   Elevated alkaline phosphatase level 09/01/2021   Cracked lips 07/04/2020   Deviated septum 04/08/2019   Chronic nasal congestion 04/08/2019   Hypertrophy of both inferior nasal turbinates 04/08/2019   Myofascial pain syndrome 08/17/2016   HLA B27 (HLA B27 positive) 08/17/2016   Trochanteric bursitis of both hips 08/17/2016   History of migraine 08/17/2016   Primary osteoarthritis of both hands 08/17/2016   Primary osteoarthritis of both feet 08/17/2016   Plantar  fasciitis 08/17/2016   Hyperparathyroidism (HCC) 09/20/2015   High risk HPV infection 05/19/2013    PCP: Shon Hale, MD  REFERRING PROVIDER: Pollyann Savoy, MD  REFERRING DIAG: M79.18 (ICD-10-CM) - Myofascial pain syndrome  Rationale for Evaluation and Treatment: Rehabilitation  THERAPY DIAG:  Cervicalgia  Pain of both hip joints  Muscle weakness (generalized)  ONSET DATE: several years  SUBJECTIVE:  SUBJECTIVE STATEMENT: Pt endorses history of multi joint chronic pain, daily headaches, with most of her symptoms concentrated along neck/shoulders, low back, and especially BIL hips. States she tends to be able to push through pain during her daily activities, but symptoms catch up near the end of the day and at night. States historically she was fairly active prior to having children, would like to get back to doing more activities. She notes increased life stressors over the past few months which she states seems to have increased her recent pain levels, and historically she seems to have noticed more pain when she is stressed. Unable to describe specific activity triggers, exacerbations seem variable.   PERTINENT HISTORY:  hyperparathyroidism, multiple MSK issues, hx HPV, migraines, osteopenia   PAIN:  Are you having pain: 4/10 Location/description: today mostly in shoulders, mid back, and wrists  Best-worst over past week: 4-8/10  - aggravating factors: prolonged sitting, increased activity at end of day  - Easing factors: rest, changing positions, ice, heat   PRECAUTIONS: None  WEIGHT BEARING RESTRICTIONS: No  FALLS:  Has patient fallen in last 6 months? 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 Abnormality on bone scan is likely due to facet arthropathy.  PATIENT SURVEYS:  FOTO 48 current, 52 predicted  SCREENING FOR RED FLAGS: Does not endorse any red flags  COGNITION: Overall cognitive status: Within functional limits for tasks assessed     SENSATION: No neuro complaints   POSTURE: mild forward head  PALPATION: deferred  LUMBAR ROM:   AROM eval  Flexion   Extension   Right lateral flexion   Left lateral flexion   Right rotation   Left rotation    (Blank rows = not tested)  RANGE OF MOTION:     Active  Right eval Left eval  Shoulder flexion/abduction WFL symmetrical WFL symmetrical   Functional ER combo Scapular spine, s  Scapular spine, s   Functional IR combo T3  T3  Knee extension    Ankle dorsiflexion     (Blank rows = not tested) (Key: WFL = within functional limits not formally assessed, * = concordant pain, s = stiffness/stretching sensation, NT = not tested)  Comments:    STRENGTH TESTING:  MMT Right eval Left eval  Shoulder flexion 5 5  Shoulder abduction 5 5  Shoulder extension 5 5  Elbow extension    Grip strength (gross)    Hip flexion 3+ 4  Hip abduction (modified sitting) 5 5  Knee flexion 5 5  Knee extension 5 5  Ankle dorsiflexion    Ankle plantarflexion     (Blank rows = not tested) (Key: WFL = within functional limits not formally assessed, * = concordant pain, s = stiffness/stretching sensation, NT = not tested)  Comments: MMT painless per pt   FUNCTIONAL TESTS:  5xSTS: 10.87 sec gentle UE support no increase in pain TUG: 7 sec no UE  support  GAIT: Distance walked: within clinic Assistive device utilized: None Level of assistance: Complete Independence Comments: step through pattern BIL, mildly  reduced truncal rotation and arm swing   TODAY'S TREATMENT:                                                                                                                              [facility] Adult PT Treatment:                                                DATE: 11/21/22 Therapeutic Exercise: Scapular retraction x10 cues for reduced UT compensations Standing hip extensions x10 BIL cues for reduced knee compensations STS x10 w/ gentle UE support from chair HEP handout + education Education on relevant anatomy/physiology as it pertains to exercise in session, HEP, and general exercise outside of sessions    PATIENT EDUCATION:  Education details: Pt education on PT impairments, prognosis, and POC. Informed consent. Rationale for interventions, safe/appropriate HEP performance. relevant anatomy/physiology  Person educated: Patient Education method: Explanation, Demonstration, Tactile cues, Verbal cues, and Handouts Education comprehension: verbalized understanding, returned demonstration, verbal cues required, tactile cues required, and needs further education    HOME EXERCISE PROGRAM: Access Code: [email or web address] URL: [email or web address] Date: 11/21/2022 Prepared by: Fransisco Hertz  Exercises - Seated Scapular Retraction  - 1 x daily - 7 x weekly - 2 sets - 15 reps - Standing Hip Extension with Counter Support  - 1 x daily - 7 x weekly - 2 sets - 15 reps - Sit to Stand with Armchair  - 1 x daily - 7 x weekly - 2 sets - 10 reps  ASSESSMENT:  CLINICAL IMPRESSION: Patient is a pleasant 57 y.o. woman who was seen today for physical therapy evaluation and treatment for myofascial pain syndrome per referral. Endorses longstanding history of multi-site pain that seems to typically concentrate in neck/shoulders, low back,  and hips. Triggers are variable but tend to be worse at the end of active days. States her goal is to be able to manage symptoms better to promote more sustainable activity levels and functional tolerance. On exam pt demos good UE strength and functional GH ROM, mild BIL hip weakness. Requires gentle UE support for 5xSTS although score is not indicative of fall risk. As pt states her pain tends to be more noticeable with prolonged activity, anticipate would benefit from generalized multi joint strengthening with emphasis on postural endurance. Given medical diagnosis of fibromyalgia and referring diagnosis of myofascial pain syndrome, also anticipate pt would benefit from graded walking program and education on sustainable activity habits, possible PNE. No adverse events, pt denies any increase in pain on departure. Recommend skilled PT to address aforementioned deficits with goal of improving functional tolerance, reducing pain levels, and promoting long term self efficacy with management of symptoms. Pt departs today's session in no acute distress, all voiced questions/concerns addressed appropriately from PT perspective.       OBJECTIVE IMPAIRMENTS:  Abnormal gait, decreased activity tolerance, decreased endurance, decreased mobility, difficulty walking, decreased strength, impaired perceived functional ability, postural dysfunction, and pain.   ACTIVITY LIMITATIONS: carrying, lifting, bending, sitting, standing, squatting, stairs, transfers, and locomotion level  PARTICIPATION LIMITATIONS: meal prep, cleaning, laundry, community activity, and occupation  PERSONAL FACTORS: Time since onset of injury/illness/exacerbation and 3+ comorbidities: multiple MSK issues, migraines, hx HPV, osteopenia  are also affecting patient's functional outcome.   REHAB POTENTIAL: Fair given chronicity and comorbidities  CLINICAL DECISION MAKING: Stable/uncomplicated  EVALUATION COMPLEXITY: Low   GOALS: Goals reviewed  with patient? Yes  SHORT TERM GOALS: Target date: 12/19/2022 Pt will demonstrate appropriate understanding and performance of initially prescribed HEP in order to facilitate improved independence with management of symptoms.  Baseline: HEP provided on eval Goal status: INITIAL    LONG TERM GOALS: Target date: 01/16/2023 Pt will score 52 on FOTO in order to demonstrate improved perception of functional status due to symptoms.  Baseline: 48 Goal status: INITIAL  2.   Pt will demonstrate hip flexion MMT of 4+/5 bilaterally in order to demonstrate improved strength for functional movements.  Baseline: see MMT chart above Goal status: INITIAL  3.  Pt will perform 5xSTS in <8 sec in order to demonstrate reduced fall risk and improved functional independence. (MCID of 2.3sec)  Baseline: 10 sec gentle UE support from thighs  Goal status: INITIAL   4. Pt will demonstrate appropriate performance of final prescribed HEP in order to facilitate improved self-management of symptoms post-discharge.   Baseline: initial HEP prescribed  Goal status: INITIAL    5. Pt will report at least 50% decrease in overall pain levels in past week in order to facilitate improved tolerance to basic ADLs/mobility.   Baseline: 4-8/10 multi site over past week  Goal status: INITIAL    6. Pt will endorse ability to perform typical housework activities with less than 4/10 pain on NPS in order to promote improved tolerance to daily activities.   Baseline: up to 8/10 with daily activities  Goal status: INITIAL   PLAN:  PT FREQUENCY: 2x/week  PT DURATION: 8 weeks  PLANNED INTERVENTIONS: Therapeutic exercises, Therapeutic activity, Neuromuscular re-education, Balance training, Gait training, Patient/Family education, Self Care, Joint mobilization, Stair training, DME instructions, Aquatic Therapy, Dry Needling, Spinal mobilization, Cryotherapy, Moist heat, Taping, Manual therapy, and Re-evaluation.  PLAN FOR NEXT  SESSION: Progress strengthening exercises as able/appropriate, review HEP. Emphasis on postural endurance, functional mobility, graded progression of exercise in context of myofascial pain syndrome and fibromyalgia   Ashley Murrain PT, DPT 11/21/2022 3:57 PM

## 2022-11-26 ENCOUNTER — Other Ambulatory Visit: Payer: Self-pay | Admitting: Physician Assistant

## 2022-11-27 IMAGING — MG MM DIGITAL SCREENING BILAT W/ TOMO AND CAD
8 series · 8 of 24 positions shown · non-contrast
Comparison: Previous exam(s).

CLINICAL DATA: Screening.

EXAM:
DIGITAL SCREENING BILATERAL MAMMOGRAM WITH TOMOSYNTHESIS AND CAD
TECHNIQUE: Bilateral screening digital craniocaudal and mediolateral oblique
mammograms were obtained. Bilateral screening digital breast
tomosynthesis was performed. The images were evaluated with
computer-aided detection.

[R MLO synth-2D]
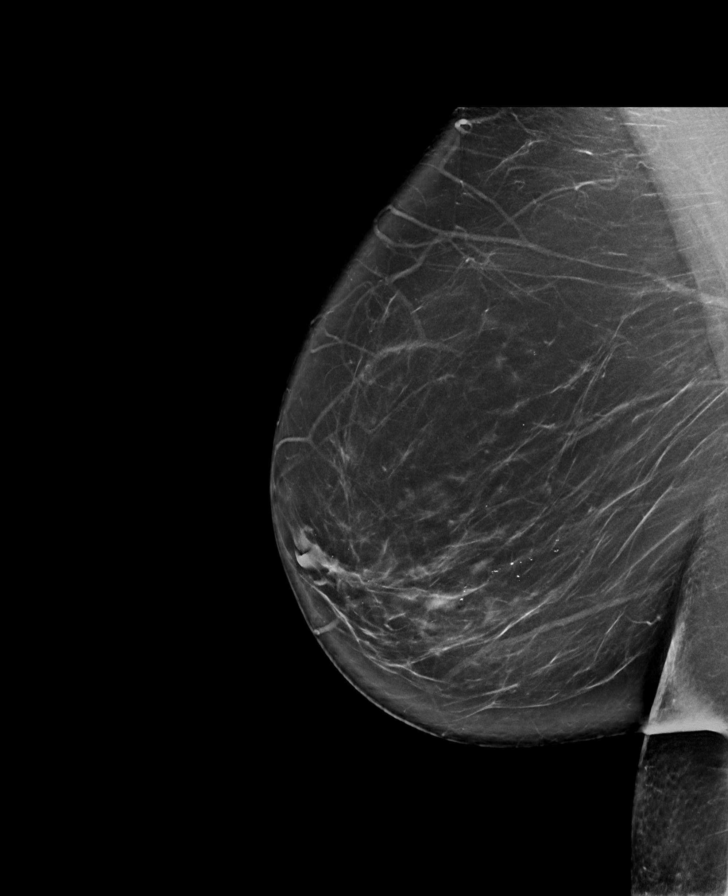

[R CC synth-2D]
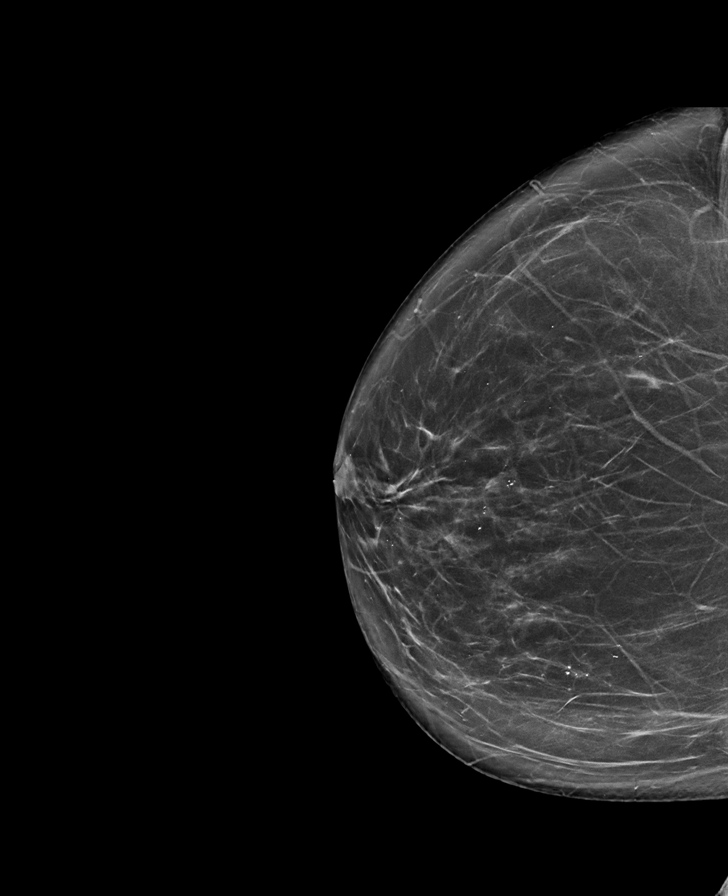

[L MLO synth-2D]
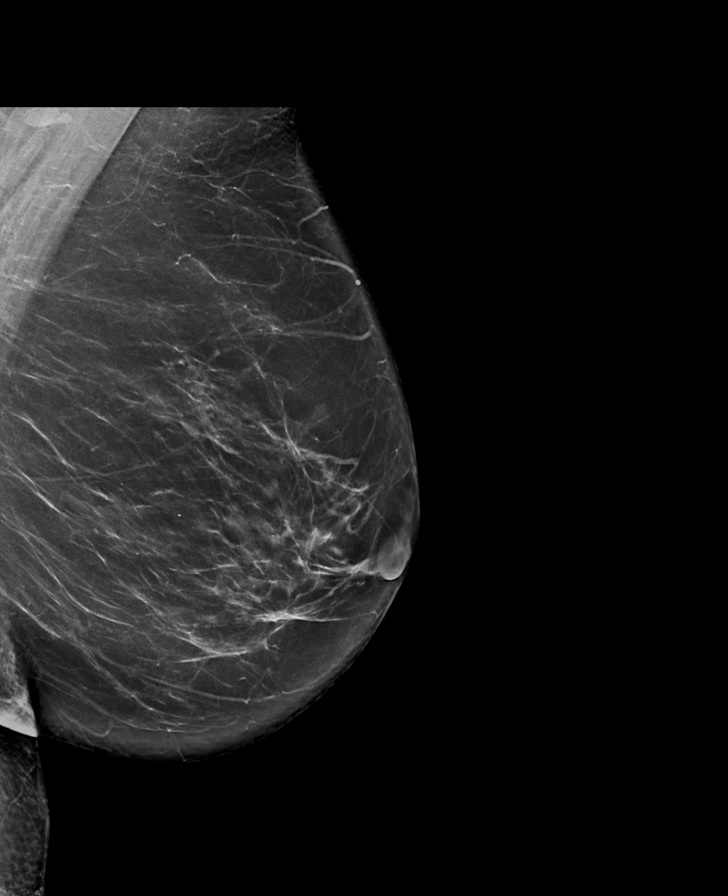

[L CC synth-2D]
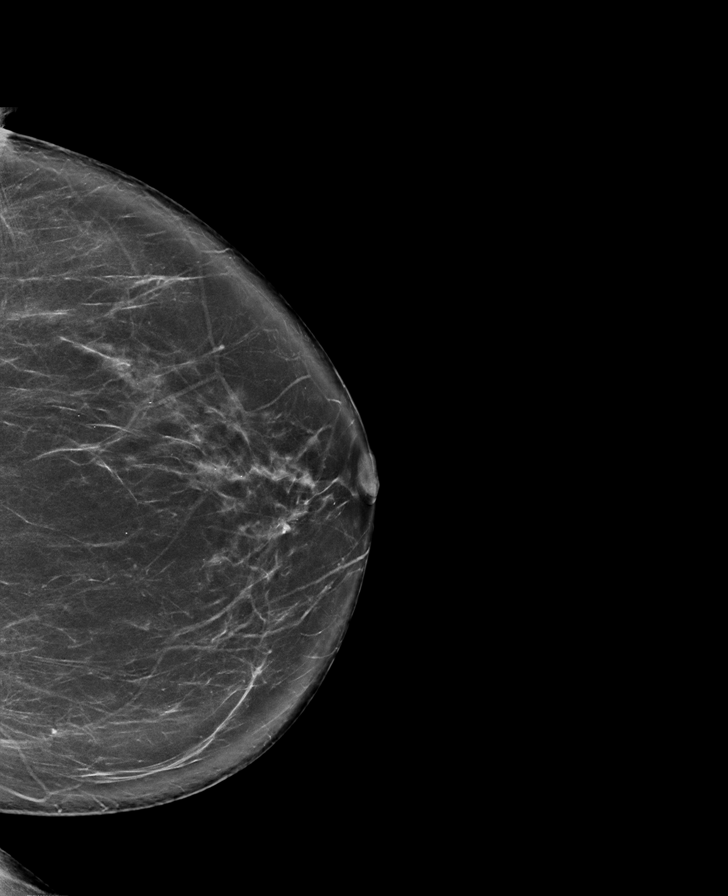

[R MLO tomo · tomo slice 46/91.0]
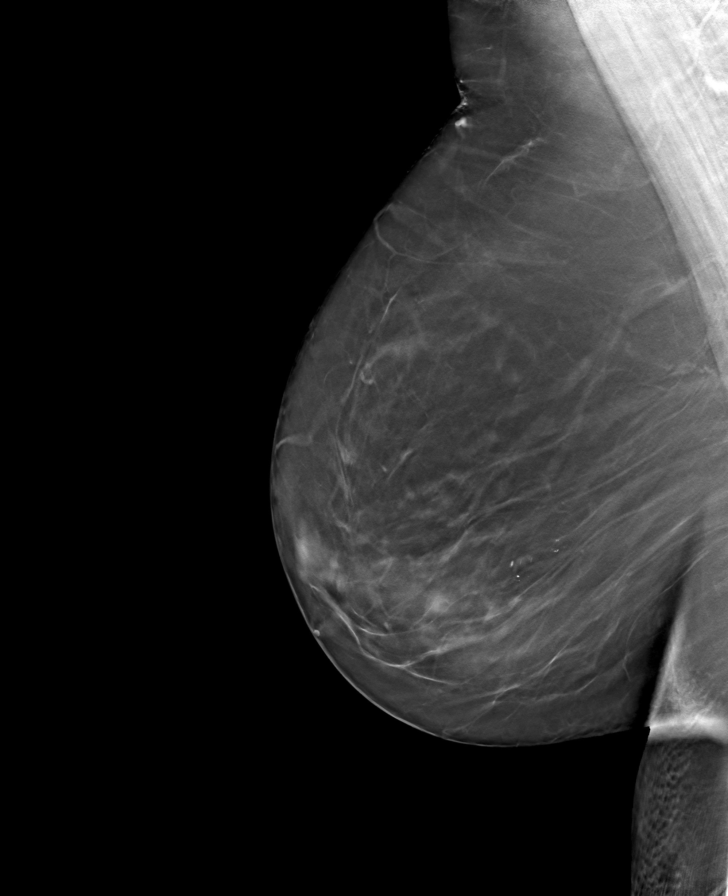

[L MLO tomo · tomo slice 45/88.0]
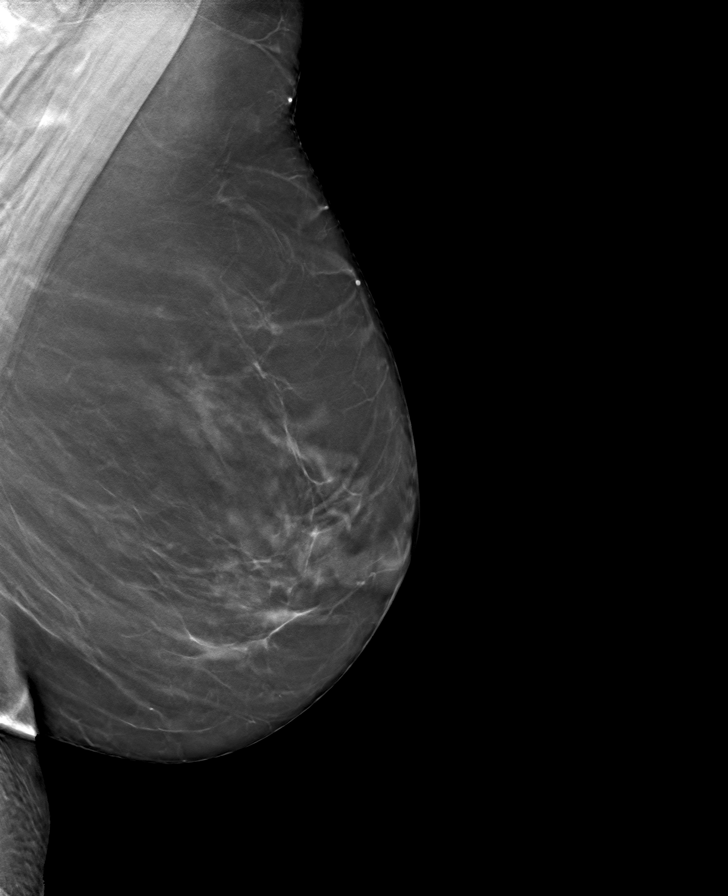

[L CC tomo · tomo slice 43/84.0]
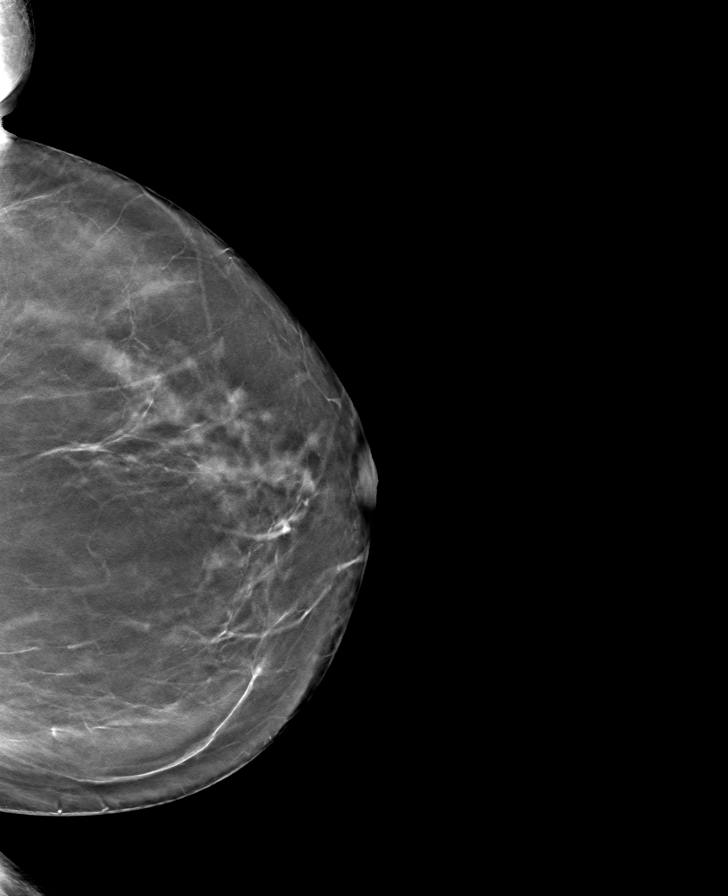

[R CC tomo · tomo slice 40/79.0]
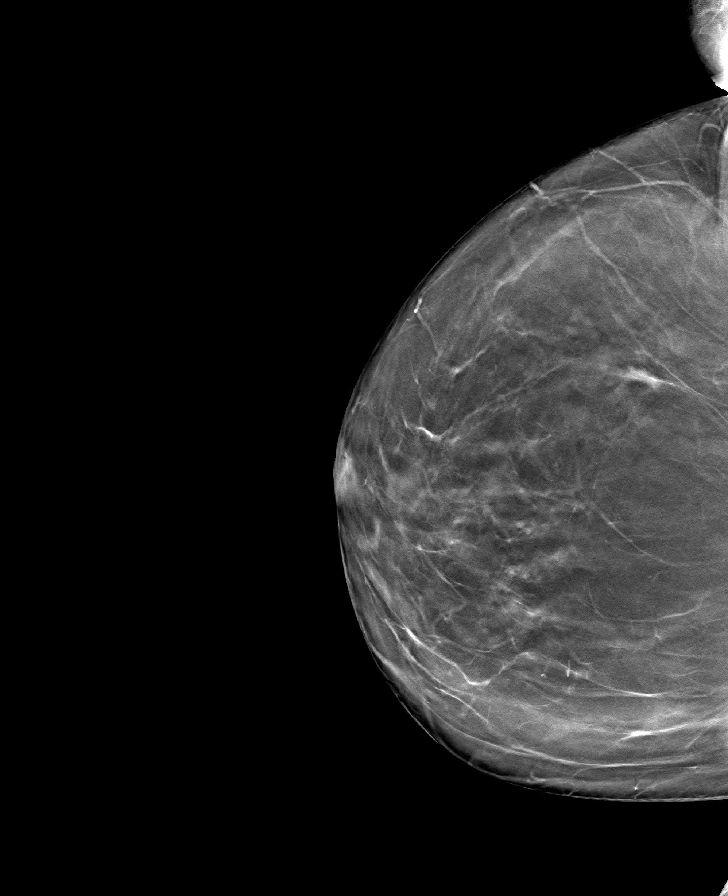

[8 of 24 positions shown; findings below may reference images not displayed]

ACR Breast Density Category b: There are scattered areas of
fibroglandular density.
FINDINGS: There are no findings suspicious for malignancy.
IMPRESSION: No mammographic evidence of malignancy. A result letter of this
screening mammogram will be mailed directly to the patient.

RECOMMENDATION:
Screening mammogram in one year. (Code:[id number]-[id number])

BI-RADS CATEGORY  1: Negative.

## 2022-11-27 NOTE — Telephone Encounter (Signed)
Last Fill: 10/09/2022 (tramadol), 06/08/2021 (Methocarbamol)  UDS:10/11/2022 Patient takes tramadol sparingly.  UDS negative for any other substances.   Narc Agreement: 01/03/2022  Next Visit: 04/19/2023  Last Visit: 10/11/2022  Dx: Myofascial pain syndrome   Current Dose per office note on 10/11/2022: not discussed   Okay to refill Tramadol and Methocarbamol?

## 2022-12-06 ENCOUNTER — Ambulatory Visit: Payer: 59 | Admitting: Physical Therapy

## 2022-12-08 ENCOUNTER — Ambulatory Visit: Payer: 59 | Admitting: Physical Therapy

## 2022-12-11 NOTE — Therapy (Signed)
OUTPATIENT PHYSICAL THERAPY TREATMENT NOTE   Patient Name: Brianna [name] MRN: [id number] DOB:[birth year], 57 y.o., female Today's Date: 12/12/2022  END OF SESSION:  PT End of Session - 12/12/22 1325     Visit Number 2    Number of Visits 17    Date for PT Re-Evaluation 01/16/23    Authorization Type [employer]    PT Start Time 1328    PT Stop Time 1410    PT Time Calculation (min) 42 min    Activity Tolerance Patient tolerated treatment well;No increased pain    Behavior During Therapy WFL for tasks assessed/performed              Past Medical History:  Diagnosis Date   Arthritis    Bursitis of hip    bilateral   Chronic headaches    Deviated nasal septum    Factor V Leiden [facility])    mother with h/o Factor V Leiden   Fibromyalgia    Gall bladder polyp    x3   GERD (gastroesophageal reflux disease)    HPV test positive 03/2012   neg Pap, +16/18 HPV   Mitral valve prolapse    Molluscum contagiosum    Myofascial pain 2013   Dr. Corliss Skains   Obesity    Osteopenia    Plantar fasciitis    Seizures (HCC)    Tendonitis of elbow, right    Past Surgical History:  Procedure Laterality Date   CESAREAN SECTION  1997, 2000   CHOLECYSTECTOMY N/A 07/03/2022   Procedure: LAPAROSCOPIC CHOLECYSTECTOMY;  Surgeon: Berna Bue, MD;  Location: [facility];  Service: General;  Laterality: N/A;   TEMPOROMANDIBULAR JOINT SURGERY Left 1988   WISDOM TOOTH EXTRACTION     Patient Active Problem List   Diagnosis Date Noted   Elevated alkaline phosphatase level 09/01/2021   Cracked lips 07/04/2020   Deviated septum 04/08/2019   Chronic nasal congestion 04/08/2019   Hypertrophy of both inferior nasal turbinates 04/08/2019   Myofascial pain syndrome 08/17/2016   HLA B27 (HLA B27 positive) 08/17/2016   Trochanteric bursitis of both hips 08/17/2016   History of migraine 08/17/2016   Primary osteoarthritis of both hands 08/17/2016   Primary osteoarthritis of both feet 08/17/2016    Plantar fasciitis 08/17/2016   Hyperparathyroidism (HCC) 09/20/2015   High risk HPV infection 05/19/2013    PCP: Shon Hale, MD  REFERRING PROVIDER: Pollyann Savoy, MD  REFERRING DIAG: M79.18 (ICD-10-CM) - Myofascial pain syndrome  Rationale for Evaluation and Treatment: Rehabilitation  THERAPY DIAG:  Cervicalgia  Pain of both hip joints  Muscle weakness (generalized)  ONSET DATE: several years  SUBJECTIVE:  Per eval - Pt endorses history of multi joint chronic pain, daily headaches, with most of her symptoms concentrated along neck/shoulders, low back, and especially BIL hips. States she tends to be able to push through pain during her daily activities, but symptoms catch up near the end of the day and at night. States historically she was fairly active prior to having children, would like to get back to doing more activities. She notes increased life stressors over the past few months which she states seems to have increased her recent pain levels, and historically she seems to have noticed more pain when she is stressed. Unable to describe specific activity triggers, exacerbations seem variable.   SUBJECTIVE STATEMENT: 12/12/2022 Pt states she is doing about the same overall, HEP going okay. A little bit of a headache which she states is common for her, feels she may have slept wrong. Otherwise no new updates  PERTINENT HISTORY:  hyperparathyroidism, multiple MSK issues, hx HPV, migraines, osteopenia   PAIN:  Are you having pain: 6/10 mid back pain Location/description: today mostly in shoulders, mid back, and wrists   Per eval -  Best-worst over past week: 4-8/10  - aggravating factors: prolonged sitting, increased activity at end of day  - Easing factors: rest, changing positions,  ice, heat   PRECAUTIONS: None  WEIGHT BEARING RESTRICTIONS: No  FALLS:  Has patient fallen in last 6 months? 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    DIAGNOSTIC FINDINGS:  Thoracic XR 10/30/22 IMPRESSION: No focal lesion to correlate with finding on bone scan. Abnormality on bone scan is likely due to facet arthropathy.  PATIENT SURVEYS:  FOTO 48 current, 52 predicted  SCREENING FOR RED FLAGS: Does not endorse any red flags  COGNITION: Overall cognitive status: Within functional limits for tasks assessed     SENSATION: No neuro complaints   POSTURE: mild forward head  PALPATION: deferred  LUMBAR ROM:   AROM eval  Flexion   Extension   Right lateral flexion   Left lateral flexion   Right rotation   Left rotation    (Blank rows = not tested)  RANGE OF MOTION:     Active  Right eval Left eval  Shoulder flexion/abduction WFL symmetrical WFL symmetrical   Functional ER combo Scapular spine, s  Scapular spine, s   Functional IR combo T3  T3  Knee extension    Ankle dorsiflexion     (Blank rows = not tested) (Key: WFL = within functional limits not formally assessed, * = concordant pain, s = stiffness/stretching sensation, NT = not tested)  Comments:    STRENGTH TESTING:  MMT Right eval Left eval  Shoulder flexion 5 5  Shoulder abduction 5 5  Shoulder extension 5 5  Elbow extension    Grip strength (gross)    Hip flexion 3+ 4  Hip abduction (modified sitting) 5 5  Knee flexion 5 5  Knee extension 5 5  Ankle dorsiflexion     Ankle plantarflexion     (Blank rows = not tested) (Key: WFL = within functional limits not  formally assessed, * = concordant pain, s = stiffness/stretching sensation, NT = not tested)  Comments: MMT painless per pt   FUNCTIONAL TESTS:  5xSTS: 10.87 sec gentle UE support no increase in pain TUG: 7 sec no UE support  GAIT: Distance walked: within clinic Assistive device utilized: None Level of assistance: Complete Independence Comments: step through pattern BIL, mildly reduced truncal rotation and arm swing   TODAY'S TREATMENT:                                                                                                                              [facility] Adult PT Treatment:                                                DATE: 12/12/22 Therapeutic Exercise: Scapular retractions 2x10 cues for reduced UT compensations STS 2x10 Hip ext at counter 2x10 BIL LE cues for posture Heel raises 2x10 at counter cues to limit compensations in sagittal plane Standing marches 2x10 BIL cues for posture Seated adduction isos 2x10 cues for breath control Standing shoulder ext isometrics w/ ball at wall, one arm at a time, 2x10 each cues for reduced compensations at UT  B double ER + scapular retraction 2x10 cues for form and elbow position HEP review/education  Therapeutic Activity: Discussion re: pain neuroscience education as it relates to functional mobility, symptom behavior, and activity tolerance Activity modification, pacing of activities, and strategies to maximize tolerance in context of walking program    [facility] Adult PT Treatment:                                                DATE: 11/21/22 Therapeutic Exercise: Scapular retraction x10 cues for reduced UT compensations Standing hip extensions x10 BIL cues for reduced knee compensations STS x10 w/ gentle UE support from chair HEP handout + education Education on relevant anatomy/physiology as it pertains to exercise in session, HEP, and  general exercise outside of sessions    PATIENT EDUCATION:  Education details: rationale for interventions, relevant anatomy/physiology Person educated: Patient Education method: Explanation, Demonstration, Tactile cues, Verbal cues Education comprehension: verbalized understanding, returned demonstration, verbal cues required, tactile cues required, and needs further education    HOME EXERCISE PROGRAM: Access Code: [email or web address] URL: [email or web address] Date: 11/21/2022 Prepared by: Fransisco Hertz  Exercises - Seated Scapular Retraction  - 1 x daily - 7 x weekly - 2 sets - 15 reps - Standing Hip Extension with Counter Support  - 1 x daily - 7 x weekly - 2 sets - 15 reps - Sit to Stand with Armchair  - 1 x daily - 7 x weekly - 2 sets - 10 reps  ASSESSMENT:  CLINICAL IMPRESSION: 12/12/2022 Pt arrives w/ 6/10 mid back pain, reports good adherence with HEP, no issues. Today focusing on progression of generalized strengthening program for LE, core, and periscapular musculature. Emphasis on comfortable movement and reducing compensations. Pt tolerates well overall without any increases in pain, no change in headache.  Also spent significant time with education as above, discussing relevant anatomy/physiology, pain neuroscience education, and activity modification strategies. No adverse events. Recommend continuing along current POC in order to address relevant deficits and improve functional tolerance. Pt departs today's session in no acute distress, all voiced questions/concerns addressed appropriately from PT perspective.     Per eval - Patient is a pleasant 57 y.o. woman who was seen today for physical therapy evaluation and treatment for myofascial pain syndrome per referral. Endorses longstanding history of multi-site pain that seems to typically concentrate in neck/shoulders, low back, and hips. Triggers are variable but tend to be worse at the end of active days. States her goal  is to be able to manage symptoms better to promote more sustainable activity levels and functional tolerance. On exam pt demos good UE strength and functional GH ROM, mild BIL hip weakness. Requires gentle UE support for 5xSTS although score is not indicative of fall risk. As pt states her pain tends to be more noticeable with prolonged activity, anticipate would benefit from generalized multi joint strengthening with emphasis on postural endurance. Given medical diagnosis of fibromyalgia and referring diagnosis of myofascial pain syndrome, also anticipate pt would benefit from graded walking program and education on sustainable activity habits, possible PNE. No adverse events, pt denies any increase in pain on departure. Recommend skilled PT to address aforementioned deficits with goal of improving functional tolerance, reducing pain levels, and promoting long term self efficacy with management of symptoms. Pt departs today's session in no acute distress, all voiced questions/concerns addressed appropriately from PT perspective.       OBJECTIVE IMPAIRMENTS: Abnormal gait, decreased activity tolerance, decreased endurance, decreased mobility, difficulty walking, decreased strength, impaired perceived functional ability, postural dysfunction, and pain.   ACTIVITY LIMITATIONS: carrying, lifting, bending, sitting, standing, squatting, stairs, transfers, and locomotion level  PARTICIPATION LIMITATIONS: meal prep, cleaning, laundry, community activity, and occupation  PERSONAL FACTORS: Time since onset of injury/illness/exacerbation and 3+ comorbidities: multiple MSK issues, migraines, hx HPV, osteopenia  are also affecting patient's functional outcome.   REHAB POTENTIAL: Fair given chronicity and comorbidities  CLINICAL DECISION MAKING: Stable/uncomplicated  EVALUATION COMPLEXITY: Low   GOALS: Goals reviewed with patient? Yes  SHORT TERM GOALS: Target date: 12/19/2022 Pt will demonstrate appropriate  understanding and performance of initially prescribed HEP in order to facilitate improved independence with management of symptoms.  Baseline: HEP provided on eval Goal status: INITIAL    LONG TERM GOALS: Target date: 01/16/2023 Pt will score 52 on FOTO in order to demonstrate improved perception of functional status due to symptoms.  Baseline: 48 Goal status: INITIAL  2.   Pt will demonstrate hip flexion MMT of 4+/5 bilaterally in order to demonstrate improved strength for functional movements.  Baseline: see MMT chart above Goal status: INITIAL  3.  Pt will perform 5xSTS in <8 sec in order to demonstrate reduced fall risk and improved functional independence. (MCID of 2.3sec)  Baseline: 10 sec gentle UE support from thighs  Goal status: INITIAL   4. Pt will demonstrate appropriate performance of final prescribed HEP in order to facilitate improved self-management of symptoms post-discharge.   Baseline: initial HEP prescribed  Goal status: INITIAL  5. Pt will report at least 50% decrease in overall pain levels in past week in order to facilitate improved tolerance to basic ADLs/mobility.   Baseline: 4-8/10 multi site over past week  Goal status: INITIAL    6. Pt will endorse ability to perform typical housework activities with less than 4/10 pain on NPS in order to promote improved tolerance to daily activities.   Baseline: up to 8/10 with daily activities  Goal status: INITIAL   PLAN:  PT FREQUENCY: 2x/week  PT DURATION: 8 weeks  PLANNED INTERVENTIONS: Therapeutic exercises, Therapeutic activity, Neuromuscular re-education, Balance training, Gait training, Patient/Family education, Self Care, Joint mobilization, Stair training, DME instructions, Aquatic Therapy, Dry Needling, Spinal mobilization, Cryotherapy, Moist heat, Taping, Manual therapy, and Re-evaluation.  PLAN FOR NEXT SESSION: review/update HEP PRN. Try treadmill walking for activity tolerance. Discuss walking  program. Continue generalized strengthening/endurance program, progress as tolerated pending post session response   Ashley Murrain PT, DPT 12/12/2022 2:34 PM

## 2022-12-12 ENCOUNTER — Ambulatory Visit: Payer: 59 | Attending: Rheumatology | Admitting: Physical Therapy

## 2022-12-12 ENCOUNTER — Encounter: Payer: Self-pay | Admitting: Physical Therapy

## 2022-12-12 DIAGNOSIS — M25552 Pain in left hip: Secondary | ICD-10-CM | POA: Diagnosis present

## 2022-12-12 DIAGNOSIS — M542 Cervicalgia: Secondary | ICD-10-CM | POA: Diagnosis present

## 2022-12-12 DIAGNOSIS — M25621 Stiffness of right elbow, not elsewhere classified: Secondary | ICD-10-CM | POA: Insufficient documentation

## 2022-12-12 DIAGNOSIS — M25551 Pain in right hip: Secondary | ICD-10-CM | POA: Insufficient documentation

## 2022-12-12 DIAGNOSIS — M79601 Pain in right arm: Secondary | ICD-10-CM | POA: Diagnosis present

## 2022-12-12 DIAGNOSIS — M6281 Muscle weakness (generalized): Secondary | ICD-10-CM | POA: Insufficient documentation

## 2022-12-13 NOTE — Therapy (Signed)
OUTPATIENT PHYSICAL THERAPY TREATMENT NOTE   Patient Name: Brianna [name] MRN: [id number] DOB:1965/11/07, 57 y.o., female Today's Date: 12/14/2022  END OF SESSION:  PT End of Session - 12/14/22 1319     Visit Number 3    Number of Visits 17    Date for PT Re-Evaluation 01/16/23    Authorization Type [employer]    PT Start Time 1320    PT Stop Time 1409    PT Time Calculation (min) 49 min    Activity Tolerance Patient tolerated treatment well;No increased pain    Behavior During Therapy WFL for tasks assessed/performed             Past Medical History:  Diagnosis Date   Arthritis    Bursitis of hip    bilateral   Chronic headaches    Deviated nasal septum    Factor V Leiden [facility])    mother with h/o Factor V Leiden   Fibromyalgia    Gall bladder polyp    x3   GERD (gastroesophageal reflux disease)    HPV test positive 03/2012   neg Pap, +16/18 HPV   Mitral valve prolapse    Molluscum contagiosum    Myofascial pain 2013   Dr. Corliss Skains   Obesity    Osteopenia    Plantar fasciitis    Seizures (HCC)    Tendonitis of elbow, right    Past Surgical History:  Procedure Laterality Date   CESAREAN SECTION  1997, 2000   CHOLECYSTECTOMY N/A 07/03/2022   Procedure: LAPAROSCOPIC CHOLECYSTECTOMY;  Surgeon: Berna Bue, MD;  Location: [facility];  Service: General;  Laterality: N/A;   TEMPOROMANDIBULAR JOINT SURGERY Left 1988   WISDOM TOOTH EXTRACTION     Patient Active Problem List   Diagnosis Date Noted   Elevated alkaline phosphatase level 09/01/2021   Cracked lips 07/04/2020   Deviated septum 04/08/2019   Chronic nasal congestion 04/08/2019   Hypertrophy of both inferior nasal turbinates 04/08/2019   Myofascial pain syndrome 08/17/2016   HLA B27 (HLA B27 positive) 08/17/2016   Trochanteric bursitis of both hips 08/17/2016   History of migraine 08/17/2016   Primary osteoarthritis of both hands 08/17/2016   Primary osteoarthritis of both feet 08/17/2016   Plantar  fasciitis 08/17/2016   Hyperparathyroidism (HCC) 09/20/2015   High risk HPV infection 05/19/2013    PCP: Shon Hale, MD  REFERRING PROVIDER: Pollyann Savoy, MD  REFERRING DIAG: M79.18 (ICD-10-CM) - Myofascial pain syndrome  Rationale for Evaluation and Treatment: Rehabilitation  THERAPY DIAG:  Cervicalgia  Pain of both hip joints  Muscle weakness (generalized)  ONSET DATE: several years  SUBJECTIVE:  Per eval - Pt endorses history of multi joint chronic pain, daily headaches, with most of her symptoms concentrated along neck/shoulders, low back, and especially BIL hips. States she tends to be able to push through pain during her daily activities, but symptoms catch up near the end of the day and at night. States historically she was fairly active prior to having children, would like to get back to doing more activities. She notes increased life stressors over the past few months which she states seems to have increased her recent pain levels, and historically she seems to have noticed more pain when she is stressed. Unable to describe specific activity triggers, exacerbations seem variable.   SUBJECTIVE STATEMENT: 12/14/2022 Feels about the same as usual, mild soreness/workout feeling after last session that resolved by next day. No other updates   PERTINENT HISTORY:  hyperparathyroidism, multiple MSK issues, hx HPV, migraines, osteopenia   PAIN:  Are you having pain: 6/10 mid back pain Location/description: today mostly in shoulders, mid back, and wrists   Per eval -  Best-worst over past week: 4-8/10  - aggravating factors: prolonged sitting, increased activity at end of day  - Easing factors: rest, changing positions, ice, heat   PRECAUTIONS: None  WEIGHT BEARING RESTRICTIONS:  No  FALLS:  Has patient fallen in last 6 months? 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    DIAGNOSTIC FINDINGS:  Thoracic XR 10/30/22 IMPRESSION: No focal lesion to correlate with finding on bone scan. Abnormality on bone scan is likely due to facet arthropathy.  PATIENT SURVEYS:  FOTO 48 current, 52 predicted  SCREENING FOR RED FLAGS: Does not endorse any red flags  COGNITION: Overall cognitive status: Within functional limits for tasks assessed     SENSATION: No neuro complaints   POSTURE: mild forward head  PALPATION: deferred  LUMBAR ROM:   AROM eval  Flexion   Extension   Right lateral flexion   Left lateral flexion   Right rotation   Left rotation    (Blank rows = not tested)  RANGE OF MOTION:     Active  Right eval Left eval  Shoulder flexion/abduction WFL symmetrical WFL symmetrical   Functional ER combo Scapular spine, s  Scapular spine, s   Functional IR combo T3  T3  Knee extension    Ankle dorsiflexion     (Blank rows = not tested) (Key: WFL = within functional limits not formally assessed, * = concordant pain, s = stiffness/stretching sensation, NT = not tested)  Comments:    STRENGTH TESTING:  MMT Right eval Left eval  Shoulder flexion 5 5  Shoulder abduction 5 5  Shoulder extension 5 5  Elbow extension    Grip strength (gross)    Hip flexion 3+ 4  Hip abduction (modified sitting) 5 5  Knee flexion 5 5  Knee extension 5 5  Ankle dorsiflexion    Ankle plantarflexion     (Blank rows = not tested) (Key: WFL =  within functional limits not formally assessed, * = concordant pain, s = stiffness/stretching sensation, NT = not tested)  Comments: MMT painless per pt   FUNCTIONAL TESTS:  5xSTS: 10.87 sec gentle UE support no increase in pain TUG: 7 sec no UE support  GAIT: Distance walked: within clinic Assistive device utilized: None Level of assistance: Complete Independence Comments: step through pattern BIL, mildly reduced truncal rotation and arm swing   TODAY'S TREATMENT:                                                                                                                              [facility] Adult PT Treatment:                                                DATE: 12/14/22 Therapeutic Exercise: Treadmill 4 min 1.40mph-2.0mph gradually increasing STS 5# 3x6 cues for trunk mechanics RTB hip ext standing 3x8 BIL LE cues for setup RTB hip abduction 2x8 BIL Heel raises 2x15 cues for pacing Standing marches 3# ankle weights 2x8 BIL  5# bicep curl 3x10 BIL RTB double ER + scapular retraction 2x8  HEP update + education   [facility] Adult PT Treatment:                                                DATE: 12/12/22 Therapeutic Exercise: Scapular retractions 2x10 cues for reduced UT compensations STS 2x10 Hip ext at counter 2x10 BIL LE cues for posture Heel raises 2x10 at counter cues to limit compensations in sagittal plane Standing marches 2x10 BIL cues for posture Seated adduction isos 2x10 cues for breath control Standing shoulder ext isometrics w/ ball at wall, one arm at a time, 2x10 each cues for reduced compensations at UT  B double ER + scapular retraction 2x10 cues for form and elbow position HEP review/education  Therapeutic Activity: Discussion re: pain neuroscience education as it relates to functional mobility, symptom behavior, and activity tolerance Activity modification, pacing of activities, and strategies to maximize tolerance in context of walking program    [facility] Adult PT  Treatment:                                                DATE: 11/21/22 Therapeutic Exercise: Scapular retraction x10 cues for reduced UT compensations Standing hip extensions x10 BIL cues for reduced knee compensations STS x10 w/ gentle UE support from chair HEP handout + education Education on relevant anatomy/physiology as it pertains to exercise in session, HEP, and general exercise outside of sessions    PATIENT EDUCATION:  Education details: rationale for interventions, relevant anatomy/physiology Person educated: Patient Education method: Explanation, Demonstration, Tactile cues,  Verbal cues Education comprehension: verbalized understanding, returned demonstration, verbal cues required, tactile cues required, and needs further education    HOME EXERCISE PROGRAM: Access Code: [email or web address] URL: [email or web address] Date: 12/14/2022 Prepared by: Fransisco Hertz  Exercises - Standing Hip Extension with Counter Support  - 1 x daily - 7 x weekly - 2 sets - 15 reps - Sit to Stand with Armchair  - 1 x daily - 7 x weekly - 2 sets - 10 reps - Heel Raises with Counter Support  - 1 x daily - 7 x weekly - 2 sets - 15 reps - Shoulder External Rotation and Scapular Retraction  - 1 x daily - 7 x weekly - 3 sets - 10 reps  ASSESSMENT:  CLINICAL IMPRESSION: 12/14/2022 Pt states she feels about the same as usual today, felt a little soreness after last session but no increase in pain. Today focusing on progression of strengthening program with incorporation of resistance, as well as introduction of treadmill work with variable pacing to begin walking program. Pt tolerates well, does endorse muscular fatigue with activity but no increase in pain. HEP update as above. No adverse events. Recommend continuing along current POC in order to address relevant deficits and improve functional tolerance. Pt departs today's session in no acute distress, all voiced questions/concerns addressed  appropriately from PT perspective.    Per eval - Patient is a pleasant 57 y.o. woman who was seen today for physical therapy evaluation and treatment for myofascial pain syndrome per referral. Endorses longstanding history of multi-site pain that seems to typically concentrate in neck/shoulders, low back, and hips. Triggers are variable but tend to be worse at the end of active days. States her goal is to be able to manage symptoms better to promote more sustainable activity levels and functional tolerance. On exam pt demos good UE strength and functional GH ROM, mild BIL hip weakness. Requires gentle UE support for 5xSTS although score is not indicative of fall risk. As pt states her pain tends to be more noticeable with prolonged activity, anticipate would benefit from generalized multi joint strengthening with emphasis on postural endurance. Given medical diagnosis of fibromyalgia and referring diagnosis of myofascial pain syndrome, also anticipate pt would benefit from graded walking program and education on sustainable activity habits, possible PNE. No adverse events, pt denies any increase in pain on departure. Recommend skilled PT to address aforementioned deficits with goal of improving functional tolerance, reducing pain levels, and promoting long term self efficacy with management of symptoms. Pt departs today's session in no acute distress, all voiced questions/concerns addressed appropriately from PT perspective.       OBJECTIVE IMPAIRMENTS: Abnormal gait, decreased activity tolerance, decreased endurance, decreased mobility, difficulty walking, decreased strength, impaired perceived functional ability, postural dysfunction, and pain.   ACTIVITY LIMITATIONS: carrying, lifting, bending, sitting, standing, squatting, stairs, transfers, and locomotion level  PARTICIPATION LIMITATIONS: meal prep, cleaning, laundry, community activity, and occupation  PERSONAL FACTORS: Time since onset of  injury/illness/exacerbation and 3+ comorbidities: multiple MSK issues, migraines, hx HPV, osteopenia  are also affecting patient's functional outcome.   REHAB POTENTIAL: Fair given chronicity and comorbidities  CLINICAL DECISION MAKING: Stable/uncomplicated  EVALUATION COMPLEXITY: Low   GOALS: Goals reviewed with patient? Yes  SHORT TERM GOALS: Target date: 12/19/2022 Pt will demonstrate appropriate understanding and performance of initially prescribed HEP in order to facilitate improved independence with management of symptoms.  Baseline: HEP provided on eval Goal status: INITIAL    LONG TERM GOALS: Target date: 01/16/2023 Pt  will score 52 on FOTO in order to demonstrate improved perception of functional status due to symptoms.  Baseline: 48 Goal status: INITIAL  2.   Pt will demonstrate hip flexion MMT of 4+/5 bilaterally in order to demonstrate improved strength for functional movements.  Baseline: see MMT chart above Goal status: INITIAL  3.  Pt will perform 5xSTS in <8 sec in order to demonstrate reduced fall risk and improved functional independence. (MCID of 2.3sec)  Baseline: 10 sec gentle UE support from thighs  Goal status: INITIAL   4. Pt will demonstrate appropriate performance of final prescribed HEP in order to facilitate improved self-management of symptoms post-discharge.   Baseline: initial HEP prescribed  Goal status: INITIAL    5. Pt will report at least 50% decrease in overall pain levels in past week in order to facilitate improved tolerance to basic ADLs/mobility.   Baseline: 4-8/10 multi site over past week  Goal status: INITIAL    6. Pt will endorse ability to perform typical housework activities with less than 4/10 pain on NPS in order to promote improved tolerance to daily activities.   Baseline: up to 8/10 with daily activities  Goal status: INITIAL   PLAN:  PT FREQUENCY: 2x/week  PT DURATION: 8 weeks  PLANNED INTERVENTIONS: Therapeutic  exercises, Therapeutic activity, Neuromuscular re-education, Balance training, Gait training, Patient/Family education, Self Care, Joint mobilization, Stair training, DME instructions, Aquatic Therapy, Dry Needling, Spinal mobilization, Cryotherapy, Moist heat, Taping, Manual therapy, and Re-evaluation.  PLAN FOR NEXT SESSION: review/update HEP PRN. treadmill walking for activity tolerance. Discuss walking program. Continue generalized strengthening/endurance program, progress as tolerated pending post session response   Ashley Murrain PT, DPT 12/14/2022 2:27 PM

## 2022-12-14 ENCOUNTER — Encounter: Payer: Self-pay | Admitting: Physical Therapy

## 2022-12-14 ENCOUNTER — Ambulatory Visit: Payer: 59 | Admitting: Physical Therapy

## 2022-12-14 DIAGNOSIS — M25552 Pain in left hip: Secondary | ICD-10-CM

## 2022-12-14 DIAGNOSIS — M25551 Pain in right hip: Secondary | ICD-10-CM

## 2022-12-14 DIAGNOSIS — M542 Cervicalgia: Secondary | ICD-10-CM

## 2022-12-14 DIAGNOSIS — M6281 Muscle weakness (generalized): Secondary | ICD-10-CM

## 2022-12-19 ENCOUNTER — Ambulatory Visit: Payer: 59 | Admitting: Physical Therapy

## 2022-12-19 ENCOUNTER — Encounter: Payer: Self-pay | Admitting: Physical Therapy

## 2022-12-19 DIAGNOSIS — M6281 Muscle weakness (generalized): Secondary | ICD-10-CM

## 2022-12-19 DIAGNOSIS — M79601 Pain in right arm: Secondary | ICD-10-CM

## 2022-12-19 DIAGNOSIS — M25552 Pain in left hip: Secondary | ICD-10-CM

## 2022-12-19 DIAGNOSIS — M25621 Stiffness of right elbow, not elsewhere classified: Secondary | ICD-10-CM

## 2022-12-19 DIAGNOSIS — M542 Cervicalgia: Secondary | ICD-10-CM | POA: Diagnosis not present

## 2022-12-19 NOTE — Therapy (Addendum)
 OUTPATIENT PHYSICAL THERAPY TREATMENT NOTE/DISCHARGE NOTE PHYSICAL THERAPY DISCHARGE SUMMARY  Visits from Start of Care: 4  Current functional level related to goals / functional outcomes: Unknown   Remaining deficits: unknown   Education / Equipment: HEP   Patient agrees to discharge. Patient goals were not met. Patient is being discharged due to not returning since the last visit.    Patient Name: Brianna [name] MRN: [id number] DOB:09/14/1965, 57 y.o., female Today's Date: 12/19/2022  END OF SESSION:  PT End of Session - 12/19/22 1332     Visit Number 4    Number of Visits 17    Date for PT Re-Evaluation 01/16/23    Authorization Type [employer]    PT Start Time 1331    PT Stop Time 1415    PT Time Calculation (min) 44 min    Activity Tolerance Patient tolerated treatment well;No increased pain    Behavior During Therapy WFL for tasks assessed/performed              Past Medical History:  Diagnosis Date   Arthritis    Bursitis of hip    bilateral   Chronic headaches    Deviated nasal septum    Factor V Leiden [facility])    mother with h/o Factor V Leiden   Fibromyalgia    Gall bladder polyp    x3   GERD (gastroesophageal reflux disease)    HPV test positive 03/2012   neg Pap, +16/18 HPV   Mitral valve prolapse    Molluscum contagiosum    Myofascial pain 2013   Dr. Corliss Skains   Obesity    Osteopenia    Plantar fasciitis    Seizures (HCC)    Tendonitis of elbow, right    Past Surgical History:  Procedure Laterality Date   CESAREAN SECTION  1997, 2000   CHOLECYSTECTOMY N/A 07/03/2022   Procedure: LAPAROSCOPIC CHOLECYSTECTOMY;  Surgeon: Berna Bue, MD;  Location: [facility];  Service: General;  Laterality: N/A;   TEMPOROMANDIBULAR JOINT SURGERY Left 1988   WISDOM TOOTH EXTRACTION     Patient Active Problem List   Diagnosis Date Noted   Elevated alkaline phosphatase level 09/01/2021   Cracked lips 07/04/2020   Deviated septum 04/08/2019   Chronic  nasal congestion 04/08/2019   Hypertrophy of both inferior nasal turbinates 04/08/2019   Myofascial pain syndrome 08/17/2016   HLA B27 (HLA B27 positive) 08/17/2016   Trochanteric bursitis of both hips 08/17/2016   History of migraine 08/17/2016   Primary osteoarthritis of both hands 08/17/2016   Primary osteoarthritis of both feet 08/17/2016   Plantar fasciitis 08/17/2016   Hyperparathyroidism (HCC) 09/20/2015   High risk HPV infection 05/19/2013    PCP: Shon Hale, MD  REFERRING PROVIDER: Pollyann Savoy, MD  REFERRING DIAG: M79.18 (ICD-10-CM) - Myofascial pain syndrome  Rationale for Evaluation and Treatment: Rehabilitation  THERAPY DIAG:  Cervicalgia  Pain of both hip joints  Muscle weakness (generalized)  Pain in right arm  Stiffness of right upper arm joint  ONSET DATE: several years  SUBJECTIVE:  Per eval - Pt endorses history of multi joint chronic pain, daily headaches, with most of her symptoms concentrated along neck/shoulders, low back, and especially BIL hips. States she tends to be able to push through pain during her daily activities, but symptoms catch up near the end of the day and at night. States historically she was fairly active prior to having children, would like to get back to doing more activities. She notes increased life stressors over the past few months which she states seems to have increased her recent pain levels, and historically she seems to have noticed more pain when she is stressed. Unable to describe specific activity triggers, exacerbations seem variable.   SUBJECTIVE STATEMENT: 12/19/2022 I am not having as bad of pain today I have been off of work now. Neck pain 5/10 today.  Back pain 3/10.  I walk for exercise one mile a day.   PERTINENT  HISTORY:  hyperparathyroidism, multiple MSK issues, hx HPV, migraines, osteopenia   PAIN:  Are you having pain: 6/10 mid back pain Location/description: today mostly in shoulders, mid back, and wrists   Per eval -  Best-worst over past week: 4-8/10  - aggravating factors: prolonged sitting, increased activity at end of day  - Easing factors: rest, changing positions, ice, heat   PRECAUTIONS: None  WEIGHT BEARING RESTRICTIONS: No  FALLS:  Has patient fallen in last 6 months? 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    DIAGNOSTIC FINDINGS:  Thoracic XR 10/30/22 IMPRESSION: No focal lesion to correlate with finding on bone scan. Abnormality on bone scan is likely due to facet arthropathy.  PATIENT SURVEYS:  FOTO 48 current, 52 predicted  SCREENING FOR RED FLAGS: Does not endorse any red flags  COGNITION: Overall cognitive status: Within functional limits for tasks assessed     SENSATION: No neuro complaints   POSTURE: mild forward head  PALPATION: deferred  LUMBAR ROM:   AROM eval  Flexion   Extension   Right lateral flexion   Left lateral flexion   Right rotation   Left rotation    (Blank rows = not tested)  RANGE OF MOTION:     Active  Right eval Left eval  Shoulder flexion/abduction WFL symmetrical WFL symmetrical   Functional ER combo Scapular spine, s  Scapular spine, s   Functional IR combo T3  T3  Knee extension    Ankle dorsiflexion     (Blank rows = not tested) (Key: WFL = within functional limits not formally assessed, * =  concordant pain, s = stiffness/stretching sensation, NT = not tested)  Comments:    STRENGTH TESTING:  MMT Right eval Left eval  Shoulder flexion 5 5  Shoulder abduction 5 5  Shoulder extension 5 5  Elbow extension    Grip strength (gross)    Hip flexion 3+ 4  Hip abduction (modified sitting) 5 5  Knee flexion 5 5  Knee extension 5 5  Ankle dorsiflexion    Ankle plantarflexion     (Blank rows = not tested) (Key: WFL = within functional limits not formally  assessed, * = concordant pain, s = stiffness/stretching sensation, NT = not tested)  Comments: MMT painless per pt   FUNCTIONAL TESTS:  5xSTS: 10.87 sec gentle UE support no increase in pain TUG: 7 sec no UE support  GAIT: Distance walked: within clinic Assistive device utilized: None Level of assistance: Complete Independence Comments: step through pattern BIL, mildly reduced truncal rotation and arm swing   TODAY'S TREATMENT:   [facility] Adult PT Treatment:                                                DATE: 12-19-22 Therapeutic Exercise: Treadmill 4 min 1.5 mph-2.43mph gradually increasing STS 5# 2x8 cues for  decreasing forward lean rest 90 sec between STS 10 #  AMRAP   14 to fatigue Bil heel raise 25 x  R heel raise max to 22 and L heel raise max to 21 7# DB on R bicep curl to 25 reps,  on L bicep curl to 23 and last 3 were compensating with momentum Overhead press with 10# KB bil 20X RTB double ER  2 x 15 Standing extension 2 x 15 RTB hip ext standing 2 x10 BIL LE cues for setup RTB hip abduction 2x10 BIL RTB bil shld resisted flexion   Self Care: Education on sleep hygiene and walking program /regular exercise                                                                                                                             [facility] Adult PT Treatment:                                                DATE: 12/14/22 Therapeutic Exercise: Treadmill 4 min 1.29mph-2.0mph gradually increasing STS 5# 3x6 cues for  trunk mechanics RTB hip ext standing 3x8 BIL LE cues for setup RTB hip abduction 2x8 BIL Heel raises 2x15 cues for pacing Standing marches 3# ankle weights 2x8 BIL  5# bicep curl 3x10 BIL RTB double ER + scapular retraction 2x8  HEP update + education   [facility] Adult PT Treatment:                                                DATE: 12/12/22 Therapeutic Exercise: Scapular retractions 2x10 cues for reduced UT compensations STS 2x10 Hip ext at counter 2x10 BIL LE cues for posture Heel raises 2x10 at counter cues to limit compensations in sagittal plane Standing marches 2x10 BIL cues for posture Seated adduction isos 2x10 cues for breath control Standing shoulder ext isometrics  w/ ball at wall, one arm at a time, 2x10 each cues for reduced compensations at UT  B double ER + scapular retraction 2x10 cues for form and elbow position HEP review/education  Therapeutic Activity: Discussion re: pain neuroscience education as it relates to functional mobility, symptom behavior, and activity tolerance Activity modification, pacing of activities, and strategies to maximize tolerance in context of walking program    [facility] Adult PT Treatment:                                                DATE: 11/21/22 Therapeutic Exercise: Scapular retraction x10 cues for reduced UT compensations Standing hip extensions x10 BIL cues for reduced knee compensations STS x10 w/ gentle UE support from chair HEP handout + education Education on relevant anatomy/physiology as it pertains to exercise in session, HEP, and general exercise outside of sessions    PATIENT EDUCATION:  Education details: rationale for interventions, relevant anatomy/physiology Person educated: Patient Education method: Explanation, Demonstration, Tactile cues, Verbal cues Education comprehension: verbalized understanding, returned demonstration, verbal cues required, tactile cues required, and needs further education    HOME EXERCISE  PROGRAM: Access Code: [email or web address] URL: [email or web address] Date: 12/14/2022 Prepared by: Fransisco Hertz  Exercises - Standing Hip Extension with Counter Support  - 1 x daily - 7 x weekly - 2 sets - 15 reps - Sit to Stand with Armchair  - 1 x daily - 7 x weekly - 2 sets - 10 reps - Heel Raises with Counter Support  - 1 x daily - 7 x weekly - 2 sets - 15 reps - Shoulder External Rotation and Scapular Retraction  - 1 x daily - 7 x weekly - 3 sets - 10 reps Added 12-19-22 - Scaption with Resistance  - 1 x daily - 7 x weekly - 2 sets - 15 reps - Side Stepping with Resistance at Feet  - 1 x daily - 7 x weekly - 2 sets - 10 reps ASSESSMENT:  CLINICAL IMPRESSION: 12/19/2022 Pt reports she has 5/10 neck pain and 3/10 back/hip pain today.  Pt was interested in TPDN but declined today.  Session today emphasized education on walking program and self monitoring as well as sleep hygience. Pt was challenged today to lift increasing weights for progressive overload of exercise.  Pt with decreased stiffness at end of session.  Pt does not fatigue with SL heel raise. And with added weights. Pt tolerates well, does endorse muscular fatigue with activity but no increase in pain. Updated HEP. No adverse events. Pt departs today's session in no acute distress, all voiced questions/concerns addressed appropriately from PT perspective.    Per eval - Patient is a pleasant 57 y.o. woman who was seen today for physical therapy evaluation and treatment for myofascial pain syndrome per referral. Endorses longstanding history of multi-site pain that seems to typically concentrate in neck/shoulders, low back, and hips. Triggers are variable but tend to be worse at the end of active days. States her goal is to be able to manage symptoms better to promote more sustainable activity levels and functional tolerance. On exam pt demos good UE strength and functional GH ROM, mild BIL hip weakness. Requires gentle UE support  for 5xSTS although score is not indicative of fall risk. As pt states her pain tends to be more noticeable with prolonged activity, anticipate would benefit  from generalized multi joint strengthening with emphasis on postural endurance. Given medical diagnosis of fibromyalgia and referring diagnosis of myofascial pain syndrome, also anticipate pt would benefit from graded walking program and education on sustainable activity habits, possible PNE. No adverse events, pt denies any increase in pain on departure. Recommend skilled PT to address aforementioned deficits with goal of improving functional tolerance, reducing pain levels, and promoting long term self efficacy with management of symptoms. Pt departs today's session in no acute distress, all voiced questions/concerns addressed appropriately from PT perspective.       OBJECTIVE IMPAIRMENTS: Abnormal gait, decreased activity tolerance, decreased endurance, decreased mobility, difficulty walking, decreased strength, impaired perceived functional ability, postural dysfunction, and pain.   ACTIVITY LIMITATIONS: carrying, lifting, bending, sitting, standing, squatting, stairs, transfers, and locomotion level  PARTICIPATION LIMITATIONS: meal prep, cleaning, laundry, community activity, and occupation  PERSONAL FACTORS: Time since onset of injury/illness/exacerbation and 3+ comorbidities: multiple MSK issues, migraines, hx HPV, osteopenia  are also affecting patient's functional outcome.   REHAB POTENTIAL: Fair given chronicity and comorbidities  CLINICAL DECISION MAKING: Stable/uncomplicated  EVALUATION COMPLEXITY: Low   GOALS: Goals reviewed with patient? Yes  SHORT TERM GOALS: Target date: 12/19/2022 Pt will demonstrate appropriate understanding and performance of initially prescribed HEP in order to facilitate improved independence with management of symptoms.  Baseline: HEP provided on eval 12-19-22  sleep hygiene , walking programs and  initial HEP independence Goal status: MET   LONG TERM GOALS: Target date: 01/16/2023 Pt will score 52 on FOTO in order to demonstrate improved perception of functional status due to symptoms.  Baseline: 48 Goal status: INITIAL  2.   Pt will demonstrate hip flexion MMT of 4+/5 bilaterally in order to demonstrate improved strength for functional movements.  Baseline: see MMT chart above Goal status: INITIAL  3.  Pt will perform 5xSTS in <8 sec in order to demonstrate reduced fall risk and improved functional independence. (MCID of 2.3sec)  Baseline: 10 sec gentle UE support from thighs  Goal status: INITIAL   4. Pt will demonstrate appropriate performance of final prescribed HEP in order to facilitate improved self-management of symptoms post-discharge.   Baseline: initial HEP prescribed  Goal status: INITIAL    5. Pt will report at least 50% decrease in overall pain levels in past week in order to facilitate improved tolerance to basic ADLs/mobility.   Baseline: 4-8/10 multi site over past week  Goal status: INITIAL    6. Pt will endorse ability to perform typical housework activities with less than 4/10 pain on NPS in order to promote improved tolerance to daily activities.   Baseline: up to 8/10 with daily activities  Goal status: INITIAL   PLAN:  PT FREQUENCY: 2x/week  PT DURATION: 8 weeks  PLANNED INTERVENTIONS: Therapeutic exercises, Therapeutic activity, Neuromuscular re-education, Balance training, Gait training, Patient/Family education, Self Care, Joint mobilization, Stair training, DME instructions, Aquatic Therapy, Dry Needling, Spinal mobilization, Cryotherapy, Moist heat, Taping, Manual therapy, and Re-evaluation.  PLAN FOR NEXT SESSION: review/update HEP PRN. treadmill walking for activity tolerance. Discuss walking program. Continue generalized strengthening/endurance program, progress as tolerated pending post session response   Garen Lah, PT,  ATRIC Certified Exercise Expert for the Aging Adult  12/19/22 2:28 PM Phone: [phone] Fax: [phone]   Garen Lah, PT, ATRIC Certified Exercise Expert for the Aging Adult  09/20/23 1:02 PM Phone: [phone] Fax: [phone]

## 2022-12-19 NOTE — Patient Instructions (Addendum)
WALKING  Walking is a great form of exercise to increase your strength, endurance and overall fitness.  A walking program can help you start slowly and gradually build endurance as you go.  Everyone's ability is different, so each person's starting point will be different.  You do not have to follow them exactly.  The are just samples. You should simply find out what's right for you and stick to that program.   In the beginning, you'll start off walking 2-3 times a day for short distances.  As you get stronger, you'll be walking further at just 1-2 times per day. Minimum amount of exercise AMA  is 150 to 300 min of moderate exericise  that would be 30 to 60 min of waking 5 x a week  A. You Can Walk For A Certain Length Of Time Each Day    Walk 5 minutes 3 times per day.  Increase 2 minutes every 2 days (3 times per day).  Work up to 25-30 minutes (1-2 times per day).   Example:   Day 1-2 5 minutes 3 times per day   Day 7-8 12 minutes 2-3 times per day   Day 13-14 25 minutes 1-2 times per day  B. You Can Walk For a Certain Distance Each Day     Distance can be substituted for time.    Example:   3 trips to mailbox (at road)   3 trips to corner of block   3 trips around the block  C. Go to local [employer] and use the track.    Walk for distance ____ around track  Or time _x___ minutes  D. Walk __x__ Jog ____ Run ___  Please only do the exercises that your therapist has initialed and dated   Bernett  what does not challenge does not change you.  Sleep Tips  from Blain Pais  Why we Sleep book   Keep a consistent sleep schedule. Get up at the same time every day, even on weekends or during vacations. Set a bedtime that is early enough for you to get at least 7 -9 hours of sleep. Don't go to bed unless you are sleepy.  If you don't fall asleep after 20 minutes, get out of bed.  Establish a relaxing bedtime routine.  Use your bed only for sleep and sex.  Make your bedroom  quiet and relaxing. Keep the room at a comfortable, cool temperature. 65 degrees Limit exposure to bright light in the evenings. Turn off electronic devices at least 30 minutes before bedtime. Don't eat a large meal before bedtime. If you are hungry at night, eat a light, healthy snack.  Exercise regularly and maintain a healthy diet.  Avoid consuming caffeine in the late afternoon or evening.  Avoid consuming alcohol before bedtime.  Reduce your fluid intake before bedtime. Garen Lah, PT, ATRIC Certified Exercise Expert for the Aging Adult  12/19/22 1:42 PM Phone: [phone] Fax: [phone]

## 2022-12-20 ENCOUNTER — Telehealth: Payer: Self-pay | Admitting: Physical Therapy

## 2022-12-22 ENCOUNTER — Ambulatory Visit: Payer: 59 | Admitting: Physical Therapy

## 2022-12-26 ENCOUNTER — Ambulatory Visit: Payer: 59 | Admitting: Physical Therapy

## 2022-12-28 ENCOUNTER — Ambulatory Visit: Payer: 59 | Admitting: Physical Therapy

## 2023-01-09 ENCOUNTER — Ambulatory Visit: Payer: 59 | Admitting: Physical Therapy

## 2023-01-11 ENCOUNTER — Ambulatory Visit: Payer: 59 | Admitting: Physical Therapy

## 2023-01-16 ENCOUNTER — Ambulatory Visit: Payer: 59 | Admitting: Physical Therapy

## 2023-01-18 ENCOUNTER — Ambulatory Visit: Payer: 59 | Admitting: Physical Therapy

## 2023-02-04 NOTE — Progress Notes (Signed)
Cardiology Clinic Note   Patient Name: Brianna [name] Date of Encounter: 02/06/2023  Primary Care Provider:  Shon Hale, MD Primary Cardiologist:  Thurmon Fair, MD  Patient Profile    57 year old female with history of Factor V Leiden, without clinical thromboembolic events, palpitations, with other history to include fibromyalgia, obesity, and chronic headaches with seizures.  ZIO monitor revealed ectopic atrial tachycardia but no significant structural abnormalities were noted on echo.  The episodes were brief and she was placed on nadolol as needed only.  Last seen by Dr. Royann Shivers on 10/13/2021.  With history of fibromyalgia she was not placed on any statin therapy and was advised on low-cholesterol diet.  Past Medical History    Past Medical History:  Diagnosis Date   Arthritis    Bursitis of hip    bilateral   Chronic headaches    Deviated nasal septum    Factor V Leiden [facility])    mother with h/o Factor V Leiden   Fibromyalgia    Gall bladder polyp    x3   GERD (gastroesophageal reflux disease)    HPV test positive 03/2012   neg Pap, +16/18 HPV   Mitral valve prolapse    Molluscum contagiosum    Myofascial pain 2013   Dr. Corliss Skains   Obesity    Osteopenia    Plantar fasciitis    Seizures (HCC)    Tendonitis of elbow, right    Past Surgical History:  Procedure Laterality Date   CESAREAN SECTION  1997, 2000   CHOLECYSTECTOMY N/A 07/03/2022   Procedure: LAPAROSCOPIC CHOLECYSTECTOMY;  Surgeon: Berna Bue, MD;  Location: [facility];  Service: General;  Laterality: N/A;   TEMPOROMANDIBULAR JOINT SURGERY Left 1988   WISDOM TOOTH EXTRACTION      Allergies  Allergies  Allergen Reactions   Z-Pak [Azithromycin]     GI upset   Amoxicillin Rash    Yeast Infection     Augmentin [Amoxicillin-Pot Clavulanate]     Yeast Infection    Sulfa Antibiotics Rash    History of Present Illness    Mrs. Brianna [name] returns today for ongoing assessment and  management of palpitations diagnosed as ectopic atrial tachycardia, borderline hyperlipidemia.  She is not on statin therapy currently.  Since being seen last, her father died in 2023-07-19, who is also a patient of Dr. Royann Shivers, she continues to mourn this and be under stress concerning this loss, along with stress at work.  She states she has had 3 episodes over the last 5 months which she describes as "spasms" in her chest lasting less than a minute.  Most recent when she went to a store pushing a cart.  She denied any dizziness or diaphoresis associated.  It passed on its own after taking deep breaths.  She did have a little bit of racing heart rate concerning this incident but this normalized after it passed.  She has not had to take very many doses of the nadolol for rapid heart rate.  In fact she still has plenty in her initial bottle.  Labs are provided by PCP with management of hyperlipidemia.  As stated she is not a candidate for statin therapy.   Home Medications    Current Outpatient Medications  Medication Sig Dispense Refill   acetaminophen (TYLENOL) 500 MG tablet Take 1,000 mg by mouth every 8 (eight) hours as needed for moderate pain.     azelastine (ASTELIN) 0.1 % nasal spray Place 1 spray into both nostrils 2 (two)  times daily.     Coenzyme Q10 (CO Q 10) 100 MG CAPS Take 100 mg by mouth daily.     fluticasone (FLONASE) 50 MCG/ACT nasal spray Place 1 spray into both nostrils daily.     ibuprofen (ADVIL) 200 MG tablet Take 400 mg by mouth every 8 (eight) hours as needed (pain).     levocetirizine (XYZAL) 5 MG tablet Take 5 mg by mouth every evening.     Magnesium 500 MG CAPS Take 500 mg by mouth daily.     methocarbamol (ROBAXIN) 500 MG tablet TAKE 1 TABLET BY MOUTH AT 7 AM AND AT 2 PM AS NEEDED 60 tablet 3   Misc Natural Products (TART CHERRY ADVANCED PO) Take 1 capsule by mouth daily.     montelukast (SINGULAIR) 10 MG tablet Take 10 mg by mouth daily.     Multiple  Vitamins-Minerals (MULTIVITAMIN WITH MINERALS) tablet Take 1 tablet by mouth daily.     omeprazole (PRILOSEC) 20 MG capsule Take 20 mg by mouth daily as needed (acid reflux).     Probiotic Product (PROBIOTIC DAILY PO) Take 1 capsule by mouth daily.     traMADol (ULTRAM) 50 MG tablet TAKE 1 TABLET(50 MG) BY MOUTH DAILY AS NEEDED 30 tablet 0   vitamin B-12 (CYANOCOBALAMIN) 500 MCG tablet Take 500 mcg by mouth daily.     Vitamin D, Ergocalciferol, (DRISDOL) 1.25 MG (50000 UNIT) CAPS capsule Take 1 capsule (50,000 Units total) by mouth every 14 (fourteen) days. 6 capsule 4   nadolol (CORGARD) 40 MG tablet Take 1 tablet (40 mg total) by mouth daily as needed (Heart palpitations). 30 tablet 3   No current facility-administered medications for this visit.     Family History    Family History  Problem Relation Age of Onset   COPD Mother    Rheum arthritis Mother    Hyperlipidemia Mother    Heart attack Mother        01/2016   Colon polyps Mother    Factor V Leiden deficiency Mother    Diabetes Father    Hypertension Father    Hyperlipidemia Father    Parkinson's disease Father    Colon polyps Sister 43       pre-cancerous polyps-had surgery   Heart disease Sister    Graves' disease Sister    Hashimoto's thyroiditis Sister    Factor V Leiden deficiency Sister    Breast cancer Maternal Aunt    Stomach cancer Paternal Aunt    Factor V Leiden deficiency Son    Hyperparathyroidism Neg Hx    She indicated that her mother is alive. She indicated that her father is alive. She indicated that her sister is alive. She indicated that her brother is alive. She indicated that both of her sons are alive. She indicated that her maternal aunt is deceased. She indicated that her paternal aunt is deceased. She indicated that the status of her neg hx is unknown.  Social History    Social History   Socioeconomic History   Marital status: Married    Spouse name: Brianna [name]   Number of children: 2    Years of education: Not on file   Highest education level: Associate degree: academic program  Occupational History   Occupation: Accounting  Tobacco Use   Smoking status: Never    Passive exposure: Past   Smokeless tobacco: Never  Vaping Use   Vaping status: Never Used  Substance and Sexual Activity   Alcohol use: Yes  Comment: rarely   Drug use: No   Sexual activity: Yes    Partners: Male    Birth control/protection: Other-see comments, Post-menopausal    Comment: vasectomy  Other Topics Concern   Not on file  Social History Narrative   Patient is right-handed. She lives with her husband in a 2 level home. She drinks tea 1-2 x a day. And an occasional soda. She walks most days for 30 minutes.   Social Determinants of Health   Financial Resource Strain: Not on file  Food Insecurity: Not on file  Transportation Needs: Not on file  Physical Activity: Not on file  Stress: Not on file  Social Connections: Not on file  Intimate Partner Violence: Not on file     Review of Systems    General:  No chills, fever, night sweats or weight changes.  Cardiovascular: Positive for chest spasms, dyspnea on exertion, edema, orthopnea, palpitations, paroxysmal nocturnal dyspnea. Dermatological: No rash, lesions/masses Respiratory: No cough, dyspnea Urologic: No hematuria, dysuria Abdominal:   No nausea, vomiting, diarrhea, bright red blood per rectum, melena, or hematemesis Neurologic:  No visual changes, wkns, changes in mental status. All other systems reviewed and are otherwise negative except as noted above.  EKG Interpretation Date/Time:  Tuesday February 06 2023 08:59:36 EDT Ventricular Rate:  54 PR Interval:  180 QRS Duration:  86 QT Interval:  412 QTC Calculation: 390 R Axis:   22  Text Interpretation: Sinus bradycardia Confirmed by Joni Reining [phone]) on 02/06/2023 9:29:50 AM    Physical Exam    VS:  BP 106/68   Pulse (!) 54   Ht 5\' 9"  (1.753 m)   Wt 213 lb 3.2  oz (96.7 kg)   LMP 12/22/2012   SpO2 99%   BMI 31.48 kg/m  , BMI Body mass index is 31.48 kg/m.     GEN: Well nourished, well developed, in no acute distress. HEENT: normal. Neck: Supple, no JVD, carotid bruits, or masses. Cardiac: RRR, bradycardic, split S2, no murmurs, rubs, or gallops. No clubbing, cyanosis, edema.  Radials/DP/PT 2+ and equal bilaterally.  Respiratory:  Respirations regular and unlabored, clear to auscultation bilaterally. GI: Soft, nontender, nondistended, BS + x 4. MS: no deformity or atrophy. Skin: warm and dry, no rash. Neuro:  Strength and sensation are intact. Psych: Normal affect.  EKG Interpretation Date/Time:  Tuesday February 06 2023 08:59:36 EDT Ventricular Rate:  54 PR Interval:  180 QRS Duration:  86 QT Interval:  412 QTC Calculation: 390 R Axis:   22  Text Interpretation: Sinus bradycardia Confirmed by Joni Reining [phone]) on 02/06/2023 9:29:50 AM   Lab Results  Component Value Date   WBC 4.9 06/27/2022   HGB 13.0 06/27/2022   HCT 39.9 06/27/2022   MCV 95.7 06/27/2022   PLT 283 06/27/2022   Lab Results  Component Value Date   CREATININE 0.69 08/29/2022   BUN 14 08/29/2022   NA 142 08/29/2022   K 4.2 08/29/2022   CL 106 08/29/2022   CO2 22 08/29/2022   Lab Results  Component Value Date   ALT 13 08/29/2022   AST 17 08/29/2022   GGT 20 07/12/2021   ALKPHOS 169 (H) 08/29/2022   BILITOT 0.3 08/29/2022   Lab Results  Component Value Date   CHOL 219 (H) 06/20/2021   HDL 68 06/20/2021   LDLCALC 139 (H) 06/20/2021   TRIG 69 06/20/2021   CHOLHDL 3.2 06/20/2021    Lab Results  Component Value Date   HGBA1C 5.2  06/20/2021     Review of Prior Studies EKG Interpretation Date/Time:  Tuesday February 06 2023 08:59:36 EDT Ventricular Rate:  54 PR Interval:  180 QRS Duration:  86 QT Interval:  412 QTC Calculation: 390 R Axis:   22  Text Interpretation: Sinus bradycardia Confirmed by Joni Reining [phone]) on 02/06/2023  9:29:50 AM    Zio Monitor 06/20/2022   Predominant rhythm is normal sinus with normal circadian variation.   There is a single 15 beat episode of paroxysmal atrial tachycardia.   There is no atrial fibrilation, severe bradycardia or significant ventricular arrhythmia   Minor abnormalities on event monitor, limited to a single 15 beat episode of paroxysmal atrial tachycardia  Coronary CTA 11/24/2020 FINDINGS: Coronary arteries: Normal origins.   Coronary Calcium Score:   Left main: 0   Left anterior descending artery: 0   Left circumflex artery: 0   Right coronary artery: 0   Total: 0   Percentile: 0   Pericardium: Normal.   Ascending Aorta: Normal caliber.   Non-cardiac: See separate report from [facility].   IMPRESSION: Coronary calcium score of 0. This was 0 percentile for age-, race-, and sex-matched controls.    Assessment & Plan   1.  Ectopic atrial tachycardia: Patient only uses nadolol as needed and has not used it very often lately.  She is given refills today.  Heart rate is bradycardic today not on any AV nodal blocking agents.  Will continue to monitor this.  She is advised if she becomes dizzy, lightheaded or having presyncopal episodes she is to report this to [place] immediately.  2.  Hypercholesterolemia: Patient is reluctant to start any statin therapy with history of fibromyalgia.  Primary care provider is following up with labs.  She has had elevated LFTs in the past.  Low-cholesterol diet, purposeful exercise, and weight loss would be helpful concerning cholesterol management.  Addition of fish oil, would be helpful addition to her regimen.  3.  Chest wall spasms: The patient has had 3 episodes of this over the last 5 months.  Patient states generally lasts 1 minute and goes away on its own.  Doubt cardiac in etiology with normal coronary CTA.  May be related to muscle spasms or GI etiology.  If continues should follow-up with PCP for further  evaluation and recommendations for referral.       Signed, Bettey Mare. Liborio Nixon, ANP, AACC   02/06/2023 10:26 AM      Office [phone] Fax [phone]  Notice: This dictation was prepared with Dragon dictation along with smaller phrase technology. Any transcriptional errors that result from this process are unintentional and may not be corrected upon review.

## 2023-02-06 ENCOUNTER — Encounter: Payer: Self-pay | Admitting: Adult Health

## 2023-02-06 ENCOUNTER — Ambulatory Visit: Payer: 59 | Attending: Adult Health | Admitting: Adult Health

## 2023-02-06 VITALS — BP 106/68 | HR 54 | Ht 69.0 in | Wt 213.2 lb

## 2023-02-06 DIAGNOSIS — I4719 Other supraventricular tachycardia: Secondary | ICD-10-CM

## 2023-02-06 DIAGNOSIS — E7800 Pure hypercholesterolemia, unspecified: Secondary | ICD-10-CM

## 2023-02-06 DIAGNOSIS — R079 Chest pain, unspecified: Secondary | ICD-10-CM | POA: Diagnosis not present

## 2023-02-06 MED ORDER — NADOLOL 40 MG PO TABS: 40.0000 mg | ORAL_TABLET | Freq: Every day | ORAL | 3 refills | Status: DC | PRN

## 2023-02-06 NOTE — Patient Instructions (Signed)
Medication Instructions:  Your medication has been sent to your pharmacy  *If you need a refill on your cardiac medications before your next appointment, please call your pharmacy*   Lab Work: none If you have labs (blood work) drawn today and your tests are completely normal, you will receive your results only by: MyChart Message (if you have MyChart) OR A paper copy in the mail If you have any lab test that is abnormal or we need to change your treatment, we will call you to review the results.   Testing/Procedures: none   Follow-Up: At [facility], you and your health needs are our priority.  As part of our continuing mission to provide you with exceptional heart care, we have created designated Provider Care Teams.  These Care Teams include your primary Cardiologist (physician) and Advanced Practice Providers (APPs -  Physician Assistants and Nurse Practitioners) who all work together to provide you with the care you need, when you need it.  We recommend signing up for the patient portal called "MyChart".  Sign up information is provided on this After Visit Summary.  MyChart is used to connect with patients for Virtual Visits (Telemedicine).  Patients are able to view lab/test results, encounter notes, upcoming appointments, etc.  Non-urgent messages can be sent to your provider as well.   To learn more about what you can do with MyChart, go to [email or web address].    Your next appointment:   1 year(s)  Provider:   Thurmon Fair, MD

## 2023-02-11 ENCOUNTER — Other Ambulatory Visit: Payer: Self-pay | Admitting: Physician Assistant

## 2023-02-12 NOTE — Telephone Encounter (Signed)
Last Fill: 11/27/2022   UDS:10/11/2022 Patient takes tramadol sparingly.  UDS negative for any other substances.    Narc Agreement: 01/03/2022   Next Visit: 04/19/2023   Last Visit: 10/11/2022   Dx: Myofascial pain syndrome    Current Dose per office note on 10/11/2022: not discussed     Okay to refill Tramadol?

## 2023-03-30 ENCOUNTER — Telehealth: Payer: 59 | Admitting: Nurse Practitioner

## 2023-03-30 DIAGNOSIS — J0140 Acute pansinusitis, unspecified: Secondary | ICD-10-CM | POA: Diagnosis not present

## 2023-03-30 MED ORDER — DOXYCYCLINE HYCLATE 100 MG PO TABS: 100.0000 mg | ORAL_TABLET | Freq: Two times a day (BID) | ORAL | 0 refills | Status: AC

## 2023-03-30 NOTE — Progress Notes (Signed)
E-Visit for Sinus Problems  We are sorry that you are not feeling well.  Here is how we plan to help!  Based on what you have shared with me it looks like you have sinusitis.  Sinusitis is inflammation and infection in the sinus cavities of the head.  Based on your presentation I believe you most likely have Acute Bacterial Sinusitis.  This is an infection caused by bacteria and is treated with antibiotics. I have prescribed Doxycycline 100mg by mouth twice a day for 10 days. You may use an oral decongestant such as Mucinex D or if you have glaucoma or high blood pressure use plain Mucinex. Saline nasal spray help and can safely be used as often as needed for congestion.  If you develop worsening sinus pain, fever or notice severe headache and vision changes, or if symptoms are not better after completion of antibiotic, please schedule an appointment with a health care provider.    Sinus infections are not as easily transmitted as other respiratory infection, however we still recommend that you avoid close contact with loved ones, especially the very young and elderly.  Remember to wash your hands thoroughly throughout the day as this is the number one way to prevent the spread of infection!  Home Care: Only take medications as instructed by your medical team. Complete the entire course of an antibiotic. Do not take these medications with alcohol. A steam or ultrasonic humidifier can help congestion.  You can place a towel over your head and breathe in the steam from hot water coming from a faucet. Avoid close contacts especially the very young and the elderly. Cover your mouth when you cough or sneeze. Always remember to wash your hands.  Get Help Right Away If: You develop worsening fever or sinus pain. You develop a severe head ache or visual changes. Your symptoms persist after you have completed your treatment plan.  Make sure you Understand these instructions. Will watch your  condition. Will get help right away if you are not doing well or get worse.  Thank you for choosing an e-visit.  Your e-visit answers were reviewed by a board certified advanced clinical practitioner to complete your personal care plan. Depending upon the condition, your plan could have included both over the counter or prescription medications.  Please review your pharmacy choice. Make sure the pharmacy is open so you can pick up prescription now. If there is a problem, you may contact your provider through MyChart messaging and have the prescription routed to another pharmacy.  Your safety is important to us. If you have drug allergies check your prescription carefully.   For the next 24 hours you can use MyChart to ask questions about today's visit, request a non-urgent call back, or ask for a work or school excuse. You will get an email in the next two days asking about your experience. I hope that your e-visit has been valuable and will speed your recovery.   Meds ordered this encounter  Medications   doxycycline (VIBRA-TABS) 100 MG tablet    Sig: Take 1 tablet (100 mg total) by mouth 2 (two) times daily for 10 days.    Dispense:  20 tablet    Refill:  0    I spent approximately 5 minutes reviewing the patient's history, current symptoms and coordinating their care today.   

## 2023-04-06 NOTE — Progress Notes (Deleted)
Office Visit Note  Patient: Brianna [name]             Date of Birth: [birth year]           MRN: [id number]             PCP: Shon Hale, MD Referring: Shon Hale, * Visit Date: 04/19/2023 Occupation: @GUAROCC @  Subjective:  No chief complaint on file.   History of Present Illness: Brianna [name] is a 57 y.o. female ***     Activities of Daily Living:  Patient reports morning stiffness for *** {minute/hour:19697}.   Patient {ACTIONS;DENIES/REPORTS:21021675::"Denies"} nocturnal pain.  Difficulty dressing/grooming: {ACTIONS;DENIES/REPORTS:21021675::"Denies"} Difficulty climbing stairs: {ACTIONS;DENIES/REPORTS:21021675::"Denies"} Difficulty getting out of chair: {ACTIONS;DENIES/REPORTS:21021675::"Denies"} Difficulty using hands for taps, buttons, cutlery, and/or writing: {ACTIONS;DENIES/REPORTS:21021675::"Denies"}  No Rheumatology ROS completed.   PMFS History:  Patient Active Problem List   Diagnosis Date Noted   Elevated alkaline phosphatase level 09/01/2021   Cracked lips 07/04/2020   Deviated septum 04/08/2019   Chronic nasal congestion 04/08/2019   Hypertrophy of both inferior nasal turbinates 04/08/2019   Myofascial pain syndrome 08/17/2016   HLA B27 (HLA B27 positive) 08/17/2016   Trochanteric bursitis of both hips 08/17/2016   History of migraine 08/17/2016   Primary osteoarthritis of both hands 08/17/2016   Primary osteoarthritis of both feet 08/17/2016   Plantar fasciitis 08/17/2016   Hyperparathyroidism (HCC) 09/20/2015   High risk HPV infection 05/19/2013    Past Medical History:  Diagnosis Date   Arthritis    Bursitis of hip    bilateral   Chronic headaches    Deviated nasal septum    Factor V Leiden [facility])    mother with h/o Factor V Leiden   Fibromyalgia    Gall bladder polyp    x3   GERD (gastroesophageal reflux disease)    HPV test positive 03/2012   neg Pap, +16/18 HPV   Mitral valve prolapse    Molluscum contagiosum     Myofascial pain 2013   Dr. Corliss Skains   Obesity    Osteopenia    Plantar fasciitis    Seizures (HCC)    Tendonitis of elbow, right     Family History  Problem Relation Age of Onset   COPD Mother    Rheum arthritis Mother    Hyperlipidemia Mother    Heart attack Mother        01/2016   Colon polyps Mother    Factor V Leiden deficiency Mother    Diabetes Father    Hypertension Father    Hyperlipidemia Father    Parkinson's disease Father    Colon polyps Sister 78       pre-cancerous polyps-had surgery   Heart disease Sister    Malillany Kazlauskas' disease Sister    Hashimoto's thyroiditis Sister    Factor V Leiden deficiency Sister    Breast cancer Maternal Aunt    Stomach cancer Paternal Aunt    Factor V Leiden deficiency Son    Hyperparathyroidism Neg Hx    Past Surgical History:  Procedure Laterality Date   CESAREAN SECTION  1997, 2000   CHOLECYSTECTOMY N/A 07/03/2022   Procedure: LAPAROSCOPIC CHOLECYSTECTOMY;  Surgeon: Berna Bue, MD;  Location: [facility];  Service: General;  Laterality: N/A;   TEMPOROMANDIBULAR JOINT SURGERY Left 1988   WISDOM TOOTH EXTRACTION     Social History   Social History Narrative   Patient is right-handed. She lives with her husband in a 2 level home. She drinks tea 1-2 x  a day. And an occasional soda. She walks most days for 30 minutes.   Immunization History  Administered Date(s) Administered   Influenza,inj,Quad PF,6+ Mos 04/22/2019, 06/26/2022   Influenza-Unspecified 05/18/2020, 05/20/2021   Moderna Sars-Covid-2 Vaccination 07/29/2019, 08/26/2019, 06/29/2020   Pfizer Covid-19 Vaccine Bivalent Booster 75yrs & up 04/07/2021   Tdap 07/30/2015   Zoster Recombinant(Shingrix) 08/24/2018, 06/20/2021     Objective: Vital Signs: LMP 12/22/2012    Physical Exam   Musculoskeletal Exam: ***  CDAI Exam: CDAI Score: -- Patient Global: --; Provider Global: -- Swollen: --; Tender: -- Joint Exam 04/19/2023   No joint exam has been documented  for this visit   There is currently no information documented on the homunculus. Go to the Rheumatology activity and complete the homunculus joint exam.  Investigation: No additional findings.  Imaging: No results found.  Recent Labs: Lab Results  Component Value Date   WBC 4.9 06/27/2022   HGB 13.0 06/27/2022   PLT 283 06/27/2022   NA 142 08/29/2022   K 4.2 08/29/2022   CL 106 08/29/2022   CO2 22 08/29/2022   GLUCOSE 86 08/29/2022   BUN 14 08/29/2022   CREATININE 0.69 08/29/2022   BILITOT 0.3 08/29/2022   ALKPHOS 169 (H) 08/29/2022   AST 17 08/29/2022   ALT 13 08/29/2022   PROT 6.4 08/29/2022   ALBUMIN 4.3 08/29/2022   CALCIUM 9.1 08/29/2022   GFRAA 110 05/16/2019    Speciality Comments: No specialty comments available.  Procedures:  No procedures performed Allergies: Z-pak [azithromycin], Amoxicillin, Augmentin [amoxicillin-pot clavulanate], and Sulfa antibiotics   Assessment / Plan:     Visit Diagnoses: No diagnosis found.  Orders: No orders of the defined types were placed in this encounter.  No orders of the defined types were placed in this encounter.   Face-to-face time spent with patient was *** minutes. Greater than 50% of time was spent in counseling and coordination of care.  Follow-Up Instructions: No follow-ups on file.   Ellen Henri, CMA  Note - This record has been created using Animal nutritionist.  Chart creation errors have been sought, but may not always  have been located. Such creation errors do not reflect on  the standard of medical care.

## 2023-04-19 ENCOUNTER — Ambulatory Visit: Payer: 59 | Admitting: Rheumatology

## 2023-04-19 DIAGNOSIS — Z5181 Encounter for therapeutic drug level monitoring: Secondary | ICD-10-CM

## 2023-04-19 DIAGNOSIS — M722 Plantar fascial fibromatosis: Secondary | ICD-10-CM

## 2023-04-19 DIAGNOSIS — Z8669 Personal history of other diseases of the nervous system and sense organs: Secondary | ICD-10-CM

## 2023-04-19 DIAGNOSIS — M7918 Myalgia, other site: Secondary | ICD-10-CM

## 2023-04-19 DIAGNOSIS — M19071 Primary osteoarthritis, right ankle and foot: Secondary | ICD-10-CM

## 2023-04-19 DIAGNOSIS — M7062 Trochanteric bursitis, left hip: Secondary | ICD-10-CM

## 2023-04-19 DIAGNOSIS — E559 Vitamin D deficiency, unspecified: Secondary | ICD-10-CM

## 2023-04-19 DIAGNOSIS — E213 Hyperparathyroidism, unspecified: Secondary | ICD-10-CM

## 2023-04-19 DIAGNOSIS — R748 Abnormal levels of other serum enzymes: Secondary | ICD-10-CM

## 2023-04-19 DIAGNOSIS — M19042 Primary osteoarthritis, left hand: Secondary | ICD-10-CM

## 2023-04-19 DIAGNOSIS — Z1589 Genetic susceptibility to other disease: Secondary | ICD-10-CM

## 2023-04-19 DIAGNOSIS — M62838 Other muscle spasm: Secondary | ICD-10-CM

## 2023-04-23 NOTE — Progress Notes (Deleted)
Office Visit Note  Patient: Brianna [name]             Date of Birth: [birth year]           MRN: [id number]             PCP: Shon Hale, MD Referring: Shon Hale, * Visit Date: 04/24/2023 Occupation: @GUAROCC @  Subjective:  No chief complaint on file.   History of Present Illness: Brianna [name] is a 57 y.o. female ***     Activities of Daily Living:  Patient reports morning stiffness for *** {minute/hour:19697}.   Patient {ACTIONS;DENIES/REPORTS:21021675::"Denies"} nocturnal pain.  Difficulty dressing/grooming: {ACTIONS;DENIES/REPORTS:21021675::"Denies"} Difficulty climbing stairs: {ACTIONS;DENIES/REPORTS:21021675::"Denies"} Difficulty getting out of chair: {ACTIONS;DENIES/REPORTS:21021675::"Denies"} Difficulty using hands for taps, buttons, cutlery, and/or writing: {ACTIONS;DENIES/REPORTS:21021675::"Denies"}  No Rheumatology ROS completed.   PMFS History:  Patient Active Problem List   Diagnosis Date Noted   Elevated alkaline phosphatase level 09/01/2021   Cracked lips 07/04/2020   Deviated septum 04/08/2019   Chronic nasal congestion 04/08/2019   Hypertrophy of both inferior nasal turbinates 04/08/2019   Myofascial pain syndrome 08/17/2016   HLA B27 (HLA B27 positive) 08/17/2016   Trochanteric bursitis of both hips 08/17/2016   History of migraine 08/17/2016   Primary osteoarthritis of both hands 08/17/2016   Primary osteoarthritis of both feet 08/17/2016   Plantar fasciitis 08/17/2016   Hyperparathyroidism (HCC) 09/20/2015   High risk HPV infection 05/19/2013    Past Medical History:  Diagnosis Date   Arthritis    Bursitis of hip    bilateral   Chronic headaches    Deviated nasal septum    Factor V Leiden [facility])    mother with h/o Factor V Leiden   Fibromyalgia    Gall bladder polyp    x3   GERD (gastroesophageal reflux disease)    HPV test positive 03/2012   neg Pap, +16/18 HPV   Mitral valve prolapse    Molluscum contagiosum     Myofascial pain 2013   Dr. Corliss Skains   Obesity    Osteopenia    Plantar fasciitis    Seizures (HCC)    Tendonitis of elbow, right     Family History  Problem Relation Age of Onset   COPD Mother    Rheum arthritis Mother    Hyperlipidemia Mother    Heart attack Mother        01/2016   Colon polyps Mother    Factor V Leiden deficiency Mother    Diabetes Father    Hypertension Father    Hyperlipidemia Father    Parkinson's disease Father    Colon polyps Sister 64       pre-cancerous polyps-had surgery   Heart disease Sister    Raylin Diguglielmo' disease Sister    Hashimoto's thyroiditis Sister    Factor V Leiden deficiency Sister    Breast cancer Maternal Aunt    Stomach cancer Paternal Aunt    Factor V Leiden deficiency Son    Hyperparathyroidism Neg Hx    Past Surgical History:  Procedure Laterality Date   CESAREAN SECTION  1997, 2000   CHOLECYSTECTOMY N/A 07/03/2022   Procedure: LAPAROSCOPIC CHOLECYSTECTOMY;  Surgeon: Berna Bue, MD;  Location: [facility];  Service: General;  Laterality: N/A;   TEMPOROMANDIBULAR JOINT SURGERY Left 1988   WISDOM TOOTH EXTRACTION     Social History   Social History Narrative   Patient is right-handed. She lives with her husband in a 2 level home. She drinks tea 1-2 x  a day. And an occasional soda. She walks most days for 30 minutes.   Immunization History  Administered Date(s) Administered   Influenza,inj,Quad PF,6+ Mos 04/22/2019, 06/26/2022   Influenza-Unspecified 05/18/2020, 05/20/2021   Moderna Sars-Covid-2 Vaccination 07/29/2019, 08/26/2019, 06/29/2020   Pfizer Covid-19 Vaccine Bivalent Booster 59yrs & up 04/07/2021   Tdap 07/30/2015   Zoster Recombinant(Shingrix) 08/24/2018, 06/20/2021     Objective: Vital Signs: LMP 12/22/2012    Physical Exam   Musculoskeletal Exam: ***  CDAI Exam: CDAI Score: -- Patient Global: --; Provider Global: -- Swollen: --; Tender: -- Joint Exam 04/24/2023   No joint exam has been documented  for this visit   There is currently no information documented on the homunculus. Go to the Rheumatology activity and complete the homunculus joint exam.  Investigation: No additional findings.  Imaging: No results found.  Recent Labs: Lab Results  Component Value Date   WBC 4.9 06/27/2022   HGB 13.0 06/27/2022   PLT 283 06/27/2022   NA 142 08/29/2022   K 4.2 08/29/2022   CL 106 08/29/2022   CO2 22 08/29/2022   GLUCOSE 86 08/29/2022   BUN 14 08/29/2022   CREATININE 0.69 08/29/2022   BILITOT 0.3 08/29/2022   ALKPHOS 169 (H) 08/29/2022   AST 17 08/29/2022   ALT 13 08/29/2022   PROT 6.4 08/29/2022   ALBUMIN 4.3 08/29/2022   CALCIUM 9.1 08/29/2022   GFRAA 110 05/16/2019    Speciality Comments: No specialty comments available.  Procedures:  No procedures performed Allergies: Z-pak [azithromycin], Amoxicillin, Augmentin [amoxicillin-pot clavulanate], and Sulfa antibiotics   Assessment / Plan:     Visit Diagnoses: No diagnosis found.  Orders: No orders of the defined types were placed in this encounter.  No orders of the defined types were placed in this encounter.   Face-to-face time spent with patient was *** minutes. Greater than 50% of time was spent in counseling and coordination of care.  Follow-Up Instructions: No follow-ups on file.   Ellen Henri, CMA  Note - This record has been created using Animal nutritionist.  Chart creation errors have been sought, but may not always  have been located. Such creation errors do not reflect on  the standard of medical care.

## 2023-04-24 ENCOUNTER — Ambulatory Visit: Payer: 59 | Admitting: Rheumatology

## 2023-04-24 DIAGNOSIS — M19041 Primary osteoarthritis, right hand: Secondary | ICD-10-CM

## 2023-04-24 DIAGNOSIS — M7918 Myalgia, other site: Secondary | ICD-10-CM

## 2023-04-24 DIAGNOSIS — M62838 Other muscle spasm: Secondary | ICD-10-CM

## 2023-04-24 DIAGNOSIS — M19071 Primary osteoarthritis, right ankle and foot: Secondary | ICD-10-CM

## 2023-04-24 DIAGNOSIS — Z5181 Encounter for therapeutic drug level monitoring: Secondary | ICD-10-CM

## 2023-04-24 DIAGNOSIS — Z1589 Genetic susceptibility to other disease: Secondary | ICD-10-CM

## 2023-04-24 DIAGNOSIS — M722 Plantar fascial fibromatosis: Secondary | ICD-10-CM

## 2023-04-24 DIAGNOSIS — Z8669 Personal history of other diseases of the nervous system and sense organs: Secondary | ICD-10-CM

## 2023-04-24 DIAGNOSIS — M7061 Trochanteric bursitis, right hip: Secondary | ICD-10-CM

## 2023-04-24 DIAGNOSIS — R748 Abnormal levels of other serum enzymes: Secondary | ICD-10-CM

## 2023-04-24 DIAGNOSIS — E559 Vitamin D deficiency, unspecified: Secondary | ICD-10-CM

## 2023-04-24 DIAGNOSIS — E213 Hyperparathyroidism, unspecified: Secondary | ICD-10-CM

## 2023-04-25 ENCOUNTER — Other Ambulatory Visit: Payer: Self-pay | Admitting: Rheumatology

## 2023-04-25 NOTE — Telephone Encounter (Signed)
Last Fill: 02/12/2023   UDS:10/11/2022 Patient takes tramadol sparingly.  UDS negative for any other substances.    Narc Agreement: 01/03/2022   Next Visit: 04/19/2023   Last Visit: 10/11/2022   Dx: Myofascial pain syndrome    Current Dose per office note on 10/11/2022: not discussed     Okay to refill Tramadol?

## 2023-04-27 ENCOUNTER — Other Ambulatory Visit: Payer: Self-pay | Admitting: Family Medicine

## 2023-04-27 DIAGNOSIS — Z1231 Encounter for screening mammogram for malignant neoplasm of breast: Secondary | ICD-10-CM

## 2023-05-07 ENCOUNTER — Encounter: Payer: Self-pay | Admitting: Cardiovascular Disease

## 2023-05-08 NOTE — Telephone Encounter (Signed)
Called and spoke with patient stated that her palpitations comes and goes. She deny and swelling or weight can. She reported when she has these episode she does have some dizziness. She also takes her Nadolol which helps to bring her heart rate down. No more episodes since Sunday. Advised pt we are still waiting for a reply from the provider.

## 2023-05-09 NOTE — Progress Notes (Signed)
Office Visit Note  Patient: Brianna [name]             Date of Birth: 1966-07-07           MRN: [id number]             PCP: Shon Hale, MD Referring: Shon Hale, * Visit Date: 05/22/2023 Occupation: @GUAROCC @  Subjective:  Pain in joints and muscles  History of Present Illness: Brianna [name] is a 57 y.o. female with myofascial pain and osteoarthritis.  She states she continues to have pain and discomfort over bilateral trochanteric region.  She has difficulty sleeping on the side.  She also have some spasm in the trapezius region.  She has mild discomfort in her left heel.  She states she has modified her shoes which has been helpful.  She takes tramadol 1 or 2 tablets twice a week for increased pain.  She has been trying to do some stretching exercises.  She denies any history of joint swelling.    Activities of Daily Living:  Patient reports morning stiffness for 45-60 minutes.   Patient Reports nocturnal pain.  Difficulty dressing/grooming: Denies Difficulty climbing stairs: Denies Difficulty getting out of chair: Denies Difficulty using hands for taps, buttons, cutlery, and/or writing: Denies  Review of Systems  Constitutional:  Positive for fatigue.  HENT:  Negative for mouth sores and mouth dryness.        Dry lips  Eyes:  Negative for dryness.  Respiratory:  Negative for cough, shortness of breath and wheezing.   Cardiovascular:  Negative for chest pain.  Gastrointestinal:  Negative for blood in stool, constipation and diarrhea.  Endocrine: Negative for increased urination.  Genitourinary:  Negative for involuntary urination.  Musculoskeletal:  Positive for joint pain, joint pain, myalgias, morning stiffness, muscle tenderness and myalgias. Negative for gait problem, joint swelling and muscle weakness.  Skin:  Negative for color change, rash, hair loss and sensitivity to sunlight.  Allergic/Immunologic: Negative for susceptible to infections.   Neurological:  Positive for headaches. Negative for dizziness.  Hematological:  Negative for swollen glands.  Psychiatric/Behavioral:  Positive for sleep disturbance. Negative for depressed mood. The patient is not nervous/anxious.     PMFS History:  Patient Active Problem List   Diagnosis Date Noted   Elevated alkaline phosphatase level 09/01/2021   Cracked lips 07/04/2020   Deviated septum 04/08/2019   Chronic nasal congestion 04/08/2019   Hypertrophy of both inferior nasal turbinates 04/08/2019   Myofascial pain syndrome 08/17/2016   HLA B27 (HLA B27 positive) 08/17/2016   Trochanteric bursitis of both hips 08/17/2016   History of migraine 08/17/2016   Primary osteoarthritis of both hands 08/17/2016   Primary osteoarthritis of both feet 08/17/2016   Plantar fasciitis 08/17/2016   Hyperparathyroidism (HCC) 09/20/2015   High risk HPV infection 05/19/2013    Past Medical History:  Diagnosis Date   Arthritis    Bursitis of hip    bilateral   Chronic headaches    Deviated nasal septum    Factor V Leiden [facility])    mother with h/o Factor V Leiden   Fibromyalgia    Gall bladder polyp    x3   GERD (gastroesophageal reflux disease)    HPV test positive 03/2012   neg Pap, +16/18 HPV   Mitral valve prolapse    Molluscum contagiosum    Myofascial pain 2013   Dr. Corliss Skains   Obesity    Osteopenia    Plantar fasciitis  Seizures (HCC)    Tendonitis of elbow, right     Family History  Problem Relation Age of Onset   COPD Mother    Rheum arthritis Mother    Hyperlipidemia Mother    Heart attack Mother        01/2016   Colon polyps Mother    Factor V Leiden deficiency Mother    Diabetes Father    Hypertension Father    Hyperlipidemia Father    Parkinson's disease Father    Colon polyps Sister 50       pre-cancerous polyps-had surgery   Heart disease Sister    Graves' disease Sister    Hashimoto's thyroiditis Sister    Factor V Leiden deficiency Sister    Breast  cancer Maternal Aunt    Stomach cancer Paternal Aunt    Factor V Leiden deficiency Son    Hyperparathyroidism Neg Hx    Past Surgical History:  Procedure Laterality Date   CESAREAN SECTION  1997, 2000   CHOLECYSTECTOMY N/A 07/03/2022   Procedure: LAPAROSCOPIC CHOLECYSTECTOMY;  Surgeon: Berna Bue, MD;  Location: [facility];  Service: General;  Laterality: N/A;   TEMPOROMANDIBULAR JOINT SURGERY Left 1988   WISDOM TOOTH EXTRACTION     Social History   Social History Narrative   Patient is right-handed. She lives with her husband in a 2 level home. She drinks tea 1-2 x a day. And an occasional soda. She walks most days for 30 minutes.   Immunization History  Administered Date(s) Administered   Influenza,inj,Quad PF,6+ Mos 04/22/2019, 06/26/2022   Influenza-Unspecified 05/18/2020, 05/20/2021   Moderna Sars-Covid-2 Vaccination 07/29/2019, 08/26/2019, 06/29/2020   Pfizer Covid-19 Vaccine Bivalent Booster 52yrs & up 04/07/2021   Tdap 07/30/2015   Zoster Recombinant(Shingrix) 08/24/2018, 06/20/2021     Objective: Vital Signs: BP 109/74 (BP Location: Right Arm, Patient Position: Sitting, Cuff Size: Large)   Pulse 62   Resp 14   Ht 5\' 9"  (1.753 m)   Wt 217 lb (98.4 kg)   LMP 12/22/2012   BMI 32.05 kg/m    Physical Exam Vitals and nursing note reviewed.  Constitutional:      Appearance: She is well-developed.  HENT:     Head: Normocephalic and atraumatic.  Eyes:     Conjunctiva/sclera: Conjunctivae normal.  Cardiovascular:     Rate and Rhythm: Normal rate and regular rhythm.     Heart sounds: Normal heart sounds.  Pulmonary:     Effort: Pulmonary effort is normal.     Breath sounds: Normal breath sounds.  Abdominal:     General: Bowel sounds are normal.     Palpations: Abdomen is soft.  Musculoskeletal:     Cervical back: Normal range of motion.  Lymphadenopathy:     Cervical: No cervical adenopathy.  Skin:    General: Skin is warm and dry.     Capillary Refill:  Capillary refill takes less than 2 seconds.  Neurological:     Mental Status: She is alert and oriented to person, place, and time.  Psychiatric:        Behavior: Behavior normal.      Musculoskeletal Exam: Cervical, thoracic and lumbar spine were in good range of motion.  Shoulder joints, elbow joints, wrist joints, MCPs PIPs and DIPs with good range of motion with no synovitis.  Hip joints and knee joints with good range of motion.  She had bilateral trochanteric bursa tenderness.  There was no tenderness over ankles or MTPs.  CDAI Exam: CDAI Score: --  Patient Global: --; Provider Global: -- Swollen: --; Tender: -- Joint Exam 05/22/2023   No joint exam has been documented for this visit   There is currently no information documented on the homunculus. Go to the Rheumatology activity and complete the homunculus joint exam.  Investigation: No additional findings.  Imaging: No results found.  Recent Labs: Lab Results  Component Value Date   WBC 4.9 06/27/2022   HGB 13.0 06/27/2022   PLT 283 06/27/2022   NA 142 08/29/2022   K 4.2 08/29/2022   CL 106 08/29/2022   CO2 22 08/29/2022   GLUCOSE 86 08/29/2022   BUN 14 08/29/2022   CREATININE 0.69 08/29/2022   BILITOT 0.3 08/29/2022   ALKPHOS 169 (H) 08/29/2022   AST 17 08/29/2022   ALT 13 08/29/2022   PROT 6.4 08/29/2022   ALBUMIN 4.3 08/29/2022   CALCIUM 9.1 08/29/2022   GFRAA 110 05/16/2019    Speciality Comments: No specialty comments available.  Procedures:  No procedures performed Allergies: Z-pak [azithromycin], Amoxicillin, Augmentin [amoxicillin-pot clavulanate], and Sulfa antibiotics   Assessment / Plan:     Visit Diagnoses: Myofascial pain syndrome -she continues to have some generalized pain and discomfort and.  She has a spasm in the trapezius region.  She also had tenderness over bilateral trochanteric bursa.  She has been trying to do stretching exercises which are helpful.  She takes  tramadol 50mg  daily  as needed.  Patient states it is usually once or twice a week.  It gives her enough relief to function on a regular basis.  She takes methocarbamol on as needed basis as well.  Medication monitoring encounter - UDS: 10/11/2022 & narcotic agreement: 01/03/2022 - Plan: DRUG MONITOR, TRAMADOL,QN, URINE, DRUG MONITOR, PANEL 5, W/CONF, URINE  Trapezius muscle spasm-she had bilateral trapezius spasm on the examination today.  She uses methocarbamol as needed.  Primary osteoarthritis of both hands-she has some PIP and DIP prominence.  Joint protection muscle strengthening was advised.  Trochanteric bursitis of both hips - Injected both hips on 04/12/22-temporary relief.  Patient states the pain has recurred now.  She will continue to do stretching exercises.  I offered cortisone injection but she would like to hold off.  Patient will contact us if she needs cortisone injections.  Plantar fasciitis-she denies recurrence of recent pain.  She has intermittent mild symptoms.  Primary osteoarthritis of both feet-proper fitting shoes were advised.  Elevated alkaline phosphatase level - Chronically elevated alk phos.  Patient has undergone a thorough workup by gastroenterology.  HLA B27 (HLA B27 positive)  Hyperparathyroidism (HCC)  Vitamin D deficiency  History of migraine  Orders: Orders Placed This Encounter  Procedures   DRUG MONITOR, TRAMADOL,QN, URINE   DRUG MONITOR, PANEL 5, W/CONF, URINE   No orders of the defined types were placed in this encounter.   Follow-Up Instructions: Return in about 6 months (around 11/20/2023) for Osteoarthritis.   Pollyann Savoy, MD  Note - This record has been created using Animal nutritionist.  Chart creation errors have been sought, but may not always  have been located. Such creation errors do not reflect on  the standard of medical care.

## 2023-05-10 NOTE — Progress Notes (Signed)
Cardiology Clinic Note   Patient Name: Brianna [name] Date of Encounter: 05/11/2023  Primary Care Provider:  Shon Hale, MD Primary Cardiologist:  Brianna Fair, MD  Patient Profile    57 year old female with history of Factor V Leiden, without clinical thromboembolic events, palpitations, with other history to include fibromyalgia, obesity, and chronic headaches with seizures.  ZIO monitor revealed ectopic atrial tachycardia but no significant structural abnormalities were noted on echo.  The episodes were brief and she was placed on nadolol as needed only She has chronic chest wall spasms.  Past Medical History    Past Medical History:  Diagnosis Date   Arthritis    Bursitis of hip    bilateral   Chronic headaches    Deviated nasal septum    Factor V Leiden [facility])    mother with h/o Factor V Leiden   Fibromyalgia    Gall bladder polyp    x3   GERD (gastroesophageal reflux disease)    HPV test positive 03/2012   neg Pap, +16/18 HPV   Mitral valve prolapse    Molluscum contagiosum    Myofascial pain 2013   Dr. Corliss [name]   Obesity    Osteopenia    Plantar fasciitis    Seizures (HCC)    Tendonitis of elbow, right    Past Surgical History:  Procedure Laterality Date   CESAREAN SECTION  1997, 2000   CHOLECYSTECTOMY N/A 07/03/2022   Procedure: LAPAROSCOPIC CHOLECYSTECTOMY;  Surgeon: Brianna Bue, MD;  Location: [facility];  Service: General;  Laterality: N/A;   TEMPOROMANDIBULAR JOINT SURGERY Left 1988   WISDOM TOOTH EXTRACTION      Allergies  Allergies  Allergen Reactions   Z-Pak [Azithromycin]     GI upset   Amoxicillin Rash    Yeast Infection     Augmentin [Amoxicillin-Pot Clavulanate]     Yeast Infection    Sulfa Antibiotics Rash    History of Present Illness    Mrs. Brianna [name] comes today for ongoing assessment and management of rapid heart rhythm, PSVT.  Patient had an episode 5 days ago, while emptying the dishwasher and putting the  dishes in the covered her heart rate began to get very elevated.  Apple watch documented heart rate of 210 bpm.  The patient began to feel some chest pressure and having dyspnea.  She took 1 dose of nadolol 40 mg, and within 30 to 45 minutes heart rate had reduced to 100 and then into the 80s.  She states this occurs maybe once every couple of months.  It is not a daily issue or a weekly issue at this time.  She states that she is reducing her caffeine intake to avoid rapid heart rhythm, currently drinks iced tea which is caffeinated.  Home Medications    Current Outpatient Medications  Medication Sig Dispense Refill   acetaminophen (TYLENOL) 500 MG tablet Take 1,000 mg by mouth every 8 (eight) hours as needed for moderate pain.     azelastine (ASTELIN) 0.1 % nasal spray Place 1 spray into both nostrils 2 (two) times daily.     fluticasone (FLONASE) 50 MCG/ACT nasal spray Place 1 spray into both nostrils daily.     ibuprofen (ADVIL) 200 MG tablet Take 400 mg by mouth every 8 (eight) hours as needed (pain).     levocetirizine (XYZAL) 5 MG tablet Take 5 mg by mouth every evening.     Magnesium 500 MG CAPS Take 500 mg by mouth daily.  methocarbamol (ROBAXIN) 500 MG tablet TAKE 1 TABLET BY MOUTH AT 7 AM AND AT 2 PM AS NEEDED 60 tablet 3   Misc Natural Products (TART CHERRY ADVANCED PO) Take 1 capsule by mouth daily.     montelukast (SINGULAIR) 10 MG tablet Take 10 mg by mouth daily.     Multiple Vitamins-Minerals (MULTIVITAMIN WITH MINERALS) tablet Take 1 tablet by mouth daily.     nadolol (CORGARD) 40 MG tablet Take 1 tablet (40 mg total) by mouth daily as needed (Heart palpitations). 30 tablet 3   omeprazole (PRILOSEC) 20 MG capsule Take 20 mg by mouth daily as needed (acid reflux).     Probiotic Product (PROBIOTIC DAILY PO) Take 1 capsule by mouth daily.     traMADol (ULTRAM) 50 MG tablet Take 1 tablet (50 mg total) by mouth daily as needed. 30 tablet 0   vitamin B-12 (CYANOCOBALAMIN) 500 MCG  tablet Take 500 mcg by mouth daily.     Vitamin D, Ergocalciferol, (DRISDOL) 1.25 MG (50000 UNIT) CAPS capsule Take 1 capsule (50,000 Units total) by mouth every 14 (fourteen) days. 6 capsule 4   Coenzyme Q10 (CO Q 10) 100 MG CAPS Take 100 mg by mouth daily. (Patient not taking: Reported on 05/11/2023)     triamcinolone cream (KENALOG) 0.1 % Apply 1 Application topically 2 (two) times daily as needed. (Patient not taking: Reported on 05/11/2023)     No current facility-administered medications for this visit.     Family History    Family History  Problem Relation Age of Onset   COPD Mother    Rheum arthritis Mother    Hyperlipidemia Mother    Heart attack Mother        01/2016   Colon polyps Mother    Factor V Leiden deficiency Mother    Diabetes Father    Hypertension Father    Hyperlipidemia Father    Parkinson's disease Father    Colon polyps Sister 60       pre-cancerous polyps-had surgery   Heart disease Sister    Graves' disease Sister    Hashimoto's thyroiditis Sister    Factor V Leiden deficiency Sister    Breast cancer Maternal Aunt    Stomach cancer Paternal Aunt    Factor V Leiden deficiency Son    Hyperparathyroidism Neg Hx    She indicated that her mother is alive. She indicated that her father is alive. She indicated that her sister is alive. She indicated that her brother is alive. She indicated that both of her sons are alive. She indicated that her maternal aunt is deceased. She indicated that her paternal aunt is deceased. She indicated that the status of her neg hx is unknown.  Social History    Social History   Socioeconomic History   Marital status: Married    Spouse name: Brianna [name] Needle   Number of children: 2   Years of education: Not on file   Highest education level: Associate degree: academic program  Occupational History   Occupation: Accounting  Tobacco Use   Smoking status: Never    Passive exposure: Past   Smokeless tobacco: Never  Vaping Use    Vaping status: Never Used  Substance and Sexual Activity   Alcohol use: Yes    Comment: rarely   Drug use: No   Sexual activity: Yes    Partners: Male    Birth control/protection: Other-see comments, Post-menopausal    Comment: vasectomy  Other Topics Concern   Not on file  Social History Narrative   Patient is right-handed. She lives with her husband in a 2 level home. She drinks tea 1-2 x a day. And an occasional soda. She walks most days for 30 minutes.   Social Determinants of Health   Financial Resource Strain: Not on file  Food Insecurity: Not on file  Transportation Needs: Not on file  Physical Activity: Not on file  Stress: Not on file  Social Connections: Not on file  Intimate Partner Violence: Not on file     Review of Systems    General:  No chills, fever, night sweats or weight changes.  Cardiovascular:  No chest pain, dyspnea on exertion, edema, orthopnea, positive for palpitations, paroxysmal nocturnal dyspnea. Dermatological: No rash, lesions/masses Respiratory: No cough, dyspnea Urologic: No hematuria, dysuria Abdominal:   No nausea, vomiting, diarrhea, bright red blood per rectum, melena, or hematemesis Neurologic:  No visual changes, wkns, changes in mental status. All other systems reviewed and are otherwise negative except as noted above.  EKG Interpretation Date/Time:  Friday May 11 2023 08:33:06 EDT Ventricular Rate:  60 PR Interval:  176 QRS Duration:  62 QT Interval:  388 QTC Calculation: 388 R Axis:   -1  Text Interpretation: Normal sinus rhythm Normal ECG When compared with ECG of 06-Feb-2023 08:59, No significant change was found Confirmed by Joni Reining [phone]) on 05/11/2023 9:02:00 AM    Physical Exam    VS:  BP 112/76 (BP Location: Left Arm, Patient Position: Sitting, Cuff Size: Large)   Pulse 60   Ht 5\' 9"  (1.753 m)   Wt 218 lb 9.6 oz (99.2 kg)   LMP 12/22/2012   SpO2 96%   BMI 32.28 kg/m  , BMI Body mass index is  32.28 kg/m.     GEN: Well nourished, well developed, in no acute distress. HEENT: normal. Neck: Supple, no JVD, carotid bruits, or masses. Cardiac: RRR, soft systolic murmurs, rubs, or gallops. No clubbing, cyanosis, edema.  Radials/DP/PT 2+ and equal bilaterally.  Respiratory:  Respirations regular and unlabored, clear to auscultation bilaterally. GI: Soft, nontender, nondistended, BS + x 4. MS: no deformity or atrophy. Skin: warm and dry, no rash. Neuro:  Strength and sensation are intact. Psych: Normal affect.  EKG Interpretation Date/Time:  Friday May 11 2023 08:33:06 EDT Ventricular Rate:  60 PR Interval:  176 QRS Duration:  62 QT Interval:  388 QTC Calculation: 388 R Axis:   -1  Text Interpretation: Normal sinus rhythm Normal ECG When compared with ECG of 06-Feb-2023 08:59, No significant change was found Confirmed by Joni Reining [phone]) on 05/11/2023 9:02:00 AM   Lab Results  Component Value Date   WBC 4.9 06/27/2022   HGB 13.0 06/27/2022   HCT 39.9 06/27/2022   MCV 95.7 06/27/2022   PLT 283 06/27/2022   Lab Results  Component Value Date   CREATININE 0.69 08/29/2022   BUN 14 08/29/2022   NA 142 08/29/2022   K 4.2 08/29/2022   CL 106 08/29/2022   CO2 22 08/29/2022   Lab Results  Component Value Date   ALT 13 08/29/2022   AST 17 08/29/2022   GGT 20 07/12/2021   ALKPHOS 169 (H) 08/29/2022   BILITOT 0.3 08/29/2022   Lab Results  Component Value Date   CHOL 219 (H) 06/20/2021   HDL 68 06/20/2021   LDLCALC 139 (H) 06/20/2021   TRIG 69 06/20/2021   CHOLHDL 3.2 06/20/2021    Lab Results  Component Value Date   HGBA1C 5.2 06/20/2021  Review of Prior Studies EKG Interpretation Date/Time:  Friday May 11 2023 08:33:06 EDT Ventricular Rate:  60 PR Interval:  176 QRS Duration:  62 QT Interval:  388 QTC Calculation: 388 R Axis:   -1  Text Interpretation: Normal sinus rhythm Normal ECG When compared with ECG of 06-Feb-2023 08:59, No  significant change was found Confirmed by Joni Reining [phone]) on 05/11/2023 9:02:00 AM  EKG Interpretation Date/Time:                  Tuesday February 06 2023 08:59:36 EDT Ventricular Rate:         54 PR Interval:                 180 QRS Duration:             86 QT Interval:                 412 QTC Calculation:390 R Axis:                         22   Text Interpretation:Sinus bradycardia Confirmed by Joni Reining [phone]) on 02/06/2023 9:29:50 AM     Zio Monitor 06/20/2022   Predominant rhythm is normal sinus with normal circadian variation.   There is a single 15 beat episode of paroxysmal atrial tachycardia.   There is no atrial fibrilation, severe bradycardia or significant ventricular arrhythmia   Minor abnormalities on event monitor, limited to a single 15 beat episode of paroxysmal atrial tachycardia   Coronary CTA 11/24/2020 FINDINGS: Coronary arteries: Normal origins.   Coronary Calcium Score:   Left main: 0   Left anterior descending artery: 0   Left circumflex artery: 0   Right coronary artery: 0   Total: 0   Percentile: 0   Pericardium: Normal.   Ascending Aorta: Normal caliber.   Non-cardiac: See separate report from [facility].   IMPRESSION: Coronary calcium score of 0. This was 0 percentile for age-, race-, and sex-matched controls.  Assessment & Plan   1.  PSVT: Documentation for Apple watch and reviewed by Dr. Royann Shivers.  This is occurring on rare occasions,.  The patient reports every couple of months.  She is going to be more vigilant on watching her heart rate through her Apple Watch.  Continue on nadolol 40 mg as needed.  Recent labs revealed normal potassium and magnesium.  I have advised her if she is noticing rapid heart rhythm greater than 100 bpm sustained she will be referred to electrophysiology.  She is to report to [place] with these episodes and the need to take the nadolol each time.  Of also advised her that if she takes nadolol  and the heart rate does not come back to normal rate she is to be seen in the ED.  She verbalizes understanding.  She is going to eliminate caffeine from her diet.  2.  Hypercholesterolemia: Primary care provider is doing labs next month.  She is to continue low-cholesterol diet and increased exercise.  Currently not on statin therapy.         Signed, Bettey Mare. Liborio Nixon, ANP, AACC   05/11/2023 9:02 AM      Office [phone] Fax [phone]  Notice: This dictation was prepared with Dragon dictation along with smaller phrase technology. Any transcriptional errors that result from this process are unintentional and may not be corrected upon review.

## 2023-05-11 ENCOUNTER — Encounter: Payer: Self-pay | Admitting: Adult Health

## 2023-05-11 ENCOUNTER — Ambulatory Visit: Payer: 59 | Attending: Adult Health | Admitting: Adult Health

## 2023-05-11 VITALS — BP 112/76 | HR 60 | Ht 69.0 in | Wt 218.6 lb

## 2023-05-11 DIAGNOSIS — R002 Palpitations: Secondary | ICD-10-CM

## 2023-05-11 DIAGNOSIS — R079 Chest pain, unspecified: Secondary | ICD-10-CM | POA: Diagnosis not present

## 2023-05-11 NOTE — Patient Instructions (Signed)
Medication Instructions:  NO CHANGES    Lab Work: NONE     Testing/Procedures: NONE   Follow-Up: DR. Royann Shivers       Your next appointment:   3-4  MONTHS    Provider:   Thurmon Fair, MD     Other Instructions PLEASE GIVE [place] A CALL IF YOU HAVE RECURRENT FAST HEART RATE.

## 2023-05-22 ENCOUNTER — Encounter: Payer: Self-pay | Admitting: Rheumatology

## 2023-05-22 ENCOUNTER — Ambulatory Visit: Payer: 59 | Attending: Rheumatology | Admitting: Rheumatology

## 2023-05-22 VITALS — BP 109/74 | HR 62 | Resp 14 | Ht 69.0 in | Wt 217.0 lb

## 2023-05-22 DIAGNOSIS — E213 Hyperparathyroidism, unspecified: Secondary | ICD-10-CM

## 2023-05-22 DIAGNOSIS — M722 Plantar fascial fibromatosis: Secondary | ICD-10-CM

## 2023-05-22 DIAGNOSIS — E559 Vitamin D deficiency, unspecified: Secondary | ICD-10-CM

## 2023-05-22 DIAGNOSIS — M7061 Trochanteric bursitis, right hip: Secondary | ICD-10-CM

## 2023-05-22 DIAGNOSIS — M19041 Primary osteoarthritis, right hand: Secondary | ICD-10-CM

## 2023-05-22 DIAGNOSIS — M7062 Trochanteric bursitis, left hip: Secondary | ICD-10-CM

## 2023-05-22 DIAGNOSIS — Z8669 Personal history of other diseases of the nervous system and sense organs: Secondary | ICD-10-CM

## 2023-05-22 DIAGNOSIS — M19071 Primary osteoarthritis, right ankle and foot: Secondary | ICD-10-CM

## 2023-05-22 DIAGNOSIS — M19072 Primary osteoarthritis, left ankle and foot: Secondary | ICD-10-CM

## 2023-05-22 DIAGNOSIS — R748 Abnormal levels of other serum enzymes: Secondary | ICD-10-CM

## 2023-05-22 DIAGNOSIS — Z1589 Genetic susceptibility to other disease: Secondary | ICD-10-CM

## 2023-05-22 DIAGNOSIS — M7918 Myalgia, other site: Secondary | ICD-10-CM | POA: Diagnosis not present

## 2023-05-22 DIAGNOSIS — Z5181 Encounter for therapeutic drug level monitoring: Secondary | ICD-10-CM | POA: Diagnosis not present

## 2023-05-22 DIAGNOSIS — M19042 Primary osteoarthritis, left hand: Secondary | ICD-10-CM

## 2023-05-22 DIAGNOSIS — M62838 Other muscle spasm: Secondary | ICD-10-CM

## 2023-05-22 NOTE — Patient Instructions (Signed)
Iliotibial Band Syndrome Rehab Ask your health care provider which exercises are safe for you. Do exercises exactly as told by your provider and adjust them as told. It's normal to feel mild stretching, pulling, tightness, or discomfort as you do these exercises. Stop right away if you feel sudden pain or your pain gets a lot worse. Do not begin these exercises until told by your provider. Stretching and range-of-motion exercises These exercises warm up your muscles and joints. They also improve the movement and flexibility of your hip and pelvis. Quadriceps stretch, prone  Lie face down (prone) on a firm surface like a bed or padded floor. Bend your left / right knee. Reach back to hold your ankle or pant leg. If you can't reach your ankle or pant leg, use a belt looped around your foot and grab the belt instead. Gently pull your heel toward your butt. Your knee should not slide out to the side. You should feel a stretch in the front of your thigh and knee, also called the quadriceps. Hold this position for __________ seconds. Repeat __________ times. Complete this exercise __________ times a day. Iliotibial band stretch The iliotibial band is a strip of tissue that runs along the outside of your hip down to your knee. Lie on your side with your left / right leg on top. Bend both knees and grab your left / right ankle. Stretch out your bottom arm to help you balance. Slowly bring your top knee back so your thigh goes behind your back. Slowly lower your top leg toward the floor until you feel a gentle stretch on the outside of your left / right hip and thigh. If you don't feel a stretch and your knee won't go farther, place the heel of your other foot on top of your knee and pull your knee down toward the floor with your foot. Hold this position for __________ seconds. Repeat __________ times. Complete this exercise __________ times a day. Strengthening exercises These exercises build strength  and endurance in your hip and pelvis. Endurance means your muscles can keep working even when they're tired. Straight leg raises, side-lying This exercise strengthens the muscles that rotate the leg at the hip and move it away from your body. These muscles are called hip abductors. Lie on your side with your left / right leg on top. Lie so your head, shoulder, hip, and knee line up. You can bend your bottom knee to help you balance. Roll your hips slightly forward so they're stacked directly over each other. Your left / right knee should face forward. Tense the muscles in your outer thigh and hip. Lift your top leg 4-6 inches (10-15 cm) off the ground. Hold this position for __________ seconds. Slowly lower your leg back down to the starting position. Let your muscles fully relax before doing this exercise again. Repeat __________ times. Complete this exercise __________ times a day. Leg raises, prone This exercise strengthens the muscles that move the hips backward. These muscles are called hip extensors. Lie face down (prone) on your bed or a firm surface. You can put a pillow under your hips for comfort and to support your lower back. Bend your left / right knee so your foot points straight up toward the ceiling. Keep the other leg straight and behind you. Squeeze your butt muscles. Lift your left / right thigh off the firm surface. Do not let your back arch. Tense your thigh muscle as hard as you can without having  more knee pain. Hold this position for __________ seconds. Slowly lower your leg to the starting position. Allow your leg to relax all the way. Repeat __________ times. Complete this exercise __________ times a day. Hip hike  Stand sideways on a bottom step. Place your feet so that your left / right leg is on the step, and the other foot is hanging off the side. If you need support for balance, hold onto a railing or wall. Keep your knees straight and your abdomen square,  meaning your hips are level. Then, lift your left / right hip up toward the ceiling. Slowly let your leg that's hanging off the step lower towards the floor. Your foot should get closer to the ground. Do not lean or bend your knees during this movement. Repeat __________ times. Complete this exercise __________ times a day. This information is not intended to replace advice given to you by your health care provider. Make sure you discuss any questions you have with your health care provider. Document Revised: 09/22/2022 Document Reviewed: 09/22/2022 Elsevier Patient Education  2024 [employer].

## 2023-05-25 LAB — DRUG MONITOR, TRAMADOL,QN, URINE
Desmethyltramadol: 3974 ng/mL — ABNORMAL HIGH (ref <100)
Tramadol: 2439 ng/mL — ABNORMAL HIGH (ref <100)

## 2023-05-25 LAB — DRUG MONITOR, PANEL 5, W/CONF, URINE
Amphetamines: NEGATIVE ng/mL (ref <500)
Barbiturates: NEGATIVE ng/mL (ref <300)
Benzodiazepines: NEGATIVE ng/mL (ref <100)
Cocaine Metabolite: NEGATIVE ng/mL (ref <150)
Creatinine: 51.2 mg/dL (ref >=20.0)
Marijuana Metabolite: NEGATIVE ng/mL (ref <20)
Methadone Metabolite: NEGATIVE ng/mL (ref <100)
Opiates: NEGATIVE ng/mL (ref <100)
Oxidant: NEGATIVE ug/mL (ref <200)
Oxycodone: NEGATIVE ng/mL (ref <100)
pH: 5.4 (ref 4.5–9.0)

## 2023-05-25 LAB — DM TEMPLATE

## 2023-05-25 NOTE — Progress Notes (Signed)
UDS is consistent with tramadol use.

## 2023-06-01 ENCOUNTER — Ambulatory Visit
Admission: RE | Admit: 2023-06-01 | Discharge: 2023-06-01 | Disposition: A | Discharge status: Home / Self Care | Payer: 59 | Source: Ambulatory Visit | Attending: Family Medicine | Admitting: Family Medicine

## 2023-06-01 DIAGNOSIS — Z1231 Encounter for screening mammogram for malignant neoplasm of breast: Secondary | ICD-10-CM

## 2023-06-06 ENCOUNTER — Other Ambulatory Visit: Payer: Self-pay | Admitting: Family Medicine

## 2023-06-06 DIAGNOSIS — R928 Other abnormal and inconclusive findings on diagnostic imaging of breast: Secondary | ICD-10-CM

## 2023-06-07 ENCOUNTER — Encounter: Payer: Self-pay | Admitting: Rheumatology

## 2023-06-16 ENCOUNTER — Ambulatory Visit
Admission: RE | Admit: 2023-06-16 | Discharge: 2023-06-16 | Disposition: A | Discharge status: Home / Self Care | Payer: 59 | Source: Ambulatory Visit | Attending: Family Medicine | Admitting: Family Medicine

## 2023-06-16 DIAGNOSIS — R928 Other abnormal and inconclusive findings on diagnostic imaging of breast: Secondary | ICD-10-CM

## 2023-07-03 ENCOUNTER — Encounter: Payer: Self-pay | Admitting: Physician Assistant

## 2023-07-03 ENCOUNTER — Other Ambulatory Visit (INDEPENDENT_AMBULATORY_CARE_PROVIDER_SITE_OTHER): Payer: 59

## 2023-07-03 ENCOUNTER — Ambulatory Visit: Payer: 59 | Admitting: Physician Assistant

## 2023-07-03 DIAGNOSIS — M79672 Pain in left foot: Secondary | ICD-10-CM | POA: Diagnosis not present

## 2023-07-03 NOTE — Progress Notes (Signed)
Office Visit Note   Patient: Brianna [name]           Date of Birth: 1966/01/20           MRN: [id number] Visit Date: 07/03/2023              Requested by: Shon Hale, MD [address] [place],  [place] [address] PCP: Shon Hale, MD   Assessment & Plan: Visit Diagnoses: Left foot pain  Plan: Patient is a pleasant 57 year old woman who has a month history of left lateral foot pain.  She did states she stubbed her toe about a month ago.  She had significant pain and bruising at the base of the fifth toe.  The seem to get better but she noticed she has started having other foot pain on the lateral and mid part of her foot.  No plantar ecchymosis.  Findings consistent with just overuse from her fracture.  She does have a what appears to be a subacute fracture at the base of the fifth proximal phalanx.  Recommend stiff shoes and some orthotics.  Will follow-up in 1 month  Follow-Up Instructions: No follow-ups on file.   Orders:  No orders of the defined types were placed in this encounter.  No orders of the defined types were placed in this encounter.     Procedures: No procedures performed   Clinical Data: No additional findings.   Subjective: No chief complaint on file.   HPI pleasant 57 year old woman with a 1 month history of left lateral foot pain.  She said she stubbed her toe at the time and thought she broke it.  She knew there was not really much to be done so did not come in.  Still has more lateral foot pain less in her toe at this point denies any fever chills  Review of Systems  All other systems reviewed and are negative.    Objective: Vital Signs: LMP 12/22/2012   Physical Exam Constitutional:      Appearance: Normal appearance.  Pulmonary:     Effort: Pulmonary effort is normal.  Skin:    General: Skin is warm and dry.  Neurological:     General: No focal deficit present.     Mental Status: She is alert and oriented to  person, place, and time.  Psychiatric:        Mood and Affect: Mood normal.        Behavior: Behavior normal.     Ortho Exam Examination of left foot she is neurovascular intact mild soft tissue swelling but no erythema or induration.  Nontender over the fifth toe.  Mildly tender over the fifth metatarsal.  She has good plantarflexion dorsiflexion eversion inversion.  Mild soft tissue swelling without erythema or any concerns for infection in her ankle compartments are soft and compressible negative Denna Haggard' sign Specialty Comments:  No specialty comments available.  Imaging: No results found.   PMFS History: Patient Active Problem List   Diagnosis Date Noted   Elevated alkaline phosphatase level 09/01/2021   Cracked lips 07/04/2020   Deviated septum 04/08/2019   Chronic nasal congestion 04/08/2019   Hypertrophy of both inferior nasal turbinates 04/08/2019   Myofascial pain syndrome 08/17/2016   HLA B27 (HLA B27 positive) 08/17/2016   Trochanteric bursitis of both hips 08/17/2016   History of migraine 08/17/2016   Primary osteoarthritis of both hands 08/17/2016   Primary osteoarthritis of both feet 08/17/2016   Plantar fasciitis 08/17/2016  Hyperparathyroidism (HCC) 09/20/2015   High risk HPV infection 05/19/2013   Past Medical History:  Diagnosis Date   Arthritis    Bursitis of hip    bilateral   Chronic headaches    Deviated nasal septum    Factor V Leiden [facility])    mother with h/o Factor V Leiden   Fibromyalgia    Gall bladder polyp    x3   GERD (gastroesophageal reflux disease)    HPV test positive 03/2012   neg Pap, +16/18 HPV   Mitral valve prolapse    Molluscum contagiosum    Myofascial pain 2013   Dr. Corliss Skains   Obesity    Osteopenia    Plantar fasciitis    Seizures (HCC)    Tendonitis of elbow, right     Family History  Problem Relation Age of Onset   COPD Mother    Rheum arthritis Mother    Hyperlipidemia Mother    Heart attack Mother         01/2016   Colon polyps Mother    Factor V Leiden deficiency Mother    Diabetes Father    Hypertension Father    Hyperlipidemia Father    Parkinson's disease Father    Colon polyps Sister 36       pre-cancerous polyps-had surgery   Heart disease Sister    Graves' disease Sister    Hashimoto's thyroiditis Sister    Factor V Leiden deficiency Sister    Breast cancer Maternal Aunt    Stomach cancer Paternal Aunt    Factor V Leiden deficiency Son    Hyperparathyroidism Neg Hx     Past Surgical History:  Procedure Laterality Date   CESAREAN SECTION  1997, 2000   CHOLECYSTECTOMY N/A 07/03/2022   Procedure: LAPAROSCOPIC CHOLECYSTECTOMY;  Surgeon: Berna Bue, MD;  Location: [facility];  Service: General;  Laterality: N/A;   TEMPOROMANDIBULAR JOINT SURGERY Left 1988   WISDOM TOOTH EXTRACTION     Social History   Occupational History   Occupation: Accounting  Tobacco Use   Smoking status: Never    Passive exposure: Past   Smokeless tobacco: Never  Vaping Use   Vaping status: Never Used  Substance and Sexual Activity   Alcohol use: Yes    Comment: rarely   Drug use: No   Sexual activity: Yes    Partners: Male    Birth control/protection: Other-see comments, Post-menopausal    Comment: vasectomy

## 2023-07-06 ENCOUNTER — Other Ambulatory Visit: Payer: Self-pay | Admitting: Physician Assistant

## 2023-07-06 NOTE — Telephone Encounter (Signed)
Last Fill: 04/25/2023  UDS:05/22/2023  Narc Agreement: 05/22/2023  Next Visit: 11/20/2023  Last Visit: 05/22/2023  Dx: Myofascial pain syndrome   Current Dose per office note on 05/22/2023:tramadol 50mg  daily as needed    Okay to refill Tramadol?

## 2023-07-16 ENCOUNTER — Other Ambulatory Visit (HOSPITAL_COMMUNITY)
Admission: RE | Admit: 2023-07-16 | Discharge: 2023-07-16 | Disposition: A | Discharge status: Home / Self Care | Payer: 59 | Source: Ambulatory Visit | Attending: Obstetrics & Gynecology | Admitting: Obstetrics & Gynecology

## 2023-07-16 ENCOUNTER — Encounter (HOSPITAL_BASED_OUTPATIENT_CLINIC_OR_DEPARTMENT_OTHER): Payer: Self-pay | Admitting: Obstetrics & Gynecology

## 2023-07-16 ENCOUNTER — Ambulatory Visit (HOSPITAL_BASED_OUTPATIENT_CLINIC_OR_DEPARTMENT_OTHER)
Admission: RE | Admit: 2023-07-16 | Discharge: 2023-07-16 | Disposition: A | Discharge status: Home / Self Care | Payer: 59 | Source: Ambulatory Visit | Attending: Obstetrics & Gynecology | Admitting: Obstetrics & Gynecology

## 2023-07-16 ENCOUNTER — Ambulatory Visit (HOSPITAL_BASED_OUTPATIENT_CLINIC_OR_DEPARTMENT_OTHER): Payer: 59 | Admitting: Obstetrics & Gynecology

## 2023-07-16 VITALS — BP 119/60 | HR 62 | Ht 67.0 in | Wt 216.0 lb

## 2023-07-16 DIAGNOSIS — R748 Abnormal levels of other serum enzymes: Secondary | ICD-10-CM

## 2023-07-16 DIAGNOSIS — D6851 Activated protein C resistance: Secondary | ICD-10-CM | POA: Diagnosis present

## 2023-07-16 DIAGNOSIS — Z124 Encounter for screening for malignant neoplasm of cervix: Secondary | ICD-10-CM | POA: Insufficient documentation

## 2023-07-16 DIAGNOSIS — Z01419 Encounter for gynecological examination (general) (routine) without abnormal findings: Secondary | ICD-10-CM | POA: Diagnosis not present

## 2023-07-16 DIAGNOSIS — M7918 Myalgia, other site: Secondary | ICD-10-CM

## 2023-07-16 DIAGNOSIS — E559 Vitamin D deficiency, unspecified: Secondary | ICD-10-CM

## 2023-07-16 DIAGNOSIS — Z23 Encounter for immunization: Secondary | ICD-10-CM | POA: Diagnosis not present

## 2023-07-16 DIAGNOSIS — M79661 Pain in right lower leg: Secondary | ICD-10-CM

## 2023-07-16 MED ORDER — VITAMIN D (ERGOCALCIFEROL) 1.25 MG (50000 UNIT) PO CAPS: 50000.0000 [IU] | ORAL_CAPSULE | ORAL | 4 refills | Status: AC

## 2023-07-16 NOTE — Progress Notes (Signed)
57 y.o. [id number] Married White or Caucasian female here for annual exam.  Doing well.  Has myofascial pain thought related to fibromyalgia.  She did some PT but didn't think this really helped much.  Denies vaginal bleeding.    H/O alk phos elevation.  Has seen endocrinology and rheumatology.  Had GI work up that was negative in the past.  Will recheck today.  Has factor V Leiden.  Having some pain in right calf.  Multiple family members with hx of clotting.  This has been present a few weeks so I am doubtful but she is nervous about this.    Health Maintenance: Pap:  06/26/2022 Negative History of abnormal Pap:  yes.  Desires yearly.   MMG:  06/16/2023 Negative Colonoscopy:  09/24/2015.  Report states follow up 7 years.  She is going to call about this.  BMD:   09/13/2021 Normal Screening Labs: CMP ordered today.   reports that she has never smoked. She has been exposed to tobacco smoke. She has never used smokeless tobacco. She reports current alcohol use. She reports that she does not use drugs.  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 Corliss Skains   Obesity    Osteopenia    Plantar fasciitis    Seizures (HCC)    Tendonitis of elbow, right     Past Surgical History:  Procedure Laterality Date   CESAREAN SECTION  1997, 2000   CHOLECYSTECTOMY N/A 07/03/2022   Procedure: LAPAROSCOPIC CHOLECYSTECTOMY;  Surgeon: Berna Bue, MD;  Location: [facility];  Service: General;  Laterality: N/A;   TEMPOROMANDIBULAR JOINT SURGERY Left 1988   WISDOM TOOTH EXTRACTION      Current Outpatient Medications  Medication Sig  Dispense Refill   acetaminophen (TYLENOL) 500 MG tablet Take 1,000 mg by mouth every 8 (eight) hours as needed for moderate pain.     azelastine (ASTELIN) 0.1 % nasal spray Place 1 spray into both nostrils 2 (two) times daily.     ibuprofen (ADVIL) 200 MG tablet Take 400 mg by mouth every 8 (eight) hours as needed (pain).     levocetirizine (XYZAL) 5 MG tablet Take 5 mg by mouth every evening.     methocarbamol (ROBAXIN) 500 MG tablet TAKE 1 TABLET BY MOUTH AT 7 AM AND AT 2 PM AS NEEDED 60 tablet 3   Misc Natural Products (TART CHERRY ADVANCED PO) Take 1 capsule by mouth daily.     montelukast (SINGULAIR) 10 MG tablet Take 10 mg by mouth daily.     Multiple Vitamins-Minerals (MULTIVITAMIN WITH MINERALS) tablet Take 1 tablet by mouth daily.     nadolol (CORGARD) 40 MG tablet Take 1 tablet (40 mg total) by mouth daily as needed (Heart palpitations). 30 tablet 3   omeprazole (PRILOSEC) 20 MG capsule Take 20 mg by mouth daily as needed (acid reflux).     Probiotic Product (PROBIOTIC DAILY PO) Take 1 capsule by mouth daily.     traMADol (ULTRAM) 50 MG tablet  Take 1 tablet (50 mg total) by mouth daily as needed. 30 tablet 0   triamcinolone cream (KENALOG) 0.1 % Apply 1 Application topically 2 (two) times daily as needed.     vitamin B-12 (CYANOCOBALAMIN) 500 MCG tablet Take 500 mcg by mouth daily.     Coenzyme Q10 (CO Q 10) 100 MG CAPS Take 100 mg by mouth daily. (Patient not taking: Reported on 05/11/2023)     fluticasone (FLONASE) 50 MCG/ACT nasal spray Place 1 spray into both nostrils daily. (Patient not taking: Reported on 05/22/2023)     Magnesium 500 MG CAPS Take 500 mg by mouth daily. (Patient not taking: Reported on 05/22/2023)     Vitamin D, Ergocalciferol, (DRISDOL) 1.25 MG (50000 UNIT) CAPS capsule Take 1 capsule (50,000 Units total) by mouth every 14 (fourteen) days. 6 capsule 4   No current facility-administered medications for this visit.    Family History  Problem Relation Age of  Onset   COPD Mother    Rheum arthritis Mother    Hyperlipidemia Mother    Heart attack Mother        01/2016   Colon polyps Mother    Factor V Leiden deficiency Mother    Diabetes Father    Hypertension Father    Hyperlipidemia Father    Parkinson's disease Father    Colon polyps Sister 36       pre-cancerous polyps-had surgery   Heart disease Sister    Graves' disease Sister    Hashimoto's thyroiditis Sister    Factor V Leiden deficiency Sister    Breast cancer Maternal Aunt    Stomach cancer Paternal Aunt    Factor V Leiden deficiency Son    Hyperparathyroidism Neg Hx     ROS: Constitutional: negative Genitourinary:negative  Exam:   BP 119/60 (BP Location: Left Arm, Patient Position: Sitting, Cuff Size: Large)   Pulse 62   Ht 5\' 7"  (1.702 m)   Wt 216 lb (98 kg)   LMP 12/22/2012   BMI 33.83 kg/m   Height: 5\' 7"  (170.2 cm)  General appearance: alert, cooperative and appears stated age Head: Normocephalic, without obvious abnormality, atraumatic Neck: no adenopathy, supple, symmetrical, trachea midline and thyroid normal to inspection and palpation Lungs: clear to auscultation bilaterally Breasts: normal appearance, no masses or tenderness Heart: regular rate and rhythm Abdomen: soft, non-tender; bowel sounds normal; no masses,  no organomegaly Extremities: extremities normal, atraumatic, no cyanosis or edema Skin: Skin color, texture, turgor normal. No rashes or lesions Lymph nodes: Cervical, supraclavicular, and axillary nodes normal. No abnormal inguinal nodes palpated Neurologic: Grossly normal   Pelvic: External genitalia:  no lesions              Urethra:  normal appearing urethra with no masses, tenderness or lesions              Bartholins and Skenes: normal                 Vagina: normal appearing vagina with normal color and no discharge, no lesions              Cervix: no lesions              Pap taken: No. Bimanual Exam:  Uterus:  normal size,  contour, position, consistency, mobility, non-tender              Adnexa: normal adnexa and no mass, fullness, tenderness  Rectovaginal: Confirms               Anus:  normal sphincter tone, no lesions  Chaperone, Ina Homes, CMA, was present for exam.  Assessment/Plan: 1. Well woman exam with routine gynecological exam (Primary) - Pap smear obtained today.  Pt desires yearly pap smear - Mammogram 05/2023 - Colonoscopy 2017.  She will call Dr. Loreta Ave to check about follow up. - Bone mineral density 2023 - lab work ordered - vaccines reviewed/updated  2. Need for influenza vaccination - Flu vaccine trivalent PF, 6mos and older(Flulaval,Afluria,Fluarix,Fluzone)  3. Alkaline phosphatase elevation - Comprehensive metabolic panel  4. Cervical cancer screening - Cytology - PAP( [place])  5. Vitamin D deficiency - VITAMIN D 25 Hydroxy (Vit-D Deficiency, Fractures) - Vitamin D, Ergocalciferol, (DRISDOL) 1.25 MG (50000 UNIT) CAPS capsule; Take 1 capsule (50,000 Units total) by mouth every 14 (fourteen) days.  Dispense: 6 capsule; Refill: 4  6. Myofascial pain syndrome  7. Right calf pain  8. Factor V Leiden (HCC) - US Venous Img Lower Unilateral Right (DVT); Future.  Will be done at 1pm today and ordered with STAT

## 2023-07-17 LAB — COMPREHENSIVE METABOLIC PANEL
ALT: 16 [IU]/L (ref 0–32)
AST: 20 [IU]/L (ref 0–40)
Albumin: 4.3 g/dL (ref 3.8–4.9)
Alkaline Phosphatase: 193 [IU]/L — ABNORMAL HIGH (ref 44–121)
BUN/Creatinine Ratio: 21 (ref 9–23)
BUN: 17 mg/dL (ref 6–24)
Bilirubin Total: 0.7 mg/dL (ref 0.0–1.2)
CO2: 23 mmol/L (ref 20–29)
Calcium: 9 mg/dL (ref 8.7–10.2)
Chloride: 105 mmol/L (ref 96–106)
Creatinine, Ser: 0.8 mg/dL (ref 0.57–1.00)
Globulin, Total: 2.3 g/dL (ref 1.5–4.5)
Glucose: 86 mg/dL (ref 70–99)
Potassium: 3.7 mmol/L (ref 3.5–5.2)
Sodium: 141 mmol/L (ref 134–144)
Total Protein: 6.6 g/dL (ref 6.0–8.5)
eGFR: 85 mL/min/{1.73_m2} (ref >59)

## 2023-07-17 LAB — VITAMIN D 25 HYDROXY (VIT D DEFICIENCY, FRACTURES): Vit D, 25-Hydroxy: 45.2 ng/mL (ref 30.0–100.0)

## 2023-07-19 LAB — CYTOLOGY - PAP
Adequacy: ABSENT
Comment: NEGATIVE
Diagnosis: NEGATIVE
High risk HPV: NEGATIVE

## 2023-07-27 ENCOUNTER — Telehealth: Payer: Self-pay | Admitting: Family Medicine

## 2023-07-27 DIAGNOSIS — B9689 Other specified bacterial agents as the cause of diseases classified elsewhere: Secondary | ICD-10-CM

## 2023-07-27 DIAGNOSIS — J019 Acute sinusitis, unspecified: Secondary | ICD-10-CM

## 2023-07-27 MED ORDER — DOXYCYCLINE HYCLATE 100 MG PO TABS: 100.0000 mg | ORAL_TABLET | Freq: Two times a day (BID) | ORAL | 0 refills | Status: AC

## 2023-07-27 NOTE — Progress Notes (Signed)
 E-Visit for Sinus Problems  We are sorry that you are not feeling well.  Here is how we plan to help!  Based on what you have shared with me it looks like you have sinusitis.  Sinusitis is inflammation and infection in the sinus cavities of the head.  Based on your presentation I believe you most likely have Acute Bacterial Sinusitis.  This is an infection caused by bacteria and is treated with antibiotics. I have prescribed Doxycyline 100 mg twice daily for 7 days You may use an oral decongestant such as Mucinex  D or if you have glaucoma or high blood pressure use plain Mucinex . Saline nasal spray help and can safely be used as often as needed for congestion.  If you develop worsening sinus pain, fever or notice severe headache and vision changes, or if symptoms are not better after completion of antibiotic, please schedule an appointment with a health care provider.    Sinus infections are not as easily transmitted as other respiratory infection, however we still recommend that you avoid close contact with loved ones, especially the very young and elderly.  Remember to wash your hands thoroughly throughout the day as this is the number one way to prevent the spread of infection!  Home Care: Only take medications as instructed by your medical team. Complete the entire course of an antibiotic. Do not take these medications with alcohol. A steam or ultrasonic humidifier can help congestion.  You can place a towel over your head and breathe in the steam from hot water coming from a faucet. Avoid close contacts especially the very young and the elderly. Cover your mouth when you cough or sneeze. Always remember to wash your hands.  Get Help Right Away If: You develop worsening fever or sinus pain. You develop a severe head ache or visual changes. Your symptoms persist after you have completed your treatment plan.  Make sure you Understand these instructions. Will watch your condition. Will get  help right away if you are not doing well or get worse.  Thank you for choosing an e-visit.  Your e-visit answers were reviewed by a board certified advanced clinical practitioner to complete your personal care plan. Depending upon the condition, your plan could have included both over the counter or prescription medications.  Please review your pharmacy choice. Make sure the pharmacy is open so you can pick up prescription now. If there is a problem, you may contact your provider through [employer] and have the prescription routed to another pharmacy.  Your safety is important to us . If you have drug allergies check your prescription carefully.   For the next 24 hours you can use MyChart to ask questions about today's visit, request a non-urgent call back, or ask for a work or school excuse. You will get an email in the next two days asking about your experience. I hope that your e-visit has been valuable and will speed your recovery.  I provided 5 minutes of non face-to-face time during this encounter for chart review, medication and order placement, as well as and documentation.

## 2023-07-31 ENCOUNTER — Ambulatory Visit: Payer: 59 | Admitting: Physician Assistant

## 2023-08-07 ENCOUNTER — Telehealth (HOSPITAL_BASED_OUTPATIENT_CLINIC_OR_DEPARTMENT_OTHER): Payer: Self-pay | Admitting: *Deleted

## 2023-08-07 NOTE — Telephone Encounter (Signed)
-----   Message from Ronal GORMAN Pinal sent at 08/06/2023  7:05 AM EST ----- Regarding: message from Dr. Luella Cramp, I saw this pt on 12/23.  She has mildly elevated alk phos.  She has seen Dr. Sam in the past.  I sent her a message and she said unless there is a significant change in her hx, she does not need anything else for this evaluation from her standpoint.  I've also reached out to Dr. Legrand, GI, who supervised the PA she saw in 2023.  Would you make a phone note about this?  Thanks.  Elvie

## 2023-08-07 NOTE — Telephone Encounter (Signed)
 Called pt to let her know that Dr. Cleotilde has reached out to Dr. Legrand (GI) and Dr. Sam, who she has seen in the past, regarding the elevated alk phos labs. She advised that unless there is a significant change in pts history, no additional evaluation is needed from her standpoint. Pt verbalized understanding.

## 2023-08-27 ENCOUNTER — Encounter: Payer: Self-pay | Admitting: Cardiovascular Disease

## 2023-08-27 NOTE — Telephone Encounter (Signed)
 I can see her

## 2023-09-11 ENCOUNTER — Ambulatory Visit (INDEPENDENT_AMBULATORY_CARE_PROVIDER_SITE_OTHER): Payer: BC Managed Care – PPO

## 2023-09-11 ENCOUNTER — Ambulatory Visit: Payer: BC Managed Care – PPO | Admitting: Podiatry

## 2023-09-11 DIAGNOSIS — S92912A Unspecified fracture of left toe(s), initial encounter for closed fracture: Secondary | ICD-10-CM | POA: Diagnosis not present

## 2023-09-11 DIAGNOSIS — M7752 Other enthesopathy of left foot: Secondary | ICD-10-CM | POA: Diagnosis not present

## 2023-09-11 DIAGNOSIS — M79672 Pain in left foot: Secondary | ICD-10-CM

## 2023-09-11 DIAGNOSIS — M722 Plantar fascial fibromatosis: Secondary | ICD-10-CM

## 2023-09-11 MED ORDER — TRIAMCINOLONE ACETONIDE 10 MG/ML IJ SUSP
10.0000 mg | Freq: Once | INTRAMUSCULAR | Status: AC
  Administered 2023-09-11: 10 mg

## 2023-09-11 MED ORDER — MELOXICAM 7.5 MG PO TABS
7.5000 mg | ORAL_TABLET | Freq: Every day | ORAL | 0 refills | Status: DC
Start: 2023-09-11 — End: 2023-10-08

## 2023-09-11 NOTE — Progress Notes (Signed)
Chief Complaint  Patient presents with   Toe Pain    Pt presents for toe pain 5th toe on the left pt stated that she hit the toe back in October went to an orthopedics and they told her she had a fracture.   HPI: 59 y.o. female presenting today with c/o pain in the bottom of the left heel.  Pain is rated as 8/10.  Patient fractured her left fifth toe within the past few months thinks that she may have been walking to avoid the painful area is having pain along the base of the fifth metatarsal as well as the plantar heel.  She states that she had plantar fasciitis in the past and it does feel similar.  She does stretch on a daily basis.  She did see orthopedics for this who dispensed power step inserts but she states that these were actually more uncomfortable so she does not wear them.  She does feel better with a shoe that has an elevated heel.  As she notes pain with for steps out of bed in the morning and after prolonged sitting.  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 Corliss Skains   Obesity    Osteopenia    Plantar fasciitis    Seizures (HCC)    Tendonitis of elbow, right     Past Surgical History:  Procedure Laterality Date   CESAREAN SECTION  1997, 2000   CHOLECYSTECTOMY N/A 07/03/2022   Procedure: LAPAROSCOPIC CHOLECYSTECTOMY;  Surgeon: Berna Bue, MD;  Location: [facility];  Service: General;  Laterality: N/A;   TEMPOROMANDIBULAR JOINT SURGERY Left 1988   WISDOM TOOTH EXTRACTION      Allergies  Allergen Reactions   Z-Pak [Azithromycin]     GI upset   Amoxicillin Rash    Yeast Infection      Augmentin [Amoxicillin-Pot Clavulanate]     Yeast Infection    Sulfa Antibiotics Rash     Physical Exam: General: The patient is alert and oriented x3 in no acute distress.  Dermatology:  No ecchymosis, erythema, or edema bilateral.  No open lesions.    Vascular: Palpable pedal pulses bilaterally. Capillary refill within normal limits.  No appreciable edema.    Neurological: Light touch sensation intact bilateral.  MMT 5/5 to lower extremity bilateral. Negative Tinel's sign with percussion of the posterior tibial nerve on the affected extremity.    Musculoskeletal Exam:  There is pain on palpation of the plantarmedial & plantarcentral aspect of left heel.  No gaps or nodules within the plantar fascia.  Positive Windlass mechanism bilateral.  Antalgic gait noted with first few steps upon standing.  No pain on palpation of achilles tendon bilateral.  Ankle df is 10 degrees with knee extended b/l.  No pain on palpation of the left fifth MPJ or with range of motion at the MPJ.  Radiographic Exam (left foot, 3 weightbearing views, 09/11/2023):  Normal osseous mineralization.  There is a intra-articular fracture at  the fifth MPJ.  The fracture is in the base of the proximal phalanx.  This is in good position.  No other fracture is noted along the fifth metatarsal or calcaneus where she is also having discomfort.  Assessment/Plan of Care: 1. Left foot pain   2. Plantar fasciitis, left   3. Fracture of proximal phalanx of toe of left foot     Meds ordered this encounter  Medications   meloxicam (MOBIC) 7.5 MG tablet    Sig: Take 1 tablet (7.5 mg total) by mouth daily.    Dispense:  30 tablet    Refill:  0   triamcinolone acetonide (KENALOG) 10 MG/ML injection 10 mg   FOR HOME USE ONLY DME NIGHT SPLINT  -Reviewed etiology of plantar fasciitis with patient.  Discussed treatment options with patient today, including cortisone injection, NSAID course of treatment, stretching exercises, physical  therapy, use of night splint, rest, icing the heel, arch supports/orthotics, and supportive shoe gear.    With the patient's verbal consent, a corticosteroid injection was administered to the left heel, consisting of a mixture of 1% lidocaine plain, 0.5% Sensorcaine plain, and Kenalog-10 for a total of 1.5cc administered.  A Band-aid was applied. Pain level post-injection is 4/10.  Patient was fitted for a night splint to wear this is a static AFO with soft interface material to be worn when non-weight bearing or when sleeping  She was shown in our power step inserts but states this is with the orthopedic specialist gave her and they are not comfortable.  We did briefly discuss custom orthotics but will hold off at this time due to anticipated cost.  She was given stretching exercises that she can also perform to help prevent tightening of the muscles in the area.    Prescription for meloxicam 7.5 mg 1 tab p.o. daily sent to her pharmacy on file.  Return in about 4 weeks (around 10/09/2023) for f/u plantar fasciitis.   Clerance Lav, DPM, FACFAS [facility]     [address] [place], [place] [address]                Office [phone]  Fax [phone]

## 2023-09-11 NOTE — Patient Instructions (Signed)

## 2023-10-07 ENCOUNTER — Other Ambulatory Visit: Payer: Self-pay | Admitting: Podiatry

## 2023-10-09 ENCOUNTER — Ambulatory Visit: Payer: BC Managed Care – PPO | Admitting: Podiatry

## 2023-10-09 ENCOUNTER — Encounter: Payer: Self-pay | Admitting: Podiatry

## 2023-10-09 DIAGNOSIS — M722 Plantar fascial fibromatosis: Secondary | ICD-10-CM

## 2023-10-09 MED ORDER — METHYLPREDNISOLONE 4 MG PO TBPK: ORAL_TABLET | ORAL | 0 refills | Status: DC

## 2023-10-09 NOTE — Progress Notes (Unsigned)
 Chief Complaint  Patient presents with   Plantar Fasciitis    Patient is here for PF F/U states foot feels the same   HPI: 58 y.o. female presenting today for follow-up of left plantar fasciitis.  Last visit she was given an injection, and fitted for a night splint.  She had already gotten power step inserts from orthopedics and states that these were too uncomfortable.  Stretching exercises have been dispensed.  States she feels "about the same" today.    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 Corliss Skains   Obesity    Osteopenia    Plantar fasciitis    Seizures (HCC)    Tendonitis of elbow, right     Past Surgical History:  Procedure Laterality Date   CESAREAN SECTION  1997, 2000   CHOLECYSTECTOMY N/A 07/03/2022   Procedure: LAPAROSCOPIC CHOLECYSTECTOMY;  Surgeon: Berna Bue, MD;  Location: [facility];  Service: General;  Laterality: N/A;   TEMPOROMANDIBULAR JOINT SURGERY Left 1988   WISDOM TOOTH EXTRACTION      Allergies  Allergen Reactions   Z-Pak [Azithromycin]     GI upset   Amoxicillin Rash    Yeast Infection     Augmentin [Amoxicillin-Pot Clavulanate]     Yeast Infection    Sulfa Antibiotics Rash     Physical Exam: General: The patient is alert and oriented x3 in no acute distress.  Dermatology:  No ecchymosis, erythema, or edema bilateral.  No open lesions.    Vascular: Palpable pedal pulses bilaterally. Capillary refill within normal limits.  No appreciable edema.    Neurological: Light touch sensation intact bilateral.  MMT 5/5 to lower extremity bilateral. Negative Tinel's sign with  percussion of the posterior tibial nerve on the affected extremity.    Musculoskeletal Exam:  There is pain on palpation of the plantarmedial & plantarcentral aspect of left heel.  No gaps or nodules within the plantar fascia.  Positive Windlass mechanism bilateral.  Antalgic gait noted with first few steps upon standing.  No pain on palpation of achilles tendon bilateral.  Ankle df less than 10 degrees with knee extended b/l.  Assessment/Plan of Care: 1. Plantar fasciitis, left     Meds ordered this encounter  Medications   methylPREDNISolone (MEDROL DOSEPAK) 4 MG TBPK tablet    Sig: Take as directed    Dispense:  21 tablet    Refill:  0   AMB REFERRAL TO PHYSICAL THERAPY  -Reviewed etiology of plantar fasciitis with patient.  Discussed treatment options with patient today, including cortisone injection, NSAID course of treatment, stretching exercises, physical therapy, use of night splint, rest, icing the heel, arch supports/orthotics, and supportive shoe gear.    Rx PT ordered today. Rx Medrol Dose pack for pain and inflammation. Continue with stretches and use of posterior night splint. Orthotic estimate form given, as custom orthotics are recommended for better, more customized foot support.  She can call to schedule with our pedorthist after speaking with her  insurance company to find out her coverage, if she'd like to proceed with them.    Return in about 4 weeks (around 11/06/2023) for f/u plantar fasciitis L.   Clerance Lav, DPM, FACFAS [facility]     [address] [place], [place] [address]                Office [phone]  Fax [phone]

## 2023-10-23 DIAGNOSIS — Z713 Dietary counseling and surveillance: Secondary | ICD-10-CM | POA: Diagnosis not present

## 2023-10-23 DIAGNOSIS — R748 Abnormal levels of other serum enzymes: Secondary | ICD-10-CM | POA: Diagnosis not present

## 2023-10-23 DIAGNOSIS — E7800 Pure hypercholesterolemia, unspecified: Secondary | ICD-10-CM | POA: Diagnosis not present

## 2023-10-31 ENCOUNTER — Other Ambulatory Visit: Payer: Self-pay | Admitting: Rheumatology

## 2023-10-31 NOTE — Telephone Encounter (Signed)
 Last Fill: 07/06/2023  UDS:05/22/2023 UDS is consistent with tramadol use.   Narc Agreement: 05/22/2023  Next Visit: 11/20/2023  Last Visit: 05/21/2024  Dx: Myofascial pain syndrome   Current Dose per office note on 05/22/2023: tramadol 50mg  daily as needed   Patient to update UDS and Narcotic agreement at upcoming appointment on 11/20/2023  Okay to refill Tramadol?

## 2023-11-06 ENCOUNTER — Ambulatory Visit (INDEPENDENT_AMBULATORY_CARE_PROVIDER_SITE_OTHER): Admitting: Podiatry

## 2023-11-06 ENCOUNTER — Encounter: Payer: Self-pay | Admitting: Podiatry

## 2023-11-06 DIAGNOSIS — M722 Plantar fascial fibromatosis: Secondary | ICD-10-CM | POA: Diagnosis not present

## 2023-11-06 NOTE — Progress Notes (Deleted)
 Office Visit Note  Patient: Brianna [name]             Date of Birth: 12/16/1965           MRN: [id number]             PCP: Ransom Byers, MD Referring: Ransom Byers, * Visit Date: 11/20/2023 Occupation: @GUAROCC @  Subjective:  No chief complaint on file.   History of Present Illness: Brianna [name] is a 58 y.o. female ***     Activities of Daily Living:  Patient reports morning stiffness for *** {minute/hour:19697}.   Patient {ACTIONS;DENIES/REPORTS:21021675::"Denies"} nocturnal pain.  Difficulty dressing/grooming: {ACTIONS;DENIES/REPORTS:21021675::"Denies"} Difficulty climbing stairs: {ACTIONS;DENIES/REPORTS:21021675::"Denies"} Difficulty getting out of chair: {ACTIONS;DENIES/REPORTS:21021675::"Denies"} Difficulty using hands for taps, buttons, cutlery, and/or writing: {ACTIONS;DENIES/REPORTS:21021675::"Denies"}  No Rheumatology ROS completed.   PMFS History:  Patient Active Problem List   Diagnosis Date Noted   Elevated alkaline phosphatase level 09/01/2021   Cracked lips 07/04/2020   Deviated septum 04/08/2019   Chronic nasal congestion 04/08/2019   Hypertrophy of both inferior nasal turbinates 04/08/2019   Myofascial pain syndrome 08/17/2016   HLA B27 (HLA B27 positive) 08/17/2016   Trochanteric bursitis of both hips 08/17/2016   History of migraine 08/17/2016   Primary osteoarthritis of both hands 08/17/2016   Primary osteoarthritis of both feet 08/17/2016   Plantar fasciitis 08/17/2016   Hyperparathyroidism (HCC) 09/20/2015   High risk HPV infection 05/19/2013    Past Medical History:  Diagnosis Date   Arthritis    Bursitis of hip    bilateral   Chronic headaches    Deviated nasal septum    Factor V Leiden [facility])    mother with h/o Factor V Leiden   Fibromyalgia    Gall bladder polyp    x3   GERD (gastroesophageal reflux disease)    HPV test positive 03/2012   neg Pap, +16/18 HPV   Mitral valve prolapse    Molluscum contagiosum     Myofascial pain 2013   Dr. Alvira Josephs   Obesity    Osteopenia    Plantar fasciitis    Seizures (HCC)    Tendonitis of elbow, right     Family History  Problem Relation Age of Onset   COPD Mother    Rheum arthritis Mother    Hyperlipidemia Mother    Heart attack Mother        01/2016   Colon polyps Mother    Factor V Leiden deficiency Mother    Diabetes Father    Hypertension Father    Hyperlipidemia Father    Parkinson's disease Father    Colon polyps Sister 47       pre-cancerous polyps-had surgery   Heart disease Sister    Graves' disease Sister    Hashimoto's thyroiditis Sister    Factor V Leiden deficiency Sister    Breast cancer Maternal Aunt    Stomach cancer Paternal Aunt    Factor V Leiden deficiency Son    Hyperparathyroidism Neg Hx    Past Surgical History:  Procedure Laterality Date   CESAREAN SECTION  1997, 2000   CHOLECYSTECTOMY N/A 07/03/2022   Procedure: LAPAROSCOPIC CHOLECYSTECTOMY;  Surgeon: Adalberto Acton, MD;  Location: [facility];  Service: General;  Laterality: N/A;   TEMPOROMANDIBULAR JOINT SURGERY Left 1988   WISDOM TOOTH EXTRACTION     Social History   Social History Narrative   ** Merged History Encounter **       Patient is right-handed. She lives with her  husband in a 2 level home. She drinks tea 1-2 x a day. And an occasional soda. She walks most days for 30 minutes.   Immunization History  Administered Date(s) Administered   Influenza, Seasonal, Injecte, Preservative Fre 07/16/2023   Influenza,inj,Quad PF,6+ Mos 04/22/2019, 06/26/2022   Influenza-Unspecified 05/18/2020, 05/20/2021   Moderna Sars-Covid-2 Vaccination 07/29/2019, 08/26/2019, 06/29/2020   Pfizer Covid-19 Vaccine Bivalent Booster 49yrs & up 04/07/2021   Tdap 07/30/2015   Zoster Recombinant(Shingrix) 08/24/2018, 06/20/2021     Objective: Vital Signs: LMP 12/22/2012    Physical Exam   Musculoskeletal Exam: ***  CDAI Exam: CDAI Score: -- Patient Global: --;  Provider Global: -- Swollen: --; Tender: -- Joint Exam 11/20/2023   No joint exam has been documented for this visit   There is currently no information documented on the homunculus. Go to the Rheumatology activity and complete the homunculus joint exam.  Investigation: No additional findings.  Imaging: No results found.  Recent Labs: Lab Results  Component Value Date   WBC 4.9 06/27/2022   HGB 13.0 06/27/2022   PLT 283 06/27/2022   NA 141 07/16/2023   K 3.7 07/16/2023   CL 105 07/16/2023   CO2 23 07/16/2023   GLUCOSE 86 07/16/2023   BUN 17 07/16/2023   CREATININE 0.80 07/16/2023   BILITOT 0.7 07/16/2023   ALKPHOS 193 (H) 07/16/2023   AST 20 07/16/2023   ALT 16 07/16/2023   PROT 6.6 07/16/2023   ALBUMIN 4.3 07/16/2023   CALCIUM 9.0 07/16/2023   GFRAA 110 05/16/2019    Speciality Comments: No specialty comments available.  Procedures:  No procedures performed Allergies: Z-pak [azithromycin], Amoxicillin, Augmentin [amoxicillin-pot clavulanate], and Sulfa antibiotics   Assessment / Plan:     Visit Diagnoses: Myofascial pain syndrome  Medication monitoring encounter  Trapezius muscle spasm  Primary osteoarthritis of both hands  Trochanteric bursitis of both hips  Primary osteoarthritis of both feet  Plantar fasciitis  Elevated alkaline phosphatase level  HLA B27 (HLA B27 positive)  Hyperparathyroidism (HCC)  Vitamin D deficiency  History of migraine  Orders: No orders of the defined types were placed in this encounter.  No orders of the defined types were placed in this encounter.   Face-to-face time spent with patient was *** minutes. Greater than 50% of time was spent in counseling and coordination of care.  Follow-Up Instructions: No follow-ups on file.   Romayne Clubs, PA-C  Note - This record has been created using Dragon software.  Chart creation errors have been sought, but may not always  have been located. Such creation errors  do not reflect on  the standard of medical care.

## 2023-11-06 NOTE — Progress Notes (Signed)
 Chief Complaint  Patient presents with   Foot Pain    Follow up plantar fasciitis left   "Some days are good, but then I have days that are really bad." Patient taking meloxicam  as needed   HPI: 58 y.o. female presenting today for follow-up of left plantar fasciitis.  She does not note any significant improvement at this time.  She has not yet started physical therapy.  She has not had her custom orthotic consult yet with our pedorthist.  Past Medical History:  Diagnosis Date   Arthritis    Bursitis of hip    bilateral   Chronic headaches    Deviated nasal septum    Factor V Leiden [facility])    mother with h/o Factor V Leiden   Fibromyalgia    Gall bladder polyp    x3   GERD (gastroesophageal reflux disease)    HPV test positive 03/2012   neg Pap, +16/18 HPV   Mitral valve prolapse    Molluscum contagiosum    Myofascial pain 2013   Dr. Alvira Josephs   Obesity    Osteopenia    Plantar fasciitis    Seizures (HCC)    Tendonitis of elbow, right     Past Surgical History:  Procedure Laterality Date   CESAREAN SECTION  1997, 2000   CHOLECYSTECTOMY N/A 07/03/2022   Procedure: LAPAROSCOPIC CHOLECYSTECTOMY;  Surgeon: Adalberto Acton, MD;  Location: [facility];  Service: General;  Laterality: N/A;   TEMPOROMANDIBULAR JOINT SURGERY Left 1988   WISDOM TOOTH EXTRACTION      Allergies  Allergen Reactions   Z-Pak [Azithromycin ]     GI upset   Amoxicillin Rash    Yeast Infection     Augmentin [Amoxicillin-Pot Clavulanate]     Yeast Infection    Sulfa Antibiotics Rash     Physical Exam: Dermatology:  No ecchymosis, erythema, or edema bilateral.  No open lesions.    Vascular: Palpable pedal pulses bilaterally. Capillary refill within normal limits.  No appreciable edema.    Musculoskeletal Exam:  There is pain on palpation of the plantarmedial & plantarcentral aspect of left heel.  No gaps or nodules within the plantar fascia.  Positive Windlass mechanism bilateral.  Antalgic  gait noted with first few steps upon standing.  No pain on palpation of achilles tendon bilateral.  Ankle df less than 10 degrees with knee extended b/l.  Assessment/Plan of Care: 1. Plantar fasciitis, left     Discussed treatment options with patient today, including cortisone injection, NSAID course of treatment, stretching exercises, physical therapy, use of night splint, rest, icing the heel, arch supports/orthotics, and supportive shoe gear.    A LowDye strapping was applied to the left foot with Elastoplast  Patient encouraged to continue with physical therapy.  Anticipate significant improvement with physical therapy combined with the custom orthotics, once she is fitted for them and receives them.  Continue with meloxicam  7.5 mg daily as needed for pain   Return in about 6 weeks (around 12/18/2023) for f/u plantar fasciitis.   Joe Murders, DPM, FACFAS [facility]     [address], [place] [address]  Office [phone]  Fax [phone]

## 2023-11-20 ENCOUNTER — Ambulatory Visit: Payer: 59 | Admitting: Physician Assistant

## 2023-11-20 DIAGNOSIS — E213 Hyperparathyroidism, unspecified: Secondary | ICD-10-CM

## 2023-11-20 DIAGNOSIS — M19071 Primary osteoarthritis, right ankle and foot: Secondary | ICD-10-CM

## 2023-11-20 DIAGNOSIS — Z5181 Encounter for therapeutic drug level monitoring: Secondary | ICD-10-CM

## 2023-11-20 DIAGNOSIS — E8889 Other specified metabolic disorders: Secondary | ICD-10-CM | POA: Diagnosis not present

## 2023-11-20 DIAGNOSIS — M7918 Myalgia, other site: Secondary | ICD-10-CM | POA: Diagnosis not present

## 2023-11-20 DIAGNOSIS — R748 Abnormal levels of other serum enzymes: Secondary | ICD-10-CM

## 2023-11-20 DIAGNOSIS — Z8669 Personal history of other diseases of the nervous system and sense organs: Secondary | ICD-10-CM

## 2023-11-20 DIAGNOSIS — R1013 Epigastric pain: Secondary | ICD-10-CM | POA: Diagnosis not present

## 2023-11-20 DIAGNOSIS — M7061 Trochanteric bursitis, right hip: Secondary | ICD-10-CM

## 2023-11-20 DIAGNOSIS — E559 Vitamin D deficiency, unspecified: Secondary | ICD-10-CM

## 2023-11-20 DIAGNOSIS — Z1589 Genetic susceptibility to other disease: Secondary | ICD-10-CM

## 2023-11-20 DIAGNOSIS — J302 Other seasonal allergic rhinitis: Secondary | ICD-10-CM | POA: Diagnosis not present

## 2023-11-20 DIAGNOSIS — M19041 Primary osteoarthritis, right hand: Secondary | ICD-10-CM

## 2023-11-20 DIAGNOSIS — M722 Plantar fascial fibromatosis: Secondary | ICD-10-CM

## 2023-11-20 DIAGNOSIS — M62838 Other muscle spasm: Secondary | ICD-10-CM

## 2023-11-21 ENCOUNTER — Ambulatory Visit: Admitting: Physical Therapy

## 2023-11-23 ENCOUNTER — Other Ambulatory Visit: Payer: Self-pay | Admitting: *Deleted

## 2023-11-23 MED ORDER — TRAMADOL HCL 50 MG PO TABS
50.0000 mg | ORAL_TABLET | Freq: Every day | ORAL | 0 refills | Status: DC | PRN
Start: 2023-11-23 — End: 2024-04-25

## 2023-11-23 NOTE — Telephone Encounter (Signed)
 Patient called the office requesting refill on Tramadol .  Last Fill: 10/31/2023   UDS:05/22/2023 UDS is consistent with tramadol  use.    Narc Agreement: 05/22/2023   Next Visit: 12/06/2023   Last Visit: 05/21/2024   Dx: Myofascial pain syndrome    Current Dose per office note on 05/22/2023: tramadol  50mg  daily as needed    Patient to update UDS and Narcotic agreement at upcoming appointment on 12/06/2023   Okay to refill Tramadol ?

## 2023-11-26 ENCOUNTER — Telehealth: Payer: Self-pay | Admitting: Rheumatology

## 2023-11-26 NOTE — Telephone Encounter (Signed)
 Spoke with patient on Friday 11/23/2023. Prescription sent to the pharmacy by Dr. Alvira Josephs

## 2023-11-26 NOTE — Telephone Encounter (Signed)
 Patient left a voicemail (11/23/23 at 2:52 pm) stating she spoke to Cain Castillo about 20 min ago regarding her prescription of Tramadol .  Patient states she called the pharmacy and was told they received the prescription on 10/31/23, but it needed authorization and the request was sent back to Dr. Alvira Josephs on 11/03/23.  Patient states they are still waiting for the authorization before it can be filled.  Patient requested a return call.

## 2023-11-29 NOTE — Progress Notes (Unsigned)
 Office Visit Note  Patient: Brianna [name]             Date of Birth: Oct 09, 1965           MRN: [id number]             PCP: Ransom Byers, MD Referring: Ransom Byers, * Visit Date: 12/06/2023 Occupation: @GUAROCC @  Subjective:  Bilateral trochanteric bursitis   History of Present Illness: Brianna [name] is a 58 y.o. female with history of myofascial pain and osteoarthritis.   Patient presents today with bilateral hip pain consistent with trochanter bursitis.  She has been experiencing interrupted sleep at night due to nocturnal pain when lying on her sides.  Patient says she is also been experiencing plantar fasciitis involving the left foot.  Patient states that she has tried a cortisone injection with no relief.  She has been wearing a night brace and will be starting physical therapy tomorrow.  Patient has been taking tramadol  sparingly for pain relief.  She also takes methocarbamol  500 mg 1 tablet twice daily as needed for muscle spasms.    Activities of Daily Living:  Patient reports morning stiffness for several hours.   Patient Denies nocturnal pain.  Difficulty dressing/grooming: Denies Difficulty climbing stairs: Denies Difficulty getting out of chair: Denies Difficulty using hands for taps, buttons, cutlery, and/or writing: Denies  Review of Systems  Constitutional:  Positive for fatigue.  HENT:  Negative for mouth sores, mouth dryness and nose dryness.   Eyes:  Negative for pain and dryness.  Respiratory:  Negative for shortness of breath.   Cardiovascular:  Positive for palpitations. Negative for chest pain.  Gastrointestinal:  Negative for blood in stool, constipation and diarrhea.  Endocrine: Negative for increased urination.  Genitourinary:  Negative for involuntary urination.  Musculoskeletal:  Positive for joint pain, gait problem, joint pain, myalgias, muscle weakness, morning stiffness, muscle tenderness and myalgias. Negative for joint  swelling.  Skin:  Negative for color change, rash, hair loss and sensitivity to sunlight.  Allergic/Immunologic: Negative for susceptible to infections.  Neurological:  Positive for headaches. Negative for dizziness.  Hematological:  Negative for swollen glands.  Psychiatric/Behavioral:  Positive for sleep disturbance. Negative for depressed mood. The patient is not nervous/anxious.     PMFS History:  Patient Active Problem List   Diagnosis Date Noted   Elevated alkaline phosphatase level 09/01/2021   Cracked lips 07/04/2020   Deviated septum 04/08/2019   Chronic nasal congestion 04/08/2019   Hypertrophy of both inferior nasal turbinates 04/08/2019   Myofascial pain syndrome 08/17/2016   HLA B27 (HLA B27 positive) 08/17/2016   Trochanteric bursitis of both hips 08/17/2016   History of migraine 08/17/2016   Primary osteoarthritis of both hands 08/17/2016   Primary osteoarthritis of both feet 08/17/2016   Plantar fasciitis 08/17/2016   Hyperparathyroidism (HCC) 09/20/2015   High risk HPV infection 05/19/2013    Past Medical History:  Diagnosis Date   Arthritis    Bursitis of hip    bilateral   Chronic headaches    Deviated nasal septum    Factor V Leiden [facility])    mother with h/o Factor V Leiden   Fibromyalgia    Gall bladder polyp    x3   GERD (gastroesophageal reflux disease)    HPV test positive 03/2012   neg Pap, +16/18 HPV   Mitral valve prolapse    Molluscum contagiosum    Myofascial pain 2013   Dr. Alvira Josephs   Obesity  Osteopenia    Plantar fasciitis    Seizures (HCC)    Tendonitis of elbow, right     Family History  Problem Relation Age of Onset   COPD Mother    Rheum arthritis Mother    Hyperlipidemia Mother    Heart attack Mother        01/2016   Colon polyps Mother    Factor V Leiden deficiency Mother    Diabetes Father    Hypertension Father    Hyperlipidemia Father    Parkinson's disease Father    Colon polyps Sister 15       pre-cancerous  polyps-had surgery   Heart disease Sister    Graves' disease Sister    Hashimoto's thyroiditis Sister    Factor V Leiden deficiency Sister    Breast cancer Maternal Aunt    Stomach cancer Paternal Aunt    Factor V Leiden deficiency Son    Hyperparathyroidism Neg Hx    Past Surgical History:  Procedure Laterality Date   CESAREAN SECTION  1997, 2000   CHOLECYSTECTOMY N/A 07/03/2022   Procedure: LAPAROSCOPIC CHOLECYSTECTOMY;  Surgeon: Adalberto Acton, MD;  Location: [facility];  Service: General;  Laterality: N/A;   TEMPOROMANDIBULAR JOINT SURGERY Left 1988   WISDOM TOOTH EXTRACTION     Social History   Social History Narrative   ** Merged History Encounter **       Patient is right-handed. She lives with her husband in a 2 level home. She drinks tea 1-2 x a day. And an occasional soda. She walks most days for 30 minutes.   Immunization History  Administered Date(s) Administered   Influenza, Seasonal, Injecte, Preservative Fre 07/16/2023   Influenza,inj,Quad PF,6+ Mos 04/22/2019, 06/26/2022   Influenza-Unspecified 05/18/2020, 05/20/2021   Moderna Sars-Covid-2 Vaccination 07/29/2019, 08/26/2019, 06/29/2020   Pfizer Covid-19 Vaccine Bivalent Booster 35yrs & up 04/07/2021   Tdap 07/30/2015   Zoster Recombinant(Shingrix ) 08/24/2018, 06/20/2021     Objective: Vital Signs: BP 101/68 (BP Location: Left Arm, Patient Position: Sitting, Cuff Size: Normal)   Pulse 69   Resp 15   Ht 5\' 9"  (1.753 m)   Wt 214 lb 6.4 oz (97.3 kg)   LMP 12/22/2012   BMI 31.66 kg/m    Physical Exam Vitals and nursing note reviewed.  Constitutional:      Appearance: She is well-developed.  HENT:     Head: Normocephalic and atraumatic.  Eyes:     Conjunctiva/sclera: Conjunctivae normal.  Cardiovascular:     Rate and Rhythm: Normal rate and regular rhythm.     Heart sounds: Normal heart sounds.  Pulmonary:     Effort: Pulmonary effort is normal.     Breath sounds: Normal breath sounds.  Abdominal:      General: Bowel sounds are normal.     Palpations: Abdomen is soft.  Musculoskeletal:     Cervical back: Normal range of motion.  Lymphadenopathy:     Cervical: No cervical adenopathy.  Skin:    General: Skin is warm and dry.     Capillary Refill: Capillary refill takes less than 2 seconds.  Neurological:     Mental Status: She is alert and oriented to person, place, and time.  Psychiatric:        Behavior: Behavior normal.      Musculoskeletal Exam: C-spine, thoracic spine, lumbar spine have good range of motion.  Trapezius muscle tension and tenderness bilaterally.  Shoulder joints, elbow joints, wrist joints, MCPs, PIPs, DIPs have good range of motion  with no synovitis.  Complete fist formation bilaterally.  Hip joints have good range of motion with no groin pain.  Tenderness over bilateral trochanteric bursa.  Knee joints have good range of motion with no warmth or effusion.  Ankle joints have good range of motion with no tenderness or joint swelling.  CDAI Exam: CDAI Score: -- Patient Global: --; Provider Global: -- Swollen: --; Tender: -- Joint Exam 12/06/2023   No joint exam has been documented for this visit   There is currently no information documented on the homunculus. Go to the Rheumatology activity and complete the homunculus joint exam.  Investigation: No additional findings.  Imaging: No results found.  Recent Labs: Lab Results  Component Value Date   WBC 4.9 06/27/2022   HGB 13.0 06/27/2022   PLT 283 06/27/2022   NA 141 07/16/2023   K 3.7 07/16/2023   CL 105 07/16/2023   CO2 23 07/16/2023   GLUCOSE 86 07/16/2023   BUN 17 07/16/2023   CREATININE 0.80 07/16/2023   BILITOT 0.7 07/16/2023   ALKPHOS 193 (H) 07/16/2023   AST 20 07/16/2023   ALT 16 07/16/2023   PROT 6.6 07/16/2023   ALBUMIN 4.3 07/16/2023   CALCIUM 9.0 07/16/2023   GFRAA 110 05/16/2019    Speciality Comments: No specialty comments available.  Procedures:  Large Joint Inj:  bilateral greater trochanter on 12/06/2023 8:29 AM Indications: pain Details: 27 G 1.5 in needle, lateral approach  Arthrogram: No  Medications (Right): 1.5 mL lidocaine  1 %; 40 mg triamcinolone  acetonide 40 MG/ML Aspirate (Right): 0 mL Medications (Left): 1.5 mL lidocaine  1 %; 40 mg triamcinolone  acetonide 40 MG/ML Aspirate (Left): 0 mL Outcome: tolerated well, no immediate complications Procedure, treatment alternatives, risks and benefits explained, specific risks discussed. Consent was given by the patient. Immediately prior to procedure a time out was called to verify the correct patient, procedure, equipment, support staff and site/side marked as required. Patient was prepped and draped in the usual sterile fashion.     Allergies: Z-pak [azithromycin ], Amoxicillin, Augmentin [amoxicillin-pot clavulanate], and Sulfa antibiotics   Assessment / Plan:     Visit Diagnoses: Myofascial pain syndrome -She continues to experience intermittent myalgias and muscle tenderness due to fibromyalgia.  She takes methocarbamol  500 mg 1 tablet twice daily as needed for muscle spasms and tramadol  sparingly for pain relief during flares.   She presents today with bilateral hip pain consistent with trochanter bursitis.  She requested bilateral trochanteric bursa cortisone injections today.  She tolerated procedures well.  Procedure notes were completed above.  Aftercare was discussed.  Discussed the importance of good sleep hygiene and regular exercise.  She will follow-up in the office in 6 months or sooner if needed.  Medication monitoring encounter - She takes tramadol  sparingly for pain relief.  UDS and narcotic agreement were updated today on 12/06/2023.- Plan: DRUG MONITOR, TRAMADOL ,QN, URINE, DRUG MONITOR, PANEL 5, W/CONF, URINE  Trapezius muscle spasm: She continues to experience intermittent trapezius muscle tension and tenderness bilaterally.  She has been taking methocarbamol  500 mg 1 tablet twice  daily as needed for muscle spasms.  Primary osteoarthritis of both hands: Family history of rheumatoid arthritis.  No tenderness or synovitis was noted on examination today.  Complete fist formation noted.  Trochanteric bursitis of both hips -patient presents today with a recurrence of pain involving both hips consistent with trochanteric bursitis.  She has been experiencing interrupted sleep at night due to nocturnal pain when laying on her sides.  Different treatment  options were discussed today.  She has tried home exercises.  She has requested bilateral trochanteric bursa cortisone injections today.  She tolerated procedures well.  Procedure notes were completed above.  Aftercare was discussed.  Plantar fasciitis: Left foot: She has tried a cortisone injection with no relief.  She has been wearing a night brace and will be starting physical therapy tomorrow.  Primary osteoarthritis of both feet: Good range of motion of both ankle joints with no tenderness or joint swelling.  No evidence of Achilles tendinitis.  She is currently experiencing discomfort in the left foot due to plantar fasciitis.  She will be starting physical therapy tomorrow.  Other medical conditions are listed as follows:  Elevated alkaline phosphatase level - Chronically elevated alk phos.  Patient has undergone a thorough workup by gastroenterology. Alk phos was 193 on 07/16/2023.  HLA B27 (HLA B27 positive)  Hyperparathyroidism (HCC)  History of migraine  Vitamin D  deficiency: She is taking vitamin D  50,000 units once every 14 days.  Orders: Orders Placed This Encounter  Procedures   Large Joint Inj   DRUG MONITOR, TRAMADOL ,QN, URINE   DRUG MONITOR, PANEL 5, W/CONF, URINE   No orders of the defined types were placed in this encounter.    Follow-Up Instructions: Return in about 6 months (around 06/07/2024) for Myofascial pain, Osteoarthritis.   Romayne Clubs, PA-C  Note - This record has been created  using Dragon software.  Chart creation errors have been sought, but may not always  have been located. Such creation errors do not reflect on  the standard of medical care.

## 2023-12-03 ENCOUNTER — Ambulatory Visit: Admitting: Physical Therapy

## 2023-12-04 DIAGNOSIS — M7918 Myalgia, other site: Secondary | ICD-10-CM | POA: Diagnosis not present

## 2023-12-04 DIAGNOSIS — G43809 Other migraine, not intractable, without status migrainosus: Secondary | ICD-10-CM | POA: Diagnosis not present

## 2023-12-04 DIAGNOSIS — R002 Palpitations: Secondary | ICD-10-CM | POA: Diagnosis not present

## 2023-12-04 DIAGNOSIS — Z9049 Acquired absence of other specified parts of digestive tract: Secondary | ICD-10-CM | POA: Diagnosis not present

## 2023-12-06 ENCOUNTER — Encounter: Payer: Self-pay | Admitting: Physician Assistant

## 2023-12-06 ENCOUNTER — Ambulatory Visit: Payer: Self-pay | Attending: Physician Assistant | Admitting: Physician Assistant

## 2023-12-06 VITALS — BP 101/68 | HR 69 | Resp 15 | Ht 69.0 in | Wt 214.4 lb

## 2023-12-06 DIAGNOSIS — M19041 Primary osteoarthritis, right hand: Secondary | ICD-10-CM

## 2023-12-06 DIAGNOSIS — M7918 Myalgia, other site: Secondary | ICD-10-CM

## 2023-12-06 DIAGNOSIS — M7061 Trochanteric bursitis, right hip: Secondary | ICD-10-CM

## 2023-12-06 DIAGNOSIS — Z1589 Genetic susceptibility to other disease: Secondary | ICD-10-CM

## 2023-12-06 DIAGNOSIS — E559 Vitamin D deficiency, unspecified: Secondary | ICD-10-CM

## 2023-12-06 DIAGNOSIS — M7062 Trochanteric bursitis, left hip: Secondary | ICD-10-CM

## 2023-12-06 DIAGNOSIS — M62838 Other muscle spasm: Secondary | ICD-10-CM

## 2023-12-06 DIAGNOSIS — M19042 Primary osteoarthritis, left hand: Secondary | ICD-10-CM

## 2023-12-06 DIAGNOSIS — R748 Abnormal levels of other serum enzymes: Secondary | ICD-10-CM

## 2023-12-06 DIAGNOSIS — Z5181 Encounter for therapeutic drug level monitoring: Secondary | ICD-10-CM

## 2023-12-06 DIAGNOSIS — M19071 Primary osteoarthritis, right ankle and foot: Secondary | ICD-10-CM

## 2023-12-06 DIAGNOSIS — E213 Hyperparathyroidism, unspecified: Secondary | ICD-10-CM

## 2023-12-06 DIAGNOSIS — M722 Plantar fascial fibromatosis: Secondary | ICD-10-CM

## 2023-12-06 DIAGNOSIS — M19072 Primary osteoarthritis, left ankle and foot: Secondary | ICD-10-CM

## 2023-12-06 DIAGNOSIS — Z8669 Personal history of other diseases of the nervous system and sense organs: Secondary | ICD-10-CM

## 2023-12-06 MED ORDER — LIDOCAINE HCL 1 % IJ SOLN
1.5000 mL | INTRAMUSCULAR | Status: AC | PRN
  Administered 2023-12-06: 1.5 mL

## 2023-12-06 MED ORDER — TRIAMCINOLONE ACETONIDE 40 MG/ML IJ SUSP
40.0000 mg | INTRAMUSCULAR | Status: AC | PRN
  Administered 2023-12-06: 40 mg via INTRA_ARTICULAR

## 2023-12-06 MED ORDER — LIDOCAINE HCL 1 % IJ SOLN
1.5000 mL | INTRAMUSCULAR | Status: AC | PRN
Start: 2023-12-06 — End: 2023-12-06
  Administered 2023-12-06: 1.5 mL

## 2023-12-07 ENCOUNTER — Ambulatory Visit

## 2023-12-07 LAB — DM TEMPLATE

## 2023-12-09 ENCOUNTER — Ambulatory Visit: Payer: Self-pay | Admitting: Physician Assistant

## 2023-12-09 LAB — DRUG MONITOR, PANEL 5, W/CONF, URINE
Amphetamines: NEGATIVE ng/mL (ref <500)
Barbiturates: NEGATIVE ng/mL (ref <300)
Benzodiazepines: NEGATIVE ng/mL (ref <100)
Cocaine Metabolite: NEGATIVE ng/mL (ref <150)
Creatinine: 174.6 mg/dL (ref >=20.0)
Marijuana Metabolite: NEGATIVE ng/mL (ref <20)
Methadone Metabolite: NEGATIVE ng/mL (ref <100)
Opiates: NEGATIVE ng/mL (ref <100)
Oxidant: NEGATIVE ug/mL (ref <200)
Oxycodone: NEGATIVE ng/mL (ref <100)
pH: 5.1 (ref 4.5–9.0)

## 2023-12-09 LAB — DM TEMPLATE

## 2023-12-09 LAB — DRUG MONITOR, TRAMADOL,QN, URINE
Desmethyltramadol: 6246 ng/mL — ABNORMAL HIGH (ref <100)
Tramadol: 3363 ng/mL — ABNORMAL HIGH (ref <100)

## 2023-12-09 NOTE — Progress Notes (Signed)
 UDS is consistent with treatment.

## 2023-12-12 ENCOUNTER — Telehealth: Payer: Self-pay

## 2023-12-12 DIAGNOSIS — J3089 Other allergic rhinitis: Secondary | ICD-10-CM | POA: Diagnosis not present

## 2023-12-12 NOTE — Telephone Encounter (Signed)
 LVM to resched; provider out of office

## 2023-12-24 ENCOUNTER — Ambulatory Visit: Admitting: Podiatry

## 2023-12-26 DIAGNOSIS — R300 Dysuria: Secondary | ICD-10-CM | POA: Diagnosis not present

## 2023-12-26 DIAGNOSIS — N3000 Acute cystitis without hematuria: Secondary | ICD-10-CM | POA: Diagnosis not present

## 2023-12-31 ENCOUNTER — Other Ambulatory Visit

## 2024-01-08 ENCOUNTER — Ambulatory Visit: Admitting: Podiatry

## 2024-01-29 DIAGNOSIS — M797 Fibromyalgia: Secondary | ICD-10-CM | POA: Diagnosis not present

## 2024-01-29 DIAGNOSIS — G43809 Other migraine, not intractable, without status migrainosus: Secondary | ICD-10-CM | POA: Diagnosis not present

## 2024-01-29 DIAGNOSIS — Z9189 Other specified personal risk factors, not elsewhere classified: Secondary | ICD-10-CM | POA: Diagnosis not present

## 2024-01-29 DIAGNOSIS — E559 Vitamin D deficiency, unspecified: Secondary | ICD-10-CM | POA: Diagnosis not present

## 2024-03-27 DIAGNOSIS — M797 Fibromyalgia: Secondary | ICD-10-CM | POA: Diagnosis not present

## 2024-03-27 DIAGNOSIS — E66811 Obesity, class 1: Secondary | ICD-10-CM | POA: Diagnosis not present

## 2024-03-27 DIAGNOSIS — G43809 Other migraine, not intractable, without status migrainosus: Secondary | ICD-10-CM | POA: Diagnosis not present

## 2024-03-27 DIAGNOSIS — E559 Vitamin D deficiency, unspecified: Secondary | ICD-10-CM | POA: Diagnosis not present

## 2024-04-10 DIAGNOSIS — K5790 Diverticulosis of intestine, part unspecified, without perforation or abscess without bleeding: Secondary | ICD-10-CM | POA: Diagnosis not present

## 2024-04-10 DIAGNOSIS — Z83719 Family history of colon polyps, unspecified: Secondary | ICD-10-CM | POA: Diagnosis not present

## 2024-04-10 DIAGNOSIS — Z1211 Encounter for screening for malignant neoplasm of colon: Secondary | ICD-10-CM | POA: Diagnosis not present

## 2024-04-16 ENCOUNTER — Other Ambulatory Visit: Payer: Self-pay | Admitting: Family Medicine

## 2024-04-16 DIAGNOSIS — Z1231 Encounter for screening mammogram for malignant neoplasm of breast: Secondary | ICD-10-CM

## 2024-04-25 ENCOUNTER — Other Ambulatory Visit: Payer: Self-pay | Admitting: Rheumatology

## 2024-04-25 ENCOUNTER — Other Ambulatory Visit: Payer: Self-pay | Admitting: Physician Assistant

## 2024-04-25 NOTE — Telephone Encounter (Signed)
 Last Fill: 11/23/2023  UDS:12/06/2023 c/w  Narc Agreement: 12/06/2023  Next Visit: 06/10/2024  Last Visit: 12/06/2023  Dx: Myofascial pain syndrome   Current Dose per office note on 12/06/2023:tramadol  sparingly for pain relief.    Okay to refill tramadol ?

## 2024-04-25 NOTE — Telephone Encounter (Signed)
 Last Fill: 11/27/2022  Next Visit: 06/10/2024  Last Visit: 12/06/2022  Dx: Myofascial pain syndrome   Current Dose per office note on 12/06/2023: methocarbamol  500 mg 1 tablet twice daily as needed   Okay to refill Methocarbamol ?

## 2024-05-06 DIAGNOSIS — E559 Vitamin D deficiency, unspecified: Secondary | ICD-10-CM | POA: Diagnosis not present

## 2024-05-06 DIAGNOSIS — G43809 Other migraine, not intractable, without status migrainosus: Secondary | ICD-10-CM | POA: Diagnosis not present

## 2024-05-06 DIAGNOSIS — M797 Fibromyalgia: Secondary | ICD-10-CM | POA: Diagnosis not present

## 2024-05-18 ENCOUNTER — Other Ambulatory Visit: Payer: Self-pay | Admitting: Adult Health

## 2024-05-26 ENCOUNTER — Encounter: Payer: Self-pay | Admitting: Radiology

## 2024-05-28 NOTE — Progress Notes (Signed)
 Office Visit Note  Patient: Brianna [name]             Date of Birth: [birth year]           MRN: [id number]             PCP: Chrystal Lamarr RAMAN, MD Referring: Chrystal Lamarr RAMAN, * Visit Date: 06/10/2024 Occupation: Data Unavailable  Subjective:  Pain in joints and muscles  History of Present Illness: Brianna [name] is a 58 y.o. female with myofascial pain syndrome, osteoarthritis.  She returns today after her last visit in May 2025.  She states the bilateral trochanteric bursa are doing better.  She has off-and-on discomfort in the trapezius region and her hands.  She has not had recent problems with plantar fasciitis.  She takes tramadol  50 mg p.o. twice daily as needed only.  She states during the flares her pain level on the scale of 0-10 could be 10 and with tramadol  it comes down to 5.    Activities of Daily Living:  Patient reports morning stiffness for 30 minutes.   Patient Reports nocturnal pain.  Difficulty dressing/grooming: Denies Difficulty climbing stairs: Denies Difficulty getting out of chair: Denies Difficulty using hands for taps, buttons, cutlery, and/or writing: Denies  Review of Systems  Constitutional:  Positive for fatigue.  HENT:  Negative for mouth sores and mouth dryness.   Eyes:  Negative for dryness.  Respiratory:  Negative for shortness of breath.   Cardiovascular:  Positive for palpitations. Negative for chest pain.  Gastrointestinal:  Negative for blood in stool, constipation and diarrhea.  Endocrine: Negative for increased urination.  Genitourinary:  Negative for involuntary urination.  Musculoskeletal:  Positive for joint pain, joint pain, joint swelling, myalgias, morning stiffness, muscle tenderness and myalgias. Negative for gait problem and muscle weakness.  Skin:  Negative for color change, rash, hair loss and sensitivity to sunlight.  Allergic/Immunologic: Negative for susceptible to infections.  Neurological:  Positive for  headaches. Negative for dizziness.  Hematological:  Negative for swollen glands.  Psychiatric/Behavioral:  Positive for sleep disturbance. Negative for depressed mood. The patient is not nervous/anxious.     PMFS History:  Patient Active Problem List   Diagnosis Date Noted   Elevated alkaline phosphatase level 09/01/2021   Cracked lips 07/04/2020   Deviated septum 04/08/2019   Chronic nasal congestion 04/08/2019   Hypertrophy of both inferior nasal turbinates 04/08/2019   Myofascial pain syndrome 08/17/2016   HLA B27 (HLA B27 positive) 08/17/2016   Trochanteric bursitis of both hips 08/17/2016   History of migraine 08/17/2016   Primary osteoarthritis of both hands 08/17/2016   Primary osteoarthritis of both feet 08/17/2016   Plantar fasciitis 08/17/2016   Hyperparathyroidism 09/20/2015   High risk HPV infection 05/19/2013    Past Medical History:  Diagnosis Date   Arthritis    Bursitis of hip    bilateral   Chronic headaches    Deviated nasal septum    Factor V Leiden    mother with h/o Factor V Leiden   Fibromyalgia    Gall bladder polyp    x3   GERD (gastroesophageal reflux disease)    HPV test positive 03/2012   neg Pap, +16/18 HPV   Mitral valve prolapse    Molluscum contagiosum    Myofascial pain 2013   Dr. Dolphus   Obesity    Osteopenia    Plantar fasciitis    Seizures (HCC)    Tendonitis of elbow, right  Family History  Problem Relation Age of Onset   COPD Mother    Rheum arthritis Mother    Hyperlipidemia Mother    Heart attack Mother        01/2016   Colon polyps Mother    Factor V Leiden deficiency Mother    Diabetes Father    Hypertension Father    Hyperlipidemia Father    Parkinson's disease Father    Colon polyps Sister 85       pre-cancerous polyps-had surgery   Heart disease Sister    Graves' disease Sister    Hashimoto's thyroiditis Sister    Factor V Leiden deficiency Sister    Breast cancer Maternal Aunt    Stomach cancer  Paternal Aunt    Factor V Leiden deficiency Son    Hyperparathyroidism Neg Hx    Past Surgical History:  Procedure Laterality Date   CESAREAN SECTION  1997, 2000   CHOLECYSTECTOMY N/A 07/03/2022   Procedure: LAPAROSCOPIC CHOLECYSTECTOMY;  Surgeon: Signe Mitzie LABOR, MD;  Location: [facility];  Service: General;  Laterality: N/A;   COLONOSCOPY  05/30/2024   TEMPOROMANDIBULAR JOINT SURGERY Left 1988   WISDOM TOOTH EXTRACTION     Social History   Tobacco Use   Smoking status: Never    Passive exposure: Past   Smokeless tobacco: Never  Vaping Use   Vaping status: Never Used  Substance Use Topics   Alcohol use: Yes    Comment: rarely   Drug use: No   Social History   Social History Narrative   ** Merged History Encounter **       Patient is right-handed. She lives with her husband in a 2 level home. She drinks tea 1-2 x a day. And an occasional soda. She walks most days for 30 minutes.     Immunization History  Administered Date(s) Administered   Influenza, Seasonal, Injecte, Preservative Fre 07/16/2023   Influenza,inj,Quad PF,6+ Mos 04/22/2019, 06/26/2022   Influenza-Unspecified 05/18/2020, 05/20/2021   Moderna Sars-Covid-2 Vaccination 07/29/2019, 08/26/2019, 06/29/2020   Pfizer Covid-19 Vaccine Bivalent Booster 33yrs & up 04/07/2021   Tdap 07/30/2015   Zoster Recombinant(Shingrix ) 08/24/2018, 06/20/2021     Objective: Vital Signs: BP 102/68   Pulse 67   Temp 97.6 F (36.4 C)   Resp 17   Ht 5' 9 (1.753 m)   Wt 193 lb 3.2 [name] (87.6 kg)   LMP 12/22/2012   BMI 28.53 kg/m    Physical Exam Vitals and nursing note reviewed.  Constitutional:      Appearance: She is well-developed.  HENT:     Head: Normocephalic and atraumatic.  Eyes:     Conjunctiva/sclera: Conjunctivae normal.  Cardiovascular:     Rate and Rhythm: Normal rate and regular rhythm.     Heart sounds: Normal heart sounds.  Pulmonary:     Effort: Pulmonary effort is normal.     Breath sounds: Normal  breath sounds.  Abdominal:     General: Bowel sounds are normal.     Palpations: Abdomen is soft.  Musculoskeletal:     Cervical back: Normal range of motion.  Lymphadenopathy:     Cervical: No cervical adenopathy.  Skin:    General: Skin is warm and dry.     Capillary Refill: Capillary refill takes less than 2 seconds.  Neurological:     Mental Status: She is alert and oriented to person, place, and time.  Psychiatric:        Behavior: Behavior normal.  Musculoskeletal Exam: She had good range of motion of the cervical spine without discomfort.  There was no tenderness over thoracic or lumbar spine.  Shoulders, elbows, wrist joints, MCPs PIPs and DIPs were improved range of motion.  She has thickening of right first PIP joint.  Hip joints and knee joints in good range of motion without any tenderness.  There was no tenderness over ankles or MTPs.  CDAI Exam: CDAI Score: -- Patient Global: --; Provider Global: -- Swollen: --; Tender: -- Joint Exam 06/10/2024   No joint exam has been documented for this visit   There is currently no information documented on the homunculus. Go to the Rheumatology activity and complete the homunculus joint exam.  Investigation: No additional findings.  Imaging: MM 3D SCREENING MAMMOGRAM BILATERAL BREAST Result Date: 06/03/2024 CLINICAL DATA:  Screening. EXAM: DIGITAL SCREENING BILATERAL MAMMOGRAM WITH TOMOSYNTHESIS AND CAD TECHNIQUE: Bilateral screening digital craniocaudal and mediolateral oblique mammograms were obtained. Bilateral screening digital breast tomosynthesis was performed. The images were evaluated with computer-aided detection. COMPARISON:  Previous exam(s). ACR Breast Density Category b: There are scattered areas of fibroglandular density. FINDINGS: There are no findings suspicious for malignancy. IMPRESSION: No mammographic evidence of malignancy. A result letter of this screening mammogram will be mailed directly to the  patient. RECOMMENDATION: Screening mammogram in one year. (Code:SM-B-01Y) BI-RADS CATEGORY  1: Negative. Electronically Signed   By: Curtistine Noble   On: 06/03/2024 12:55    Recent Labs: Lab Results  Component Value Date   WBC 4.9 06/27/2022   HGB 13.0 06/27/2022   PLT 283 06/27/2022   NA 141 07/16/2023   K 3.7 07/16/2023   CL 105 07/16/2023   CO2 23 07/16/2023   GLUCOSE 86 07/16/2023   BUN 17 07/16/2023   CREATININE 0.80 07/16/2023   BILITOT 0.7 07/16/2023   ALKPHOS 193 (H) 07/16/2023   AST 20 07/16/2023   ALT 16 07/16/2023   PROT 6.6 07/16/2023   ALBUMIN 4.3 07/16/2023   CALCIUM 9.0 07/16/2023   GFRAA 110 05/16/2019    Speciality Comments: No specialty comments available.  Procedures:  No procedures performed Allergies: Z-pak [azithromycin ], Amoxicillin, Augmentin [amoxicillin-pot clavulanate], and Sulfa antibiotics   Assessment / Plan:     Visit Diagnoses: Myofascial pain syndrome-she continues to have generalized pain and discomfort from fibromyalgia with intermittent flares.  She takes tramadol  as needed for pain management.  Need for regular exercise was discussed.  Medication monitoring encounter - She takes tramadol  sparingly for pain relief.  UDS and narcotic agreement: 12/06/2023 - Plan: DRUG MONITOR, PANEL 5, W/CONF, URINE, DRUG MONITOR, TRAMADOL ,QN, URINE today.  Trapezius muscle spasm-she has off-and-on discomfort.  Primary osteoarthritis of both hands -mild osteoarthritic changes mostly in the right first PIP joint was noted.  Family history of rheumatoid arthritis.  Plantar fasciitis-currently not active.  Trochanteric bursitis of both hips-is intermittent pain over the trochanteric region.  IT band stretches were advised.  Primary osteoarthritis of both feet-fitting shoes were advised.  Elevated alkaline phosphatase level - Chronically elevated alk phos.  Patient has undergone a thorough workup by gastroenterology.  HLA B27 (HLA B27  positive)  Hyperparathyroidism  History of migraine  Vitamin D  deficiency  Orders: Orders Placed This Encounter  Procedures   DRUG MONITOR, PANEL 5, W/CONF, URINE   DRUG MONITOR, TRAMADOL ,QN, URINE   No orders of the defined types were placed in this encounter.    Follow-Up Instructions: Return in about 6 months (around 12/08/2024) for Osteoarthritis.   Maya Nash, MD  Note - This record has been created using [employer].  Chart creation errors have been sought, but may not always  have been located. Such creation errors do not reflect on  the standard of medical care.

## 2024-05-30 DIAGNOSIS — K5730 Diverticulosis of large intestine without perforation or abscess without bleeding: Secondary | ICD-10-CM | POA: Diagnosis not present

## 2024-05-30 DIAGNOSIS — K648 Other hemorrhoids: Secondary | ICD-10-CM | POA: Diagnosis not present

## 2024-05-30 DIAGNOSIS — Z1211 Encounter for screening for malignant neoplasm of colon: Secondary | ICD-10-CM | POA: Diagnosis not present

## 2024-05-30 DIAGNOSIS — D120 Benign neoplasm of cecum: Secondary | ICD-10-CM | POA: Diagnosis not present

## 2024-05-30 HISTORY — PX: COLONOSCOPY: SHX174

## 2024-06-02 ENCOUNTER — Ambulatory Visit
Admission: RE | Admit: 2024-06-02 | Discharge: 2024-06-02 | Disposition: A | Discharge status: Home / Self Care | Source: Ambulatory Visit | Attending: Family Medicine | Admitting: Family Medicine

## 2024-06-02 DIAGNOSIS — Z1231 Encounter for screening mammogram for malignant neoplasm of breast: Secondary | ICD-10-CM

## 2024-06-10 ENCOUNTER — Ambulatory Visit: Attending: Rheumatology | Admitting: Rheumatology

## 2024-06-10 ENCOUNTER — Encounter: Payer: Self-pay | Admitting: Rheumatology

## 2024-06-10 VITALS — BP 102/68 | HR 67 | Temp 97.6°F | Resp 17 | Ht 69.0 in | Wt 193.2 lb

## 2024-06-10 DIAGNOSIS — M19072 Primary osteoarthritis, left ankle and foot: Secondary | ICD-10-CM

## 2024-06-10 DIAGNOSIS — M19041 Primary osteoarthritis, right hand: Secondary | ICD-10-CM | POA: Diagnosis not present

## 2024-06-10 DIAGNOSIS — E559 Vitamin D deficiency, unspecified: Secondary | ICD-10-CM

## 2024-06-10 DIAGNOSIS — E213 Hyperparathyroidism, unspecified: Secondary | ICD-10-CM

## 2024-06-10 DIAGNOSIS — M722 Plantar fascial fibromatosis: Secondary | ICD-10-CM | POA: Diagnosis not present

## 2024-06-10 DIAGNOSIS — M7061 Trochanteric bursitis, right hip: Secondary | ICD-10-CM

## 2024-06-10 DIAGNOSIS — M7062 Trochanteric bursitis, left hip: Secondary | ICD-10-CM

## 2024-06-10 DIAGNOSIS — Z1589 Genetic susceptibility to other disease: Secondary | ICD-10-CM

## 2024-06-10 DIAGNOSIS — M7918 Myalgia, other site: Secondary | ICD-10-CM

## 2024-06-10 DIAGNOSIS — Z5181 Encounter for therapeutic drug level monitoring: Secondary | ICD-10-CM

## 2024-06-10 DIAGNOSIS — M19042 Primary osteoarthritis, left hand: Secondary | ICD-10-CM

## 2024-06-10 DIAGNOSIS — Z8669 Personal history of other diseases of the nervous system and sense organs: Secondary | ICD-10-CM

## 2024-06-10 DIAGNOSIS — M19071 Primary osteoarthritis, right ankle and foot: Secondary | ICD-10-CM

## 2024-06-10 DIAGNOSIS — R748 Abnormal levels of other serum enzymes: Secondary | ICD-10-CM

## 2024-06-10 DIAGNOSIS — M62838 Other muscle spasm: Secondary | ICD-10-CM

## 2024-06-11 LAB — DM TEMPLATE

## 2024-06-12 ENCOUNTER — Ambulatory Visit: Payer: Self-pay | Admitting: Rheumatology

## 2024-06-12 LAB — DRUG MONITOR, PANEL 5, W/CONF, URINE
Amphetamines: NEGATIVE ng/mL (ref <500)
Barbiturates: NEGATIVE ng/mL (ref <300)
Benzodiazepines: NEGATIVE ng/mL (ref <100)
Cocaine Metabolite: NEGATIVE ng/mL (ref <150)
Creatinine: 208.5 mg/dL (ref >=20.0)
Marijuana Metabolite: NEGATIVE ng/mL (ref <20)
Methadone Metabolite: NEGATIVE ng/mL (ref <100)
Opiates: NEGATIVE ng/mL (ref <100)
Oxidant: NEGATIVE ug/mL (ref <200)
Oxycodone: NEGATIVE ng/mL (ref <100)
pH: 5.2 (ref 4.5–9.0)

## 2024-06-12 LAB — DRUG MONITOR, TRAMADOL,QN, URINE
Desmethyltramadol: 117 ng/mL — ABNORMAL HIGH (ref <100)
Tramadol: NEGATIVE ng/mL (ref <100)

## 2024-06-12 LAB — DM TEMPLATE

## 2024-06-12 NOTE — Progress Notes (Signed)
 Left suggestive of tramadol  use.

## 2024-07-01 DIAGNOSIS — G43809 Other migraine, not intractable, without status migrainosus: Secondary | ICD-10-CM | POA: Diagnosis not present

## 2024-07-01 DIAGNOSIS — M797 Fibromyalgia: Secondary | ICD-10-CM | POA: Diagnosis not present

## 2024-07-01 DIAGNOSIS — E559 Vitamin D deficiency, unspecified: Secondary | ICD-10-CM | POA: Diagnosis not present

## 2024-07-07 ENCOUNTER — Ambulatory Visit: Admitting: Cardiovascular Disease

## 2024-07-07 ENCOUNTER — Encounter: Payer: Self-pay | Admitting: Cardiovascular Disease

## 2024-07-07 VITALS — BP 102/66 | HR 70 | Ht 69.0 in | Wt 202.8 lb

## 2024-07-07 DIAGNOSIS — E7800 Pure hypercholesterolemia, unspecified: Secondary | ICD-10-CM

## 2024-07-07 DIAGNOSIS — I341 Nonrheumatic mitral (valve) prolapse: Secondary | ICD-10-CM

## 2024-07-07 DIAGNOSIS — I4719 Other supraventricular tachycardia: Secondary | ICD-10-CM

## 2024-07-07 DIAGNOSIS — R079 Chest pain, unspecified: Secondary | ICD-10-CM | POA: Diagnosis not present

## 2024-07-07 DIAGNOSIS — R002 Palpitations: Secondary | ICD-10-CM

## 2024-07-07 NOTE — Progress Notes (Unsigned)
 Cardiology Consultation Note:    Date:  07/07/2024   ID:  [name], DOB [birth year], MRN [id number]  PCP:  Chrystal Lamarr RAMAN, MD  Cardiologist:  Jerel Balding, MD  Electrophysiologist:  None   Referring MD: Chrystal Lamarr RAMAN, MD MD  No chief complaint on file.    History of Present Illness:    Brianna [name] is a 58 y.o. female with a hx of questionable diagnosis of mitral valve prolapse, fibromyalgia, factor V Leiden without clinical thromboembolic events, referred for palpitations.  She has mildly elevated cholesterol, but also has an excellent HDL cholesterol.  Her father, Norleen Goltz, is also my patient.  She continues to have occasional palpitations.  They occur randomly without any noticeable pattern.  They occur at anytime of the day and are not associated with periods of increased emotions or physical activity.  They usually last less than 5 minutes, sometimes just a few seconds.  Event monitor showed ectopic atrial tachycardia, no atrial fibrillation.  She has recently had some problems with pain in her left shoulder.  She had 2 episodes of pressure-like sensation in both the anterior and posterior aspect of her left shoulder.  Changing position appeared to improve the symptoms.  Both occasions the symptoms occurred at rest and were not associated with physical activity.  They did occur at the end of a long day when she had worked more than usual.  Evaluation for palpitations in the late 80s led to a diagnosis of mitral valve prolapse, but an echocardiogram in 2016 did not confirm this.  The mitral valve appears structurally normal and showed only mild regurgitation left ventricular ejection fraction was 55 to 60%.  The study was interpreted showing pseudonormal mitral inflow pattern, but I believe that her mitral annulus Doppler velocities are probably normal (normal diastolic function).  The left atrium was mildly dilated.  Past Medical History:  Diagnosis Date    Arthritis    Bursitis of hip    bilateral   Chronic headaches    Deviated nasal septum    Factor V Leiden    mother with h/o Factor V Leiden   Fibromyalgia    Gall bladder polyp    x3   GERD (gastroesophageal reflux disease)    HPV test positive 03/2012   neg Pap, +16/18 HPV   Mitral valve prolapse    Molluscum contagiosum    Myofascial pain 2013   Dr. Dolphus   Obesity    Osteopenia    Plantar fasciitis    Seizures (HCC)    Tendonitis of elbow, right     Past Surgical History:  Procedure Laterality Date   CESAREAN SECTION  1997, 2000   CHOLECYSTECTOMY N/A 07/03/2022   Procedure: LAPAROSCOPIC CHOLECYSTECTOMY;  Surgeon: Signe Mitzie LABOR, MD;  Location: [facility];  Service: General;  Laterality: N/A;   COLONOSCOPY  05/30/2024   TEMPOROMANDIBULAR JOINT SURGERY Left 1988   WISDOM TOOTH EXTRACTION      Current Medications: Current Meds  Medication Sig   Atogepant (QULIPTA) 60 MG TABS Take 60 mg by mouth daily.   azelastine (ASTELIN) 0.1 % nasal spray Place 1 spray into both nostrils 2 (two) times daily.   Coenzyme Q10 (CO Q 10) 100 MG CAPS Take 100 mg by mouth daily.   fluticasone  (FLONASE ) 50 MCG/ACT nasal spray Place 1 spray into both nostrils daily.   ibuprofen (ADVIL) 200 MG tablet Take 400 mg by mouth every 8 (eight) hours as needed (pain).   levocetirizine (  XYZAL) 5 MG tablet Take 5 mg by mouth every evening.   Magnesium 500 MG CAPS Take 500 mg by mouth daily.   Misc Natural Products (TART CHERRY ADVANCED PO) Take 1 capsule by mouth daily.   montelukast (SINGULAIR) 10 MG tablet Take 10 mg by mouth daily.   Multiple Vitamins-Minerals (MULTIVITAMIN WITH MINERALS) tablet Take 1 tablet by mouth daily.   Probiotic Product (PROBIOTIC DAILY PO) Take 1 capsule by mouth daily.   triamcinolone  cream (KENALOG ) 0.1 % Apply 1 Application topically 2 (two) times daily as needed.   vitamin B-12 (CYANOCOBALAMIN) 500 MCG tablet Take 500 mcg by mouth daily.   Vitamin D ,  Ergocalciferol , (DRISDOL ) 1.25 MG (50000 UNIT) CAPS capsule Take 1 capsule (50,000 Units total) by mouth every 14 (fourteen) days.     Allergies:   Z-pak [azithromycin ], Amoxicillin, Augmentin [amoxicillin-pot clavulanate], and Sulfa antibiotics   Social History   Socioeconomic History   Marital status: Married    Spouse name: Ozell   Number of children: 2   Years of education: Not on file   Highest education level: Associate degree: academic program  Occupational History   Occupation: Accounting  Tobacco Use   Smoking status: Never    Passive exposure: Past   Smokeless tobacco: Never  Vaping Use   Vaping status: Never Used  Substance and Sexual Activity   Alcohol use: Yes    Comment: rarely   Drug use: No   Sexual activity: Yes    Partners: Male    Birth control/protection: Other-see comments, Post-menopausal    Comment: vasectomy  Other Topics Concern   Not on file  Social History Narrative   ** Merged History Encounter **       Patient is right-handed. She lives with her husband in a 2 level home. She drinks tea 1-2 x a day. And an occasional soda. She walks most days for 30 minutes.   Social Drivers of Health   Tobacco Use: Low Risk (07/07/2024)   Patient History    Smoking Tobacco Use: Never    Smokeless Tobacco Use: Never    Passive Exposure: Past  Financial Resource Strain: Not on file  Food Insecurity: Not on file  Transportation Needs: Not on file  Physical Activity: Not on file  Stress: Not on file  Social Connections: Not on file  Depression (PHQ2-9): Low Risk (07/16/2023)   Depression (PHQ2-9)    PHQ-2 Score: 0  Alcohol Screen: Not on file  Housing: Not on file  Utilities: Not on file  Health Literacy: Not on file     Family History: The patient's family history includes Breast cancer in her maternal aunt; COPD in her mother; Colon polyps in her mother; Colon polyps (age of onset: 44) in her sister; Diabetes in her father; Factor V Leiden  deficiency in her mother, sister, and son; Yvone' disease in her sister; Hashimoto's thyroiditis in her sister; Heart attack in her mother; Heart disease in her sister; Hyperlipidemia in her father and mother; Hypertension in her father; Parkinson's disease in her father; Rheum arthritis in her mother; Stomach cancer in her paternal aunt. There is no history of Hyperparathyroidism.  ROS:   Please see the history of present illness.     All other systems reviewed and are negative.  EKGs/Labs/Other Studies Reviewed:    The following studies were reviewed today: EVENT MONITOR 07/31/2019 The dominant rhythm is normal sinus rhythm with normal circadian variation. There are occasional brief episodes of nonsustained ectopic atrial tachycardia, 5-8  beats long. There is no meaningful ventricular arrhythmia and no atrial fibrillation. There is no significant bradycardia   Mildly abnormal event monitor due to the presence of occasional brief nonsustained atrial tachycardia.     EKG:  EKG is  ordered today. Shows normal sinus rhythm and is a completely normal tracing.  Recent Labs: 07/16/2023: ALT 16; BUN 17; Creatinine, Ser 0.80; Potassium 3.7; Sodium 141  Recent Lipid Panel    Component Value Date/Time   CHOL 219 (H) 06/20/2021 1012   TRIG 69 06/20/2021 1012   HDL 68 06/20/2021 1012   CHOLHDL 3.2 06/20/2021 1012   CHOLHDL 2.9 11/16/2016 1012   VLDL 11 11/16/2016 1012   LDLCALC 139 (H) 06/20/2021 1012    Physical Exam:    VS:  BP 102/66 (BP Location: Left Arm, Patient Position: Sitting, Cuff Size: Large)   Pulse 70   Ht 5' 9 (1.753 m)   Wt 202 lb 12.8 oz (92 kg)   LMP 12/22/2012   SpO2 97%   BMI 29.95 kg/m     Wt Readings from Last 3 Encounters:  07/07/24 202 lb 12.8 oz (92 kg)  06/10/24 193 lb 3.2 oz (87.6 kg)  12/06/23 214 lb 6.4 oz (97.3 kg)      General: Alert, oriented x3, no distress Head: no evidence of trauma, PERRL, EOMI, no exophtalmos or lid lag, no myxedema,  no xanthelasma; normal ears, nose and oropharynx Neck: normal jugular venous pulsations and no hepatojugular reflux; brisk carotid pulses without delay and no carotid bruits Chest: clear to auscultation, no signs of consolidation by percussion or palpation, normal fremitus, symmetrical and full respiratory excursions Cardiovascular: normal position and quality of the apical impulse, regular rhythm, normal first and second heart sounds, no murmurs, rubs or gallops Abdomen: no tenderness or distention, no masses by palpation, no abnormal pulsatility or arterial bruits, normal bowel sounds, no hepatosplenomegaly Extremities: no clubbing, cyanosis or edema; 2+ radial, ulnar and brachial pulses bilaterally; 2+ right femoral, posterior tibial and dorsalis pedis pulses; 2+ left femoral, posterior tibial and dorsalis pedis pulses; no subclavian or femoral bruits Neurological: grossly nonfocal Psych: Normal mood and affect   ASSESSMENT:    1. Chest pain of uncertain etiology   2. Palpitations   3. Hypercholesterolemia     PLAN:    In order of problems listed above:  Palpitations: Episodes are brief and infrequent.  Taking nadolol  as needed only.  Event monitor showed ectopic atrial tachycardia. No significant structural abnormalities. Continue current care. HLP: Borderline indication for lipid-lowering therapy.  Only other coronary risk factor is family history (not at a young age anyway).  Reluctant to start empirical statin therapy due to baseline problems with musculoskeletal pain and recent issues with abnormal liver function tests.  Check calcium score. L shoulder pain: symptoms strongly suggest musculoskeletal etiology.    Medication Adjustments/Labs and Tests Ordered: Current medicines are reviewed at length with the patient today.  Concerns regarding medicines are outlined above.  Orders Placed This Encounter  Procedures   EKG 12-Lead   No orders of the defined types were placed in  this encounter.   There are no Patient Instructions on file for this visit.   Signed, Jerel Balding, MD  07/07/2024 3:16 PM    [facility]

## 2024-07-07 NOTE — Patient Instructions (Signed)
 Medication Instructions:  No changes *If you need a refill on your cardiac medications before your next appointment, please call your pharmacy*  Lab Work: None ordered If you have labs (blood work) drawn today and your tests are completely normal, you will receive your results only by: MyChart Message (if you have MyChart) OR A paper copy in the mail If you have any lab test that is abnormal or we need to change your treatment, we will call you to review the results.  Testing/Procedures: Your physician has requested that you have an echocardiogram. Echocardiography is a painless test that uses sound waves to create images of your heart. It provides your doctor with information about the size and shape of your heart and how well your heart's chambers and valves are working. This procedure takes approximately one hour. There are no restrictions for this procedure. Please do NOT wear cologne, perfume, aftershave, or lotions (deodorant is allowed). Please arrive 15 minutes prior to your appointment time.  Please note: We ask at that you not bring children with you during ultrasound (echo/ vascular) testing. Due to room size and safety concerns, children are not allowed in the ultrasound rooms during exams. Our front office staff cannot provide observation of children in our lobby area while testing is being conducted. An adult accompanying a patient to their appointment will only be allowed in the ultrasound room at the discretion of the ultrasound technician under special circumstances. We apologize for any inconvenience.   Follow-Up: At [facility], you and your health needs are our priority.  As part of our continuing mission to provide you with exceptional heart care, our providers are all part of one team.  This team includes your primary Cardiologist (physician) and Advanced Practice Providers or APPs (Physician Assistants and Nurse Practitioners) who all work together to provide you  with the care you need, when you need it.  Your next appointment:   1 year(s)  Provider:   Jerel Balding, MD    We recommend signing up for the patient portal called MyChart.  Sign up information is provided on this After Visit Summary.  MyChart is used to connect with patients for Virtual Visits (Telemedicine).  Patients are able to view lab/test results, encounter notes, upcoming appointments, etc.  Non-urgent messages can be sent to your provider as well.   To learn more about what you can do with MyChart, go to [email or web address].

## 2024-07-08 DIAGNOSIS — H43811 Vitreous degeneration, right eye: Secondary | ICD-10-CM | POA: Diagnosis not present

## 2024-07-08 DIAGNOSIS — H2513 Age-related nuclear cataract, bilateral: Secondary | ICD-10-CM | POA: Diagnosis not present

## 2024-07-08 DIAGNOSIS — H5213 Myopia, bilateral: Secondary | ICD-10-CM | POA: Diagnosis not present

## 2024-07-08 DIAGNOSIS — H04123 Dry eye syndrome of bilateral lacrimal glands: Secondary | ICD-10-CM | POA: Diagnosis not present

## 2024-07-08 DIAGNOSIS — H524 Presbyopia: Secondary | ICD-10-CM | POA: Diagnosis not present

## 2024-07-12 ENCOUNTER — Encounter: Payer: Self-pay | Admitting: Cardiovascular Disease

## 2024-07-22 NOTE — Progress Notes (Unsigned)
" ° °  ANNUAL EXAM Patient name: Brianna [name] MRN [id number]  Date of birth: [birth year] Chief Complaint:   No chief complaint on file.  History of Present Illness:   Brianna [name] is a 58 y.o. G55P0010 Caucasian female being seen today for a routine annual exam.  Current complaints: ***  Patient's last menstrual period was 12/22/2012.   The pregnancy intention screening data noted above was reviewed. Potential methods of contraception were discussed. The patient elected to proceed with No data recorded.   Last pap 07/16/2023. Results were: NILM w/ HRHPV negative. H/O abnormal pap: {yes/yes***/no:23866} Last mammogram: 06/02/2024. Results were: normal. Family h/o breast cancer: {yes***/no:23838} Last colonoscopy: 09/24/2015. Results were: normal. Family h/o colorectal cancer: {yes***/no:23838}     07/16/2023    8:21 AM 06/26/2022    8:42 AM 06/20/2021    9:09 AM  Depression screen PHQ 2/9  Decreased Interest 0 0 0  Down, Depressed, Hopeless 0 0 0  PHQ - 2 Score 0 0 0         No data to display           Review of Systems:   Pertinent items are noted in HPI Denies any headaches, blurred vision, fatigue, shortness of breath, chest pain, abdominal pain, abnormal vaginal discharge/itching/odor/irritation, problems with periods, bowel movements, urination, or intercourse unless otherwise stated above. Pertinent History Reviewed:  Reviewed past medical,surgical, social and family history.  Reviewed problem list, medications and allergies. Physical Assessment:  There were no vitals filed for this visit.There is no height or weight on file to calculate BMI.        Physical Examination:   General appearance - well appearing, and in no distress  Mental status - alert, oriented to person, place, and time  Psych:  She has a normal mood and affect  Skin - warm and dry, normal color, no suspicious lesions noted  Chest - effort normal, all lung fields clear to auscultation  bilaterally  Heart - normal rate and regular rhythm  Neck:  midline trachea, no thyromegaly or nodules  Breasts - breasts appear normal, no suspicious masses, no skin or nipple changes or  axillary nodes  Abdomen - soft, nontender, nondistended, no masses or organomegaly  Pelvic - VULVA: normal appearing vulva with no masses, tenderness or lesions  VAGINA: normal appearing vagina with normal color and discharge, no lesions  CERVIX: normal appearing cervix without discharge or lesions, no CMT  Thin prep pap is {Desc; done/not:10129} *** HR HPV cotesting  UTERUS: uterus is felt to be normal size, shape, consistency and nontender   ADNEXA: No adnexal masses or tenderness noted.  Rectal - normal rectal, good sphincter tone, no masses felt. Hemoccult: ***  Extremities:  No swelling or varicosities noted  Chaperone present for exam  No results found for this or any previous visit (from the past 24 hours).  Assessment & Plan:  1) Well-Woman Exam  2) ***  Labs/procedures today: ***  Mammogram: {Mammo f/u:25212::@ 60yo}, or sooner if problems Colonoscopy: {TCS f/u:25213::@ 60yo}, or sooner if problems  No orders of the defined types were placed in this encounter.   Meds: No orders of the defined types were placed in this encounter.   Follow-up: No follow-ups on file.  Morna LOISE Quale, RN 07/22/2024 9:03 AM "

## 2024-07-23 ENCOUNTER — Ambulatory Visit (INDEPENDENT_AMBULATORY_CARE_PROVIDER_SITE_OTHER): Payer: 59 | Admitting: Obstetrics & Gynecology

## 2024-07-23 ENCOUNTER — Other Ambulatory Visit (HOSPITAL_COMMUNITY)
Admission: RE | Admit: 2024-07-23 | Discharge: 2024-07-23 | Disposition: A | Discharge status: Home / Self Care | Source: Ambulatory Visit | Attending: Obstetrics & Gynecology | Admitting: Obstetrics & Gynecology

## 2024-07-23 ENCOUNTER — Encounter (HOSPITAL_BASED_OUTPATIENT_CLINIC_OR_DEPARTMENT_OTHER): Payer: Self-pay | Admitting: Obstetrics & Gynecology

## 2024-07-23 VITALS — BP 118/64 | HR 57 | Ht 69.0 in | Wt 197.0 lb

## 2024-07-23 DIAGNOSIS — E559 Vitamin D deficiency, unspecified: Secondary | ICD-10-CM | POA: Diagnosis not present

## 2024-07-23 DIAGNOSIS — Z124 Encounter for screening for malignant neoplasm of cervix: Secondary | ICD-10-CM | POA: Insufficient documentation

## 2024-07-23 DIAGNOSIS — Z01419 Encounter for gynecological examination (general) (routine) without abnormal findings: Secondary | ICD-10-CM | POA: Diagnosis not present

## 2024-07-23 DIAGNOSIS — Z6829 Body mass index (BMI) 29.0-29.9, adult: Secondary | ICD-10-CM

## 2024-07-23 DIAGNOSIS — E785 Hyperlipidemia, unspecified: Secondary | ICD-10-CM | POA: Diagnosis not present

## 2024-07-23 DIAGNOSIS — Z1331 Encounter for screening for depression: Secondary | ICD-10-CM | POA: Diagnosis not present

## 2024-07-23 DIAGNOSIS — R748 Abnormal levels of other serum enzymes: Secondary | ICD-10-CM

## 2024-07-24 LAB — LIPID PANEL
Chol/HDL Ratio: 2.8 ratio (ref 0.0–4.4)
Cholesterol, Total: 226 mg/dL — ABNORMAL HIGH (ref 100–199)
HDL: 80 mg/dL
LDL Chol Calc (NIH): 138 mg/dL — ABNORMAL HIGH (ref 0–99)
Triglycerides: 49 mg/dL (ref 0–149)
VLDL Cholesterol Cal: 8 mg/dL (ref 5–40)

## 2024-07-24 LAB — HEMOGLOBIN A1C
Est. average glucose Bld gHb Est-mCnc: 100 mg/dL
Hgb A1c MFr Bld: 5.1 % (ref 4.8–5.6)

## 2024-07-24 LAB — COMPREHENSIVE METABOLIC PANEL WITH GFR
ALT: 13 IU/L (ref 0–32)
AST: 17 IU/L (ref 0–40)
Albumin: 4.6 g/dL (ref 3.8–4.9)
Alkaline Phosphatase: 177 IU/L — ABNORMAL HIGH (ref 49–135)
BUN/Creatinine Ratio: 21 (ref 12–28)
BUN: 15 mg/dL (ref 8–27)
Bilirubin Total: 0.4 mg/dL (ref 0.0–1.2)
CO2: 25 mmol/L (ref 20–29)
Calcium: 9.6 mg/dL (ref 8.7–10.3)
Chloride: 106 mmol/L (ref 96–106)
Creatinine, Ser: 0.71 mg/dL (ref 0.57–1.00)
Globulin, Total: 2.3 g/dL (ref 1.5–4.5)
Glucose: 92 mg/dL (ref 70–99)
Potassium: 4.5 mmol/L (ref 3.5–5.2)
Sodium: 143 mmol/L (ref 134–144)
Total Protein: 6.9 g/dL (ref 6.0–8.5)
eGFR: 97 mL/min/1.73

## 2024-07-24 LAB — VITAMIN D 25 HYDROXY (VIT D DEFICIENCY, FRACTURES): Vit D, 25-Hydroxy: 41.7 ng/mL (ref 30.0–100.0)

## 2024-07-25 ENCOUNTER — Ambulatory Visit (HOSPITAL_BASED_OUTPATIENT_CLINIC_OR_DEPARTMENT_OTHER): Payer: Self-pay | Admitting: Obstetrics & Gynecology

## 2024-07-25 ENCOUNTER — Other Ambulatory Visit: Payer: Self-pay | Admitting: Cardiovascular Disease

## 2024-07-25 ENCOUNTER — Other Ambulatory Visit: Payer: Self-pay | Admitting: Physician Assistant

## 2024-07-25 NOTE — Telephone Encounter (Signed)
 Last Fill: 04/25/2024  UDS:06/10/2024 Left suggestive of tramadol  use.   Narc Agreement: 06/10/2024  Next Visit: 12/09/2024  Last Visit: 06/10/2024  Dx: Myofascial pain syndrome   Current Dose per office note on 06/10/2024: She takes tramadol  as needed for pain management.    Okay to refill Tramadol ?

## 2024-07-28 LAB — CYTOLOGY - PAP: Diagnosis: NEGATIVE

## 2024-07-29 ENCOUNTER — Ambulatory Visit (INDEPENDENT_AMBULATORY_CARE_PROVIDER_SITE_OTHER): Admitting: Student

## 2024-07-29 ENCOUNTER — Ambulatory Visit (INDEPENDENT_AMBULATORY_CARE_PROVIDER_SITE_OTHER)

## 2024-07-29 DIAGNOSIS — M25561 Pain in right knee: Secondary | ICD-10-CM

## 2024-07-29 DIAGNOSIS — M1711 Unilateral primary osteoarthritis, right knee: Secondary | ICD-10-CM

## 2024-07-29 NOTE — Progress Notes (Signed)
 "                               Chief Complaint: Right knee pain    Discussed the use of AI scribe software for clinical note transcription with the patient, who gave verbal consent to proceed.  History of Present Illness Brianna [name] is a 59 year old female with hip bursitis who presents for evaluation of right knee pain following a fall.  On December 7th, she slipped on a frosty deck and fell, with the right knee tucked behind her and direct impact to the tailbone. Initial knee pain was severe with inability to bear weight on the right leg for several days and marked restriction in knee mobility for about two weeks. Over the past month, she has regained some mobility and can now bear weight but has medial right knee pain with certain movements such as stepping into a car or twisting, which can be intense. Pain worsens especially after inactivity and gradually improves with movement, and she notes intermittent swelling. She denies prior right knee problems, instability, clicking, popping, or catching. Pain limits activity, but she remains ambulatory. She is limiting activity and uses muscle relaxants and pain medication prescribed for hip bursitis sparingly. She has meloxicam  from prior plantar fasciitis and plans to take it as needed. She has not used topical anti-inflammatories or had right knee injections. Her hip bursitis is aggravated by altered gait from the knee pain. She has had prior steroid injections to her hips and shoulder with variable relief, as well as injections for plantar fasciitis and lateral epicondylitis.   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 Dolphus   Obesity    Osteopenia    Plantar fasciitis    Seizures (HCC)    Tendonitis of elbow, right    Past Surgical History:  Procedure Laterality Date   CESAREAN SECTION  1997, 2000   CHOLECYSTECTOMY N/A 07/03/2022   Procedure: LAPAROSCOPIC CHOLECYSTECTOMY;  Surgeon: Signe Mitzie LABOR, MD;  Location: [facility];  Service: General;  Laterality: N/A;   COLONOSCOPY  05/30/2024   TEMPOROMANDIBULAR JOINT SURGERY Left 1988   WISDOM TOOTH EXTRACTION     Social History   Socioeconomic History   Marital status: Married    Spouse name: Ozell   Number of children: 2   Years of education: Not on file   Highest education level: Associate degree: academic program  Occupational History   Occupation: Accounting  Tobacco Use   Smoking status: Never    Passive exposure: Past   Smokeless tobacco: Never  Vaping Use   Vaping status: Never Used  Substance and Sexual Activity   Alcohol use: Not Currently    Comment: rarely   Drug use: No   Sexual activity: Yes    Partners: Male    Birth control/protection: Other-see comments, Post-menopausal  Comment: vasectomy  Other Topics Concern   Not on file  Social History Narrative   ** Merged History Encounter **       Patient is right-handed. She lives with her husband in a 2 level home. She drinks tea 1-2 x a day. And an occasional soda. She walks most days for 30 minutes.   Social Drivers of Health   Tobacco Use: Low Risk (07/23/2024)   Patient History    Smoking Tobacco Use: Never    Smokeless Tobacco Use: Never    Passive Exposure: Past  Financial Resource Strain: Not on file  Food Insecurity: Not on file  Transportation Needs: Not on file  Physical Activity: Not on file  Stress: Not on file  Social Connections: Not on file  Depression (PHQ2-9): Low Risk (07/23/2024)   Depression (PHQ2-9)    PHQ-2  Score: 0  Alcohol Screen: Not on file  Housing: Not on file  Utilities: Not on file  Health Literacy: Not on file   Family History  Problem Relation Age of Onset   COPD Mother    Rheum arthritis Mother    Hyperlipidemia Mother    Heart attack Mother        01/2016   Colon polyps Mother    Factor V Leiden deficiency Mother    Diabetes Father    Hypertension Father    Hyperlipidemia Father    Parkinson's disease Father    Colon polyps Sister 63       pre-cancerous polyps-had surgery   Heart disease Sister    Graves' disease Sister    Hashimoto's thyroiditis Sister    Factor V Leiden deficiency Sister    Breast cancer Maternal Aunt    Stomach cancer Paternal Aunt    Factor V Leiden deficiency Son    Hyperparathyroidism Neg Hx    Allergies[1] Current Outpatient Medications  Medication Sig Dispense Refill   acetaminophen  (TYLENOL ) 500 MG tablet Take 1,000 mg by mouth every 8 (eight) hours as needed for moderate pain. (Patient not taking: Reported on 07/07/2024)     Atogepant (QULIPTA) 60 MG TABS Take 60 mg by mouth daily.     azelastine (ASTELIN) 0.1 % nasal spray Place 1 spray into both nostrils 2 (two) times daily.     Coenzyme Q10 (CO Q 10) 100 MG CAPS Take 100 mg by mouth daily.     fluticasone  (FLONASE ) 50 MCG/ACT nasal spray Place 1 spray into both nostrils daily.     ibuprofen (ADVIL) 200 MG tablet Take 400 mg by mouth every 8 (eight) hours as needed (pain).     levocetirizine (XYZAL) 5 MG tablet Take 5 mg by mouth every evening.     Magnesium 500 MG CAPS Take 500 mg by mouth daily.     methocarbamol  (ROBAXIN ) 500 MG tablet TAKE 1 TABLET BY MOUTH AT 7 AM AND AT 2 PM AS NEEDED (Patient not taking: Reported on 07/07/2024) 60 tablet 0   Misc Natural Products (TART CHERRY ADVANCED PO) Take 1 capsule by mouth daily.     montelukast (SINGULAIR) 10 MG tablet Take 10 mg by mouth daily.     Multiple Vitamins-Minerals (MULTIVITAMIN WITH MINERALS) tablet Take 1 tablet by mouth  daily.     nadolol  (CORGARD ) 40 MG tablet TAKE 1 TABLET BY MOUTH EVERY DAY AS NEEDED FOR HEART OR PALPITATIONS 90 tablet 3   omeprazole (PRILOSEC) 20 MG capsule Take 20 mg by mouth daily as needed (acid reflux). (Patient not taking: Reported on 07/07/2024)  Probiotic Product (PROBIOTIC DAILY PO) Take 1 capsule by mouth daily.     traMADol  (ULTRAM ) 50 MG tablet Take 1 tablet (50 mg total) by mouth daily as needed. 30 tablet 0   triamcinolone  cream (KENALOG ) 0.1 % Apply 1 Application topically 2 (two) times daily as needed.     vitamin B-12 (CYANOCOBALAMIN) 500 MCG tablet Take 500 mcg by mouth daily.     Vitamin D , Ergocalciferol , (DRISDOL ) 1.25 MG (50000 UNIT) CAPS capsule Take 1 capsule (50,000 Units total) by mouth every 14 (fourteen) days. 6 capsule 4   No current facility-administered medications for this visit.   No results found.  Review of Systems:   A ROS was performed including pertinent positives and negatives as documented in the HPI.  Physical Exam :   Constitutional: NAD and appears stated age Neurological: Alert and oriented Psych: Appropriate affect and cooperative Last menstrual period 12/22/2012.   Comprehensive Musculoskeletal Exam:    Exam of the right knee demonstrates tenderness over the medial joint line and MCL distribution.  Active range of motion is from 0 to 120 degrees.  No pain or laxity with varus or valgus stress.  No significant effusion, erythema, or warmth.  Imaging:   Xray (right knee 4 views): Mild to moderate medial joint space narrowing with osteophyte formation.  Mild patellofemoral osteoarthritis.  No evidence of acute bony abnormalities.   I personally reviewed and interpreted the radiographs.      Assessment & Plan Right knee osteoarthritis and medial collateral ligament (MCL) sprain Symptoms are due to mild to moderate medial compartment osteoarthritis and a mild MCL sprain, with no severe ligamentous injury or instability.  Conservative management is appropriate as symptoms have improved but remain bothersome. X-rays confirm mild to moderate medial compartment osteoarthritis with no acute bony injury. Recommended conservative management includes activity modification, use of a knee brace for stability, and engaging in low-impact exercises such as walking, pool activities, stationary biking, or using an elliptical. She should use anti-inflammatory medications or topical diclofenac  as tolerated and continue her current pain management regimen. A corticosteroid injection was discussed but deferred; she will monitor symptoms. She should watch for worsening symptoms such as increased instability, clicking, popping, or mechanical symptoms and return for further evaluation if these occur. Follow-up is advised if symptoms persist beyond a couple of weeks or if pain becomes more limiting, at which point a corticosteroid injection can be administered. She was instructed to contact the office for a meloxicam  refill if needed.      I personally saw and evaluated the patient, and participated in the management and treatment plan.  Leonce Reveal, PA-C Orthopedics     [1]  Allergies Allergen Reactions   Z-Pak [Azithromycin ]     GI upset   Amoxicillin Rash    Yeast Infection     Augmentin [Amoxicillin-Pot Clavulanate]     Yeast Infection    Sulfa Antibiotics Rash   "

## 2024-08-18 ENCOUNTER — Ambulatory Visit (HOSPITAL_COMMUNITY)

## 2024-09-12 ENCOUNTER — Ambulatory Visit (HOSPITAL_COMMUNITY)

## 2024-12-09 ENCOUNTER — Ambulatory Visit: Admitting: Rheumatology
